# Patient Record
Sex: Male | Born: 1960 | Race: Black or African American | Hispanic: No | Marital: Married | Smoking: Current every day smoker
Health system: Southern US, Community
[De-identification: ages and names within clinical notes are randomized; demographics above are authoritative.]

## PROBLEM LIST (undated history)

## (undated) DIAGNOSIS — R55 Syncope and collapse: Secondary | ICD-10-CM

## (undated) DIAGNOSIS — R42 Dizziness and giddiness: Secondary | ICD-10-CM

## (undated) DIAGNOSIS — I639 Cerebral infarction, unspecified: Secondary | ICD-10-CM

## (undated) DIAGNOSIS — R0602 Shortness of breath: Secondary | ICD-10-CM

## (undated) DIAGNOSIS — I10 Essential (primary) hypertension: Secondary | ICD-10-CM

## (undated) HISTORY — DX: Shortness of breath: R06.02

## (undated) HISTORY — DX: Dizziness and giddiness: R42

## (undated) HISTORY — DX: Syncope and collapse: R55

## (undated) HISTORY — DX: Essential (primary) hypertension: I10

---

## 1998-03-15 ENCOUNTER — Emergency Department (HOSPITAL_COMMUNITY): Admission: EM | Admit: 1998-03-15 | Discharge: 1998-03-15 | Payer: Self-pay | Admitting: Emergency Medicine

## 1999-08-01 ENCOUNTER — Encounter: Payer: Self-pay | Admitting: Emergency Medicine

## 1999-08-08 ENCOUNTER — Encounter: Admission: RE | Admit: 1999-08-08 | Discharge: 1999-08-08 | Payer: Self-pay | Admitting: Internal Medicine

## 1999-12-14 ENCOUNTER — Emergency Department (HOSPITAL_COMMUNITY): Admission: EM | Admit: 1999-12-14 | Discharge: 1999-12-14 | Payer: Self-pay | Admitting: Emergency Medicine

## 1999-12-14 ENCOUNTER — Encounter: Payer: Self-pay | Admitting: Emergency Medicine

## 2000-09-18 ENCOUNTER — Encounter: Admission: RE | Admit: 2000-09-18 | Discharge: 2000-09-18 | Payer: Self-pay | Admitting: Internal Medicine

## 2000-10-06 ENCOUNTER — Emergency Department (HOSPITAL_COMMUNITY): Admission: EM | Admit: 2000-10-06 | Discharge: 2000-10-06 | Payer: Self-pay | Admitting: Emergency Medicine

## 2000-11-13 ENCOUNTER — Inpatient Hospital Stay (HOSPITAL_COMMUNITY): Admission: EM | Admit: 2000-11-13 | Discharge: 2000-11-21 | Payer: Self-pay | Admitting: *Deleted

## 2000-11-13 ENCOUNTER — Encounter: Payer: Self-pay | Admitting: Emergency Medicine

## 2000-11-14 ENCOUNTER — Encounter: Payer: Self-pay | Admitting: Nephrology

## 2000-11-15 ENCOUNTER — Encounter: Payer: Self-pay | Admitting: General Surgery

## 2000-11-18 ENCOUNTER — Encounter: Payer: Self-pay | Admitting: General Surgery

## 2000-11-19 ENCOUNTER — Encounter: Payer: Self-pay | Admitting: General Surgery

## 2000-11-30 ENCOUNTER — Inpatient Hospital Stay (HOSPITAL_COMMUNITY): Admission: EM | Admit: 2000-11-30 | Discharge: 2000-12-06 | Payer: Self-pay | Admitting: Emergency Medicine

## 2000-11-30 ENCOUNTER — Encounter: Payer: Self-pay | Admitting: Emergency Medicine

## 2000-12-01 ENCOUNTER — Encounter: Payer: Self-pay | Admitting: General Surgery

## 2000-12-02 ENCOUNTER — Encounter: Payer: Self-pay | Admitting: Surgery

## 2000-12-03 ENCOUNTER — Encounter: Payer: Self-pay | Admitting: General Surgery

## 2000-12-04 ENCOUNTER — Encounter: Payer: Self-pay | Admitting: General Surgery

## 2000-12-06 ENCOUNTER — Encounter: Payer: Self-pay | Admitting: General Surgery

## 2000-12-26 ENCOUNTER — Emergency Department (HOSPITAL_COMMUNITY): Admission: EM | Admit: 2000-12-26 | Discharge: 2000-12-26 | Payer: Self-pay

## 2002-04-26 ENCOUNTER — Emergency Department (HOSPITAL_COMMUNITY): Admission: EM | Admit: 2002-04-26 | Discharge: 2002-04-26 | Payer: Self-pay | Admitting: Emergency Medicine

## 2002-04-26 ENCOUNTER — Encounter: Admission: RE | Admit: 2002-04-26 | Discharge: 2002-04-26 | Payer: Self-pay | Admitting: Internal Medicine

## 2002-05-26 ENCOUNTER — Encounter: Admission: RE | Admit: 2002-05-26 | Discharge: 2002-05-26 | Payer: Self-pay | Admitting: Internal Medicine

## 2002-11-02 ENCOUNTER — Emergency Department (HOSPITAL_COMMUNITY): Admission: EM | Admit: 2002-11-02 | Discharge: 2002-11-02 | Payer: Self-pay | Admitting: Emergency Medicine

## 2003-02-10 ENCOUNTER — Emergency Department (HOSPITAL_COMMUNITY): Admission: AD | Admit: 2003-02-10 | Discharge: 2003-02-10 | Payer: Self-pay | Admitting: Family Medicine

## 2003-04-18 ENCOUNTER — Encounter: Admission: RE | Admit: 2003-04-18 | Discharge: 2003-04-18 | Payer: Self-pay | Admitting: Internal Medicine

## 2003-04-24 ENCOUNTER — Encounter: Admission: RE | Admit: 2003-04-24 | Discharge: 2003-04-24 | Payer: Self-pay | Admitting: Internal Medicine

## 2004-05-23 ENCOUNTER — Emergency Department (HOSPITAL_COMMUNITY): Admission: EM | Admit: 2004-05-23 | Discharge: 2004-05-23 | Payer: Self-pay | Admitting: Emergency Medicine

## 2004-10-31 ENCOUNTER — Emergency Department (HOSPITAL_COMMUNITY): Admission: EM | Admit: 2004-10-31 | Discharge: 2004-10-31 | Payer: Self-pay | Admitting: Emergency Medicine

## 2005-02-24 ENCOUNTER — Ambulatory Visit: Payer: Self-pay | Admitting: Internal Medicine

## 2005-03-06 ENCOUNTER — Ambulatory Visit: Payer: Self-pay | Admitting: Internal Medicine

## 2005-05-06 ENCOUNTER — Ambulatory Visit: Payer: Self-pay | Admitting: Internal Medicine

## 2005-05-12 ENCOUNTER — Emergency Department (HOSPITAL_COMMUNITY): Admission: EM | Admit: 2005-05-12 | Discharge: 2005-05-12 | Payer: Self-pay | Admitting: Family Medicine

## 2005-05-20 ENCOUNTER — Emergency Department (HOSPITAL_COMMUNITY): Admission: EM | Admit: 2005-05-20 | Discharge: 2005-05-20 | Payer: Self-pay | Admitting: Family Medicine

## 2005-05-30 ENCOUNTER — Ambulatory Visit: Payer: Self-pay | Admitting: Internal Medicine

## 2005-05-30 ENCOUNTER — Encounter (INDEPENDENT_AMBULATORY_CARE_PROVIDER_SITE_OTHER): Payer: Self-pay | Admitting: Internal Medicine

## 2005-05-30 LAB — CONVERTED CEMR LAB
Calcium: 10.1 mg/dL
Creatinine, Ser: 1.2 mg/dL
Glucose, Bld: 89 mg/dL

## 2006-03-01 ENCOUNTER — Emergency Department (HOSPITAL_COMMUNITY): Admission: EM | Admit: 2006-03-01 | Discharge: 2006-03-01 | Payer: Self-pay | Admitting: Emergency Medicine

## 2006-03-18 ENCOUNTER — Emergency Department (HOSPITAL_COMMUNITY): Admission: EM | Admit: 2006-03-18 | Discharge: 2006-03-18 | Payer: Self-pay | Admitting: Family Medicine

## 2006-04-01 ENCOUNTER — Emergency Department (HOSPITAL_COMMUNITY): Admission: EM | Admit: 2006-04-01 | Discharge: 2006-04-01 | Payer: Self-pay | Admitting: Emergency Medicine

## 2006-04-02 ENCOUNTER — Ambulatory Visit: Payer: Self-pay | Admitting: Internal Medicine

## 2006-04-02 ENCOUNTER — Ambulatory Visit: Payer: Self-pay | Admitting: *Deleted

## 2006-04-02 ENCOUNTER — Observation Stay (HOSPITAL_COMMUNITY): Admission: AD | Admit: 2006-04-02 | Discharge: 2006-04-03 | Payer: Self-pay | Admitting: Internal Medicine

## 2006-04-10 ENCOUNTER — Encounter (INDEPENDENT_AMBULATORY_CARE_PROVIDER_SITE_OTHER): Payer: Self-pay | Admitting: Internal Medicine

## 2006-04-15 ENCOUNTER — Telehealth (INDEPENDENT_AMBULATORY_CARE_PROVIDER_SITE_OTHER): Payer: Self-pay | Admitting: Internal Medicine

## 2006-04-15 ENCOUNTER — Inpatient Hospital Stay (HOSPITAL_COMMUNITY): Admission: EM | Admit: 2006-04-15 | Discharge: 2006-04-17 | Payer: Self-pay | Admitting: Nurse Practitioner

## 2006-04-17 ENCOUNTER — Encounter (INDEPENDENT_AMBULATORY_CARE_PROVIDER_SITE_OTHER): Payer: Self-pay | Admitting: Internal Medicine

## 2006-04-17 LAB — CONVERTED CEMR LAB
Anion Gap: 6
BUN: 9 mg/dL
CO2: 28 meq/L
Calcium: 9.5 mg/dL
Chloride: 103 meq/L
Creatinine, Ser: 1 mg/dL
Glucose, Bld: 80 mg/dL
Potassium: 3.6 meq/L
Sodium: 137 meq/L

## 2006-04-20 ENCOUNTER — Encounter (INDEPENDENT_AMBULATORY_CARE_PROVIDER_SITE_OTHER): Payer: Self-pay | Admitting: Internal Medicine

## 2006-04-20 DIAGNOSIS — I10 Essential (primary) hypertension: Secondary | ICD-10-CM | POA: Insufficient documentation

## 2006-04-20 DIAGNOSIS — F17200 Nicotine dependence, unspecified, uncomplicated: Secondary | ICD-10-CM | POA: Insufficient documentation

## 2006-04-20 DIAGNOSIS — F1010 Alcohol abuse, uncomplicated: Secondary | ICD-10-CM

## 2006-11-21 ENCOUNTER — Emergency Department (HOSPITAL_COMMUNITY): Admission: EM | Admit: 2006-11-21 | Discharge: 2006-11-21 | Payer: Self-pay | Admitting: Emergency Medicine

## 2007-03-08 ENCOUNTER — Telehealth (INDEPENDENT_AMBULATORY_CARE_PROVIDER_SITE_OTHER): Payer: Self-pay | Admitting: *Deleted

## 2007-10-12 ENCOUNTER — Emergency Department (HOSPITAL_COMMUNITY): Admission: EM | Admit: 2007-10-12 | Discharge: 2007-10-12 | Payer: Self-pay | Admitting: Emergency Medicine

## 2007-10-20 ENCOUNTER — Emergency Department (HOSPITAL_COMMUNITY): Admission: EM | Admit: 2007-10-20 | Discharge: 2007-10-20 | Payer: Self-pay | Admitting: Emergency Medicine

## 2007-11-25 ENCOUNTER — Emergency Department (HOSPITAL_COMMUNITY): Admission: EM | Admit: 2007-11-25 | Discharge: 2007-11-25 | Payer: Self-pay | Admitting: Emergency Medicine

## 2008-01-13 ENCOUNTER — Ambulatory Visit: Payer: Self-pay | Admitting: Family Medicine

## 2008-05-16 ENCOUNTER — Emergency Department (HOSPITAL_COMMUNITY): Admission: EM | Admit: 2008-05-16 | Discharge: 2008-05-17 | Payer: Self-pay | Admitting: Emergency Medicine

## 2008-06-23 ENCOUNTER — Emergency Department (HOSPITAL_COMMUNITY): Admission: EM | Admit: 2008-06-23 | Discharge: 2008-06-23 | Payer: Self-pay | Admitting: Emergency Medicine

## 2008-12-22 ENCOUNTER — Emergency Department (HOSPITAL_COMMUNITY): Admission: EM | Admit: 2008-12-22 | Discharge: 2008-12-22 | Payer: Self-pay | Admitting: Emergency Medicine

## 2009-02-19 ENCOUNTER — Emergency Department (HOSPITAL_COMMUNITY): Admission: EM | Admit: 2009-02-19 | Discharge: 2009-02-19 | Payer: Self-pay | Admitting: Emergency Medicine

## 2009-02-20 ENCOUNTER — Observation Stay (HOSPITAL_COMMUNITY): Admission: EM | Admit: 2009-02-20 | Discharge: 2009-02-22 | Payer: Self-pay | Admitting: Emergency Medicine

## 2009-02-20 ENCOUNTER — Ambulatory Visit: Payer: Self-pay | Admitting: Infectious Diseases

## 2009-02-20 ENCOUNTER — Ambulatory Visit: Payer: Self-pay | Admitting: Cardiology

## 2009-02-21 ENCOUNTER — Encounter: Payer: Self-pay | Admitting: Infectious Diseases

## 2009-02-22 ENCOUNTER — Encounter: Payer: Self-pay | Admitting: Internal Medicine

## 2009-03-27 ENCOUNTER — Ambulatory Visit: Payer: Self-pay | Admitting: Family Medicine

## 2009-03-27 LAB — CONVERTED CEMR LAB: Microalb, Ur: 6.22 mg/dL — ABNORMAL HIGH (ref 0.00–1.89)

## 2009-08-24 ENCOUNTER — Emergency Department (HOSPITAL_COMMUNITY): Admission: EM | Admit: 2009-08-24 | Discharge: 2009-08-24 | Payer: Self-pay | Admitting: Emergency Medicine

## 2009-09-05 ENCOUNTER — Inpatient Hospital Stay (HOSPITAL_COMMUNITY): Admission: EM | Admit: 2009-09-05 | Discharge: 2009-09-07 | Payer: Self-pay | Admitting: Emergency Medicine

## 2009-09-06 ENCOUNTER — Encounter (INDEPENDENT_AMBULATORY_CARE_PROVIDER_SITE_OTHER): Payer: Self-pay | Admitting: Internal Medicine

## 2009-10-17 ENCOUNTER — Inpatient Hospital Stay (HOSPITAL_COMMUNITY): Admission: EM | Admit: 2009-10-17 | Discharge: 2009-10-18 | Payer: Self-pay | Admitting: Emergency Medicine

## 2009-12-10 ENCOUNTER — Emergency Department (HOSPITAL_COMMUNITY): Admission: EM | Admit: 2009-12-10 | Discharge: 2009-12-11 | Payer: Self-pay | Admitting: Emergency Medicine

## 2009-12-19 ENCOUNTER — Emergency Department (HOSPITAL_COMMUNITY): Admission: EM | Admit: 2009-12-19 | Discharge: 2009-12-19 | Payer: Self-pay | Admitting: Emergency Medicine

## 2009-12-25 ENCOUNTER — Emergency Department (HOSPITAL_COMMUNITY): Admission: EM | Admit: 2009-12-25 | Discharge: 2009-12-26 | Payer: Self-pay | Admitting: Emergency Medicine

## 2009-12-26 ENCOUNTER — Emergency Department (HOSPITAL_COMMUNITY): Admission: EM | Admit: 2009-12-26 | Discharge: 2009-12-26 | Payer: Self-pay | Admitting: Emergency Medicine

## 2010-01-11 ENCOUNTER — Emergency Department (HOSPITAL_COMMUNITY): Admission: EM | Admit: 2010-01-11 | Discharge: 2010-01-11 | Payer: Self-pay | Admitting: Emergency Medicine

## 2010-06-05 LAB — COMPREHENSIVE METABOLIC PANEL
ALT: 77 U/L — ABNORMAL HIGH (ref 0–53)
AST: 81 U/L — ABNORMAL HIGH (ref 0–37)
Albumin: 3.5 g/dL (ref 3.5–5.2)
Alkaline Phosphatase: 78 U/L (ref 39–117)
BUN: 3 mg/dL — ABNORMAL LOW (ref 6–23)
BUN: 4 mg/dL — ABNORMAL LOW (ref 6–23)
CO2: 29 mEq/L (ref 19–32)
CO2: 29 mEq/L (ref 19–32)
Calcium: 8.9 mg/dL (ref 8.4–10.5)
Calcium: 9.5 mg/dL (ref 8.4–10.5)
Chloride: 99 mEq/L (ref 96–112)
Creatinine, Ser: 0.91 mg/dL (ref 0.4–1.5)
Creatinine, Ser: 0.94 mg/dL (ref 0.4–1.5)
GFR calc Af Amer: >60 mL/min (ref >60)
GFR calc non Af Amer: >60 mL/min (ref >60)
GFR calc non Af Amer: >60 mL/min (ref >60)
Glucose, Bld: 110 mg/dL — ABNORMAL HIGH (ref 70–99)
Glucose, Bld: 121 mg/dL — ABNORMAL HIGH (ref 70–99)
Glucose, Bld: 97 mg/dL (ref 70–99)
Potassium: 3.2 mEq/L — ABNORMAL LOW (ref 3.5–5.1)
Sodium: 135 mEq/L (ref 135–145)
Total Bilirubin: 0.8 mg/dL (ref 0.3–1.2)
Total Protein: 7.8 g/dL (ref 6.0–8.3)
Total Protein: 8.6 g/dL — ABNORMAL HIGH (ref 6.0–8.3)

## 2010-06-05 LAB — URINALYSIS, ROUTINE W REFLEX MICROSCOPIC
Glucose, UA: NEGATIVE mg/dL
Ketones, ur: NEGATIVE mg/dL
Leukocytes, UA: NEGATIVE
Nitrite: NEGATIVE
Protein, ur: 30 mg/dL — AB

## 2010-06-05 LAB — DIFFERENTIAL
Basophils Absolute: 0 10*3/uL (ref 0.0–0.1)
Basophils Relative: 0 % (ref 0–1)
Basophils Relative: 0 % (ref 0–1)
Basophils Relative: 1 % (ref 0–1)
Eosinophils Absolute: 0 10*3/uL (ref 0.0–0.7)
Eosinophils Absolute: 0 10*3/uL (ref 0.0–0.7)
Eosinophils Relative: 1 % (ref 0–5)
Eosinophils Relative: 1 % (ref 0–5)
Lymphocytes Relative: 32 % (ref 12–46)
Lymphocytes Relative: 33 % (ref 12–46)
Lymphs Abs: 1.1 10*3/uL (ref 0.7–4.0)
Lymphs Abs: 1.5 10*3/uL (ref 0.7–4.0)
Monocytes Absolute: 0.3 10*3/uL (ref 0.1–1.0)
Monocytes Absolute: 0.3 10*3/uL (ref 0.1–1.0)
Monocytes Relative: 11 % (ref 3–12)
Monocytes Relative: 10 % (ref 3–12)
Monocytes Relative: 9 % (ref 3–12)
Neutro Abs: 1.4 10*3/uL — ABNORMAL LOW (ref 1.7–7.7)
Neutro Abs: 1.9 10*3/uL (ref 1.7–7.7)
Neutrophils Relative %: 56 % (ref 43–77)
Neutrophils Relative %: 43 % (ref 43–77)
Neutrophils Relative %: 58 % (ref 43–77)
Neutrophils Relative %: 61 % (ref 43–77)

## 2010-06-05 LAB — CBC
HCT: 46.6 % (ref 39.0–52.0)
HCT: 43.4 % (ref 39.0–52.0)
HCT: 45.7 % (ref 39.0–52.0)
HCT: 46 % (ref 39.0–52.0)
Hemoglobin: 16.3 g/dL (ref 13.0–17.0)
MCH: 32.2 pg (ref 26.0–34.0)
MCH: 32.5 pg (ref 26.0–34.0)
MCH: 32.6 pg (ref 26.0–34.0)
MCH: 32.6 pg (ref 26.0–34.0)
MCHC: 35.9 g/dL (ref 30.0–36.0)
MCHC: 35.9 g/dL (ref 30.0–36.0)
MCV: 89.4 fL (ref 78.0–100.0)
MCV: 90.5 fL (ref 78.0–100.0)
MCV: 90.8 fL (ref 78.0–100.0)
Platelets: 212 10*3/uL (ref 150–400)
Platelets: 195 10*3/uL (ref 150–400)
RBC: 5.21 MIL/uL (ref 4.22–5.81)
RBC: 5 MIL/uL (ref 4.22–5.81)
RDW: 14.5 % (ref 11.5–15.5)
RDW: 14.6 % (ref 11.5–15.5)
WBC: 3 10*3/uL — ABNORMAL LOW (ref 4.0–10.5)
WBC: 3.3 10*3/uL — ABNORMAL LOW (ref 4.0–10.5)

## 2010-06-05 LAB — POCT I-STAT, CHEM 8
BUN: 5 mg/dL — ABNORMAL LOW (ref 6–23)
Calcium, Ion: 1.13 mmol/L (ref 1.12–1.32)
Chloride: 99 mEq/L (ref 96–112)
Chloride: 101 mEq/L (ref 96–112)
Creatinine, Ser: 1 mg/dL (ref 0.4–1.5)
Creatinine, Ser: 0.8 mg/dL (ref 0.4–1.5)
Glucose, Bld: 93 mg/dL (ref 70–99)
Glucose, Bld: 132 mg/dL — ABNORMAL HIGH (ref 70–99)
Hemoglobin: 15.6 g/dL (ref 13.0–17.0)
Potassium: 3.9 mEq/L (ref 3.5–5.1)
Sodium: 140 mEq/L (ref 135–145)

## 2010-06-05 LAB — LIPASE, BLOOD
Lipase: 21 U/L (ref 11–59)
Lipase: 24 U/L (ref 11–59)

## 2010-06-05 LAB — URINE MICROSCOPIC-ADD ON

## 2010-06-06 LAB — POCT I-STAT, CHEM 8
BUN: 6 mg/dL (ref 6–23)
Calcium, Ion: 1.12 mmol/L (ref 1.12–1.32)
HCT: 50 % (ref 39.0–52.0)
Hemoglobin: 17 g/dL (ref 13.0–17.0)
Sodium: 139 mEq/L (ref 135–145)
TCO2: 30 mmol/L (ref 0–100)
TCO2: 32 mmol/L (ref 0–100)

## 2010-06-06 LAB — DIFFERENTIAL
Basophils Absolute: 0 10*3/uL (ref 0.0–0.1)
Eosinophils Relative: 1 % (ref 0–5)
Lymphocytes Relative: 35 % (ref 12–46)
Neutro Abs: 1.2 10*3/uL — ABNORMAL LOW (ref 1.7–7.7)
Neutrophils Relative %: 49 % (ref 43–77)

## 2010-06-06 LAB — CBC
MCV: 94.6 fL (ref 78.0–100.0)
Platelets: 228 10*3/uL (ref 150–400)
RDW: 14.6 % (ref 11.5–15.5)
WBC: 2.4 10*3/uL — ABNORMAL LOW (ref 4.0–10.5)

## 2010-06-08 LAB — PHOSPHORUS: Phosphorus: 4.3 mg/dL (ref 2.3–4.6)

## 2010-06-08 LAB — POCT I-STAT, CHEM 8
Chloride: 104 mEq/L (ref 96–112)
Glucose, Bld: 126 mg/dL — ABNORMAL HIGH (ref 70–99)
HCT: 48 % (ref 39.0–52.0)
Hemoglobin: 16.3 g/dL (ref 13.0–17.0)
Potassium: 2.9 mEq/L — ABNORMAL LOW (ref 3.5–5.1)
Sodium: 140 mEq/L (ref 135–145)

## 2010-06-08 LAB — URINALYSIS, MICROSCOPIC ONLY
Glucose, UA: NEGATIVE mg/dL
Hgb urine dipstick: NEGATIVE
Ketones, ur: NEGATIVE mg/dL
Protein, ur: NEGATIVE mg/dL
Urobilinogen, UA: 1 mg/dL (ref 0.0–1.0)

## 2010-06-08 LAB — DIFFERENTIAL
Basophils Relative: 1 % (ref 0–1)
Eosinophils Absolute: 0.1 10*3/uL (ref 0.0–0.7)
Eosinophils Relative: 1 % (ref 0–5)
Lymphocytes Relative: 33 % (ref 12–46)
Lymphocytes Relative: 47 % — ABNORMAL HIGH (ref 12–46)
Lymphs Abs: 0.7 10*3/uL (ref 0.7–4.0)
Lymphs Abs: 1.1 10*3/uL (ref 0.7–4.0)
Lymphs Abs: 1.6 10*3/uL (ref 0.7–4.0)
Monocytes Relative: 6 % (ref 3–12)
Neutro Abs: 1.3 10*3/uL — ABNORMAL LOW (ref 1.7–7.7)
Neutrophils Relative %: 42 % — ABNORMAL LOW (ref 43–77)
Neutrophils Relative %: 53 % (ref 43–77)
Neutrophils Relative %: 61 % (ref 43–77)

## 2010-06-08 LAB — COMPREHENSIVE METABOLIC PANEL
ALT: 85 U/L — ABNORMAL HIGH (ref 0–53)
Alkaline Phosphatase: 55 U/L (ref 39–117)
CO2: 25 mEq/L (ref 19–32)
Calcium: 8.7 mg/dL (ref 8.4–10.5)
GFR calc non Af Amer: >60 mL/min (ref >60)
Glucose, Bld: 91 mg/dL (ref 70–99)
Sodium: 135 mEq/L (ref 135–145)

## 2010-06-08 LAB — CBC
HCT: 37.1 % — ABNORMAL LOW (ref 39.0–52.0)
Hemoglobin: 13 g/dL (ref 13.0–17.0)
Hemoglobin: 15.1 g/dL (ref 13.0–17.0)
MCHC: 35 g/dL (ref 30.0–36.0)
MCV: 95.8 fL (ref 78.0–100.0)
MCV: 96.1 fL (ref 78.0–100.0)
Platelets: 147 10*3/uL — ABNORMAL LOW (ref 150–400)
Platelets: 151 10*3/uL (ref 150–400)
RBC: 4.51 MIL/uL (ref 4.22–5.81)
RBC: 4.52 MIL/uL (ref 4.22–5.81)
WBC: 2.2 10*3/uL — ABNORMAL LOW (ref 4.0–10.5)
WBC: 3.5 10*3/uL — ABNORMAL LOW (ref 4.0–10.5)

## 2010-06-08 LAB — URINALYSIS, ROUTINE W REFLEX MICROSCOPIC
Hgb urine dipstick: NEGATIVE
Nitrite: NEGATIVE
Protein, ur: NEGATIVE mg/dL
Specific Gravity, Urine: 1.008 (ref 1.005–1.030)
Urobilinogen, UA: 0.2 mg/dL (ref 0.0–1.0)

## 2010-06-08 LAB — CARDIAC PANEL(CRET KIN+CKTOT+MB+TROPI)
CK, MB: 1.2 ng/mL (ref 0.3–4.0)
Relative Index: 1.3 (ref 0.0–2.5)
Relative Index: INVALID (ref 0.0–2.5)
Total CK: 72 U/L (ref 7–232)
Total CK: 87 U/L (ref 7–232)

## 2010-06-08 LAB — LIPID PANEL
LDL Cholesterol: 63 mg/dL (ref 0–99)
Triglycerides: 126 mg/dL (ref <150)

## 2010-06-08 LAB — RAPID URINE DRUG SCREEN, HOSP PERFORMED
Amphetamines: NOT DETECTED
Amphetamines: NOT DETECTED
Barbiturates: NOT DETECTED
Benzodiazepines: NOT DETECTED
Benzodiazepines: NOT DETECTED
Cocaine: NOT DETECTED
Opiates: POSITIVE — AB
Tetrahydrocannabinol: NOT DETECTED
Tetrahydrocannabinol: NOT DETECTED

## 2010-06-08 LAB — CK TOTAL AND CKMB (NOT AT ARMC)
Relative Index: INVALID (ref 0.0–2.5)
Total CK: 90 U/L (ref 7–232)

## 2010-06-08 LAB — HEPATITIS B SURFACE ANTIBODY,QUALITATIVE: Hep B S Ab: POSITIVE — AB

## 2010-06-08 LAB — HEPATITIS C ANTIBODY: HCV Ab: REACTIVE — AB

## 2010-06-08 LAB — LIPASE, BLOOD: Lipase: 105 U/L — ABNORMAL HIGH (ref 11–59)

## 2010-06-08 LAB — PROTIME-INR
INR: 0.98 (ref 0.00–1.49)
Prothrombin Time: 12.9 seconds (ref 11.6–15.2)

## 2010-06-08 LAB — HEMOGLOBIN A1C: Mean Plasma Glucose: 97 mg/dL (ref <117)

## 2010-06-08 LAB — GLUCOSE, CAPILLARY: Glucose-Capillary: 111 mg/dL — ABNORMAL HIGH (ref 70–99)

## 2010-06-08 LAB — MAGNESIUM
Magnesium: 1.7 mg/dL (ref 1.5–2.5)
Magnesium: 1.8 mg/dL (ref 1.5–2.5)
Magnesium: 1.9 mg/dL (ref 1.5–2.5)

## 2010-06-08 LAB — URINE CULTURE: Culture: NO GROWTH

## 2010-06-08 LAB — HEPATITIS B CORE ANTIBODY, TOTAL: Hep B Core Total Ab: NEGATIVE

## 2010-06-08 LAB — HEPATIC FUNCTION PANEL
ALT: 118 U/L — ABNORMAL HIGH (ref 0–53)
Albumin: 3.8 g/dL (ref 3.5–5.2)
Alkaline Phosphatase: 68 U/L (ref 39–117)
Total Protein: 8.2 g/dL (ref 6.0–8.3)

## 2010-06-08 LAB — POCT CARDIAC MARKERS: Myoglobin, poc: 38.9 ng/mL (ref 12–200)

## 2010-06-08 LAB — APTT: aPTT: 29 seconds (ref 24–37)

## 2010-06-09 LAB — DIFFERENTIAL
Eosinophils Absolute: 0.1 10*3/uL (ref 0.0–0.7)
Lymphocytes Relative: 47 % — ABNORMAL HIGH (ref 12–46)
Lymphs Abs: 1.9 10*3/uL (ref 0.7–4.0)
Monocytes Relative: 8 % (ref 3–12)
Neutrophils Relative %: 43 % (ref 43–77)

## 2010-06-09 LAB — COMPREHENSIVE METABOLIC PANEL
ALT: 101 U/L — ABNORMAL HIGH (ref 0–53)
Calcium: 8.5 mg/dL (ref 8.4–10.5)
Creatinine, Ser: 0.91 mg/dL (ref 0.4–1.5)
GFR calc Af Amer: >60 mL/min (ref >60)
Glucose, Bld: 87 mg/dL (ref 70–99)
Sodium: 136 mEq/L (ref 135–145)
Total Protein: 6.2 g/dL (ref 6.0–8.3)

## 2010-06-09 LAB — CBC
Hemoglobin: 13.2 g/dL (ref 13.0–17.0)
MCHC: 34.8 g/dL (ref 30.0–36.0)
RDW: 14.9 % (ref 11.5–15.5)

## 2010-06-09 LAB — TSH: TSH: 1.267 u[IU]/mL (ref 0.350–4.500)

## 2010-06-09 LAB — CARDIAC PANEL(CRET KIN+CKTOT+MB+TROPI)
Relative Index: 1.9 (ref 0.0–2.5)
Relative Index: INVALID (ref 0.0–2.5)
Total CK: 97 U/L (ref 7–232)

## 2010-06-10 LAB — POTASSIUM: Potassium: 3.3 mEq/L — ABNORMAL LOW (ref 3.5–5.1)

## 2010-06-10 LAB — URINALYSIS, ROUTINE W REFLEX MICROSCOPIC
Glucose, UA: NEGATIVE mg/dL
Ketones, ur: NEGATIVE mg/dL
Leukocytes, UA: NEGATIVE
Nitrite: NEGATIVE
Protein, ur: 100 mg/dL — AB
pH: 6.5 (ref 5.0–8.0)

## 2010-06-10 LAB — POCT I-STAT, CHEM 8
BUN: 8 mg/dL (ref 6–23)
Calcium, Ion: 1.09 mmol/L — ABNORMAL LOW (ref 1.12–1.32)
Chloride: 91 mEq/L — ABNORMAL LOW (ref 96–112)
Creatinine, Ser: 0.9 mg/dL (ref 0.4–1.5)
Creatinine, Ser: 0.9 mg/dL (ref 0.4–1.5)
Glucose, Bld: 102 mg/dL — ABNORMAL HIGH (ref 70–99)
Glucose, Bld: 146 mg/dL — ABNORMAL HIGH (ref 70–99)
HCT: 54 % — ABNORMAL HIGH (ref 39.0–52.0)
Hemoglobin: 16.3 g/dL (ref 13.0–17.0)
Hemoglobin: 18.4 g/dL — ABNORMAL HIGH (ref 13.0–17.0)
Potassium: 2.6 mEq/L — CL (ref 3.5–5.1)

## 2010-06-10 LAB — POCT CARDIAC MARKERS: Troponin i, poc: <0.05 ng/mL (ref 0.00–0.09)

## 2010-06-10 LAB — CBC
HCT: 49.1 % (ref 39.0–52.0)
Hemoglobin: 17.1 g/dL — ABNORMAL HIGH (ref 13.0–17.0)
MCV: 95.9 fL (ref 78.0–100.0)
RBC: 5.12 MIL/uL (ref 4.22–5.81)
WBC: 2.6 10*3/uL — ABNORMAL LOW (ref 4.0–10.5)

## 2010-06-10 LAB — HEPATIC FUNCTION PANEL
ALT: 167 U/L — ABNORMAL HIGH (ref 0–53)
AST: 204 U/L — ABNORMAL HIGH (ref 0–37)
Alkaline Phosphatase: 90 U/L (ref 39–117)
Bilirubin, Direct: 0.5 mg/dL — ABNORMAL HIGH (ref 0.0–0.3)
Indirect Bilirubin: 0.9 mg/dL (ref 0.3–0.9)

## 2010-06-10 LAB — ETHANOL: Alcohol, Ethyl (B): <5 mg/dL (ref 0–10)

## 2010-06-25 LAB — BASIC METABOLIC PANEL
BUN: 4 mg/dL — ABNORMAL LOW (ref 6–23)
BUN: 7 mg/dL (ref 6–23)
Calcium: 8.6 mg/dL (ref 8.4–10.5)
Creatinine, Ser: 0.87 mg/dL (ref 0.4–1.5)
GFR calc non Af Amer: >60 mL/min (ref >60)
GFR calc non Af Amer: >60 mL/min (ref >60)
Glucose, Bld: 85 mg/dL (ref 70–99)
Potassium: 3.1 mEq/L — ABNORMAL LOW (ref 3.5–5.1)

## 2010-06-25 LAB — LIPID PANEL
LDL Cholesterol: 85 mg/dL (ref 0–99)
Total CHOL/HDL Ratio: 3.7 RATIO
VLDL: 17 mg/dL (ref 0–40)

## 2010-06-25 LAB — CBC
HCT: 40.9 % (ref 39.0–52.0)
MCV: 95.8 fL (ref 78.0–100.0)
Platelets: 171 10*3/uL (ref 150–400)
RDW: 15.3 % (ref 11.5–15.5)

## 2010-06-25 LAB — CARDIAC PANEL(CRET KIN+CKTOT+MB+TROPI)
Relative Index: INVALID (ref 0.0–2.5)
Total CK: 86 U/L (ref 7–232)
Troponin I: 0.01 ng/mL (ref 0.00–0.06)

## 2010-06-26 LAB — CBC
HCT: 42.7 % (ref 39.0–52.0)
Hemoglobin: 15 g/dL (ref 13.0–17.0)
MCHC: 34.5 g/dL (ref 30.0–36.0)
MCV: 94.5 fL (ref 78.0–100.0)
Platelets: 180 10*3/uL (ref 150–400)
RBC: 4.51 MIL/uL (ref 4.22–5.81)
RBC: 4.88 MIL/uL (ref 4.22–5.81)
WBC: 3.5 10*3/uL — ABNORMAL LOW (ref 4.0–10.5)
WBC: 4.3 10*3/uL (ref 4.0–10.5)

## 2010-06-26 LAB — POCT I-STAT, CHEM 8
Chloride: 100 mEq/L (ref 96–112)
Creatinine, Ser: 1 mg/dL (ref 0.4–1.5)
Creatinine, Ser: 1 mg/dL (ref 0.4–1.5)
Glucose, Bld: 80 mg/dL (ref 70–99)
Glucose, Bld: 98 mg/dL (ref 70–99)
HCT: 49 % (ref 39.0–52.0)
Hemoglobin: 15.6 g/dL (ref 13.0–17.0)
Potassium: 3.1 mEq/L — ABNORMAL LOW (ref 3.5–5.1)
Sodium: 141 mEq/L (ref 135–145)
TCO2: 28 mmol/L (ref 0–100)

## 2010-06-26 LAB — COMPREHENSIVE METABOLIC PANEL
ALT: 47 U/L (ref 0–53)
AST: 31 U/L (ref 0–37)
Albumin: 4 g/dL (ref 3.5–5.2)
CO2: 26 mEq/L (ref 19–32)
Calcium: 9.4 mg/dL (ref 8.4–10.5)
Creatinine, Ser: 0.86 mg/dL (ref 0.4–1.5)
GFR calc Af Amer: >60 mL/min (ref >60)
GFR calc non Af Amer: >60 mL/min (ref >60)
Sodium: 136 mEq/L (ref 135–145)

## 2010-06-26 LAB — URINALYSIS, ROUTINE W REFLEX MICROSCOPIC
Glucose, UA: NEGATIVE mg/dL
Glucose, UA: NEGATIVE mg/dL
Hgb urine dipstick: NEGATIVE
Ketones, ur: 15 mg/dL — AB
Ketones, ur: NEGATIVE mg/dL
Nitrite: NEGATIVE
Protein, ur: NEGATIVE mg/dL
Urobilinogen, UA: 1 mg/dL (ref 0.0–1.0)
pH: 6.5 (ref 5.0–8.0)

## 2010-06-26 LAB — DIFFERENTIAL
Eosinophils Absolute: 0 10*3/uL (ref 0.0–0.7)
Eosinophils Absolute: 0.1 10*3/uL (ref 0.0–0.7)
Eosinophils Relative: 1 % (ref 0–5)
Eosinophils Relative: 2 % (ref 0–5)
Lymphocytes Relative: 43 % (ref 12–46)
Lymphs Abs: 1.2 10*3/uL (ref 0.7–4.0)
Lymphs Abs: 1.9 10*3/uL (ref 0.7–4.0)
Monocytes Absolute: 0.3 10*3/uL (ref 0.1–1.0)
Monocytes Absolute: 0.5 10*3/uL (ref 0.1–1.0)
Monocytes Relative: 10 % (ref 3–12)
Monocytes Relative: 11 % (ref 3–12)

## 2010-06-26 LAB — POCT CARDIAC MARKERS
CKMB, poc: 1.2 ng/mL (ref 1.0–8.0)
Myoglobin, poc: 53.8 ng/mL (ref 12–200)
Troponin i, poc: <0.05 ng/mL (ref 0.00–0.09)

## 2010-06-26 LAB — MICROALBUMIN / CREATININE URINE RATIO: Microalb, Ur: 8.42 mg/dL — ABNORMAL HIGH (ref 0.00–1.89)

## 2010-06-26 LAB — CARDIAC PANEL(CRET KIN+CKTOT+MB+TROPI)
Relative Index: 1.7 (ref 0.0–2.5)
Troponin I: 0.01 ng/mL (ref 0.00–0.06)

## 2010-06-26 LAB — RAPID URINE DRUG SCREEN, HOSP PERFORMED
Amphetamines: NOT DETECTED
Benzodiazepines: NOT DETECTED
Cocaine: NOT DETECTED
Tetrahydrocannabinol: NOT DETECTED

## 2010-06-26 LAB — GLUCOSE, CAPILLARY: Glucose-Capillary: 132 mg/dL — ABNORMAL HIGH (ref 70–99)

## 2010-06-27 LAB — COMPREHENSIVE METABOLIC PANEL
ALT: 199 U/L — ABNORMAL HIGH (ref 0–53)
AST: 132 U/L — ABNORMAL HIGH (ref 0–37)
Albumin: 4.4 g/dL (ref 3.5–5.2)
Alkaline Phosphatase: 69 U/L (ref 39–117)
GFR calc Af Amer: >60 mL/min (ref >60)
Potassium: 3.9 mEq/L (ref 3.5–5.1)
Sodium: 136 mEq/L (ref 135–145)
Total Protein: 8.7 g/dL — ABNORMAL HIGH (ref 6.0–8.3)

## 2010-06-27 LAB — POCT I-STAT, CHEM 8
Glucose, Bld: 95 mg/dL (ref 70–99)
HCT: 45 % (ref 39.0–52.0)
Hemoglobin: 15.3 g/dL (ref 13.0–17.0)
Potassium: 4 mEq/L (ref 3.5–5.1)
Sodium: 137 mEq/L (ref 135–145)
TCO2: 27 mmol/L (ref 0–100)

## 2010-06-27 LAB — URINE MICROSCOPIC-ADD ON

## 2010-06-27 LAB — DIFFERENTIAL
Basophils Relative: 0 % (ref 0–1)
Eosinophils Absolute: 0 10*3/uL (ref 0.0–0.7)
Eosinophils Relative: 0 % (ref 0–5)
Neutrophils Relative %: 79 % — ABNORMAL HIGH (ref 43–77)

## 2010-06-27 LAB — POCT CARDIAC MARKERS
CKMB, poc: 1.2 ng/mL (ref 1.0–8.0)
Myoglobin, poc: 79 ng/mL (ref 12–200)

## 2010-06-27 LAB — CBC
HCT: 42.7 % (ref 39.0–52.0)
MCHC: 34.7 g/dL (ref 30.0–36.0)
MCV: 91.4 fL (ref 78.0–100.0)
Platelets: 226 10*3/uL (ref 150–400)

## 2010-06-27 LAB — URINALYSIS, ROUTINE W REFLEX MICROSCOPIC
Glucose, UA: NEGATIVE mg/dL
Hgb urine dipstick: NEGATIVE
Ketones, ur: 40 mg/dL — AB
Leukocytes, UA: NEGATIVE
Protein, ur: 100 mg/dL — AB
Urobilinogen, UA: 1 mg/dL (ref 0.0–1.0)

## 2010-06-27 LAB — RAPID URINE DRUG SCREEN, HOSP PERFORMED
Amphetamines: NOT DETECTED
Barbiturates: NOT DETECTED
Tetrahydrocannabinol: NOT DETECTED

## 2010-07-03 LAB — COMPREHENSIVE METABOLIC PANEL
Albumin: 3.9 g/dL (ref 3.5–5.2)
Alkaline Phosphatase: 75 U/L (ref 39–117)
BUN: 6 mg/dL (ref 6–23)
Calcium: 9.7 mg/dL (ref 8.4–10.5)
Creatinine, Ser: 0.91 mg/dL (ref 0.4–1.5)
Glucose, Bld: 115 mg/dL — ABNORMAL HIGH (ref 70–99)
Potassium: 3.2 mEq/L — ABNORMAL LOW (ref 3.5–5.1)
Total Protein: 7.9 g/dL (ref 6.0–8.3)

## 2010-07-03 LAB — DIFFERENTIAL
Basophils Relative: 1 % (ref 0–1)
Lymphocytes Relative: 40 % (ref 12–46)
Lymphs Abs: 2.3 10*3/uL (ref 0.7–4.0)
Monocytes Absolute: 0.7 10*3/uL (ref 0.1–1.0)
Monocytes Relative: 12 % (ref 3–12)
Neutro Abs: 2.6 10*3/uL (ref 1.7–7.7)
Neutrophils Relative %: 45 % (ref 43–77)

## 2010-07-03 LAB — POCT CARDIAC MARKERS
CKMB, poc: 1.3 ng/mL (ref 1.0–8.0)
Myoglobin, poc: 41.6 ng/mL (ref 12–200)

## 2010-07-03 LAB — CBC
HCT: 46.9 % (ref 39.0–52.0)
Hemoglobin: 16.5 g/dL (ref 13.0–17.0)
MCHC: 35.1 g/dL (ref 30.0–36.0)
Platelets: 215 10*3/uL (ref 150–400)
RDW: 14.9 % (ref 11.5–15.5)

## 2010-07-03 LAB — GLUCOSE, CAPILLARY

## 2010-07-03 LAB — ETHANOL: Alcohol, Ethyl (B): <5 mg/dL (ref 0–10)

## 2010-07-03 LAB — RAPID URINE DRUG SCREEN, HOSP PERFORMED
Benzodiazepines: NOT DETECTED
Cocaine: NOT DETECTED
Opiates: NOT DETECTED

## 2010-07-06 ENCOUNTER — Emergency Department (HOSPITAL_COMMUNITY): Payer: Self-pay

## 2010-07-06 ENCOUNTER — Emergency Department (HOSPITAL_COMMUNITY)
Admission: EM | Admit: 2010-07-06 | Discharge: 2010-07-07 | Disposition: A | Discharge status: Home / Self Care | Payer: Self-pay | Attending: Emergency Medicine | Admitting: Emergency Medicine

## 2010-07-06 DIAGNOSIS — I10 Essential (primary) hypertension: Secondary | ICD-10-CM | POA: Insufficient documentation

## 2010-07-06 DIAGNOSIS — Z9119 Patient's noncompliance with other medical treatment and regimen: Secondary | ICD-10-CM | POA: Insufficient documentation

## 2010-07-06 DIAGNOSIS — F1010 Alcohol abuse, uncomplicated: Secondary | ICD-10-CM | POA: Insufficient documentation

## 2010-07-06 DIAGNOSIS — E876 Hypokalemia: Secondary | ICD-10-CM | POA: Insufficient documentation

## 2010-07-06 DIAGNOSIS — Z91199 Patient's noncompliance with other medical treatment and regimen due to unspecified reason: Secondary | ICD-10-CM | POA: Insufficient documentation

## 2010-07-06 DIAGNOSIS — R51 Headache: Secondary | ICD-10-CM | POA: Insufficient documentation

## 2010-07-06 LAB — CBC
MCH: 32.7 pg (ref 26.0–34.0)
MCV: 92 fL (ref 78.0–100.0)
Platelets: 170 10*3/uL (ref 150–400)
RDW: 15 % (ref 11.5–15.5)
WBC: 3 10*3/uL — ABNORMAL LOW (ref 4.0–10.5)

## 2010-07-06 LAB — COMPREHENSIVE METABOLIC PANEL
Albumin: 3.4 g/dL — ABNORMAL LOW (ref 3.5–5.2)
BUN: 5 mg/dL — ABNORMAL LOW (ref 6–23)
Calcium: 8.8 mg/dL (ref 8.4–10.5)
Glucose, Bld: 112 mg/dL — ABNORMAL HIGH (ref 70–99)
Sodium: 140 mEq/L (ref 135–145)
Total Protein: 7.7 g/dL (ref 6.0–8.3)

## 2010-07-06 LAB — RAPID URINE DRUG SCREEN, HOSP PERFORMED
Amphetamines: NOT DETECTED
Barbiturates: NOT DETECTED
Benzodiazepines: NOT DETECTED
Opiates: NOT DETECTED
Tetrahydrocannabinol: NOT DETECTED

## 2010-07-06 LAB — DIFFERENTIAL
Basophils Relative: 1 % (ref 0–1)
Eosinophils Absolute: 0 10*3/uL (ref 0.0–0.7)
Eosinophils Relative: 1 % (ref 0–5)
Lymphs Abs: 1.4 10*3/uL (ref 0.7–4.0)
Neutrophils Relative %: 38 % — ABNORMAL LOW (ref 43–77)

## 2010-07-06 LAB — ETHANOL: Alcohol, Ethyl (B): 373 mg/dL — ABNORMAL HIGH (ref 0–10)

## 2010-07-06 LAB — GLUCOSE, CAPILLARY: Glucose-Capillary: 102 mg/dL — ABNORMAL HIGH (ref 70–99)

## 2010-07-07 DIAGNOSIS — F1010 Alcohol abuse, uncomplicated: Secondary | ICD-10-CM

## 2010-08-09 NOTE — Op Note (Signed)
[place]. [facility]  Patient:    Austin Mendez, Austin Mendez Visit Number: [id number] MRN: [id number]          Service Type: MED Location: [phone] Attending Physician:  Trauma, Md Dictated by:   Adolph Pollack, M.D. Proc. Date: 11/30/00 Admit Date:  11/30/2000                             Operative Report  PREOPERATIVE DIAGNOSIS:  Left hydropneumothorax.  POSTOPERATIVE DIAGNOSIS:  Left hydropneumothorax.  OPERATION PERFORMED:  Left tube thoracostomy (32 [place]).  SURGEON:  Adolph Pollack, M.D.  ANESTHESIA:  INDICATIONS FOR PROCEDURE:  The patient is a 50 year old male admitted November 13, 2000 after a fall suffering rib fractures and a small pneumothorax that was treated expectantly.  He was discharged on August 31.  He has a three-day history of progressively increasing left chest pain and on chest x-ray has a left hydropneumothorax.  DESCRIPTION OF PROCEDURE:  He was given 2 mg of Versed for sedation and 4 mg IV morphine.  He was placed on a blood pressure monitor with continuous pulse oximetry.  He was then placed in a supine position and the left chest wall was sterilely prepped and draped.  A local anesthetic consisted of plain lidocaine was infiltrated in the superficial and subcutaneous tissues.  Then the left lateral chest wall at the anterior axillary line, an incision was made.  The eighth rib was identified and a tunnel created over to the seventh intercostal space.  The pleural cavity was entered with a rush of air and serosanguineous nonpurulent-appearing fluid evacuated.  A size 32 chest tube was then placed and anchored to the skin with silk suture.  It was hooked up to suction. Sterile dressing was applied.  The patient tolerated the procedure well without any apparent complications. A portable chest x-ray was pending. Dictated by:   Adolph Pollack, M.D. Attending Physician:  Trauma, Md DD:  11/30/00 TD:  11/30/00 Job:  [id number]

## 2010-08-09 NOTE — Discharge Summary (Signed)
[place]. [facility]  Patient:    Austin Mendez, Austin Mendez Visit Number: [id number] MRN: [id number]          Service Type: MED Location: [phone] Attending Physician:  Garnetta Buddy Dictated by:   Eugenia Pancoast, P.A. Admit Date:  11/13/2000 Disc. Date: 11/21/00                             Discharge Summary  DISCHARGE DIAGNOSES: 1. Fall. 2. Multiple left rib fractures. 3. Mild right knee injury. 4. Alcohol abuse. 5. Pneumonia. 6. Subcutaneous emphysema.  HISTORY OF PRESENT ILLNESS:  This is a 50 year old gentleman who states he fell through a weakened floor to the floor below which is approximately 10 feet.  At the time of admission, he complains of pain in back and in lower portion of left rib area.  The patient has a history of ETOH abuse.  He also has a history of hypertension.  He was subsequently worked up and noted to have multiple left-sided rib fractures, some of which were segmental.  He had cervical spondylosis without acute abnormality.  He had some mild subcutaneous emphysema.  He also has an infiltrate noted.  HOSPITAL COURSE:  He was subsequently admitted and started on antibiotics.  He was put on thiamine and multivitamins for the ETOH history.  He was given albuterol inhalation treatments.  He was started on Tequin.  He was also on Librium p.r.n. and oxycodone.  He showed improvement over the ensuing days. No untoward events occurred during his stay.  He was subsequently prepared for discharge on November 21, 2000.  At that time, he was doing satisfactorily.  He was up and ambulating without difficulty.  Pain was well-controlled.  DISCHARGE MEDICATIONS: 1. Vicodin one to two p.o. q.4-6h. p.r.n. pain. 2. Z-pack to continue as outpatient.  FOLLOWUP:  Follow up with the trauma clinic on Tuesday, September 10, at 9 a.m.  CONDITION ON DISCHARGE:  Discharged in satisfactory and stable condition on November 21, 2000. Dictated by:    Eugenia Pancoast, P.A. Attending Physician:  Garnetta Buddy DD:  11/21/00 TD:  11/21/00 Job: [id number]

## 2010-08-09 NOTE — Discharge Summary (Signed)
NAME:  Austin Mendez, Austin Mendez NO.:  [id number]   MEDICAL RECORD NO.:  [id number]          PATIENT TYPE:  OBV   LOCATION:  3737                         FACILITY:  [facility]   PHYSICIAN:  Thereasa Solo, M.D.  DATE OF BIRTH:  01/20/1961   DATE OF ADMISSION:  04/02/2006  DATE OF DISCHARGE:  04/03/2006                               DISCHARGE SUMMARY   PRIMARY CARE PHYSICIAN:  Dr. Okey Dupre.   DISCHARGE DIAGNOSIS:  Hypertensive urgency with initial blood pressure  on admission of 195/120.   DISCHARGE MEDICATIONS:  1. Lisinopril 40 mg daily.  2. Hydrochlorothiazide 25 mg daily.   DISPOSITION AND FOLLOWUP:  Patient is to follow up with Dr. Okey Dupre in  approximately 2 weeks time to verify that this addition of lisinopril  and hydrochlorothiazide has helped control the patient's blood pressure.  Also, please follow up on a BMET at that time to determine if the  creatinine has had any significant changes and if the patient can  continue on this high dose of lisinopril 40.  Also, please check if the  patient has any headaches or blurry vision or other symptoms of his  elevated blood pressure.  If his blood pressure is still remaining  elevated, he may wish to add in another medication.  Please be aware  that cost is a major issue for this patient and please attempt to choose  one off the 4 dollar list at [facility].  The patient may receive Medicaid  in the near future and you may ask him if he is scheduled to receive  this and then adjust your medication accordingly.   PROCEDURE PERFORMED:  CT of the head April 01, 2006, was negative  noncontrast head CT.   BRIEF HISTORY AND PHYSICAL:  This is a 51 year old African-American man  who presents to the outpatient clinic complaining of a headache with  dizziness and blurry vision that is located over the left temporal area  and behind the bilateral eyes, that has been present for approximately 2  to 3 weeks, and comes on intermittently  without any precipitating  causes.  There is no loss of vision, no jaw claudication.   Patient believes that his symptoms are due to hypertension and he says  he feels this way when his blood pressure is elevated and this has been  a chronic condition.  Patient is unemployed since September 2007 and,  therefore, he does not have money to pay for his medications.  He was  previously on Norvasc 10, lisinopril 40, and clonidine 0.1 b.i.d. as far  as I can tell from previous clinic visits.   There is no fever or chills, no chest pain or shortness of breath, no  diarrhea or constipation.   Patient was seen in the emergency department one day prior to admission  on April 01, 2006 for similar symptoms with blood pressure initially at  200/125 then going to 166/118 > 206/133 > 177/104 > 158/94 > 169/102.   ALLERGIES:  NO KNOWN DRUG ALLERGIES.   PAST MEDICAL HISTORY:  Significant for:  1. Hypertension, last clinic visit May 30, 2005.  Patient's  blood      pressure was 189/105.  On clinic visit May 06, 2005 blood      pressure was 177/113.   HOME MEDICATION:  1. Clonidine 0.1 mg b.i.d. (which the patient was just given in the      emergency department one day prior to admission).   SOCIAL HISTORY:  Patient claims he drinks approximately a 6 pack of beer  every day.   FAMILY HISTORY:  Mother with CVA and MI in her 31s as well as diabetes.  Father with hypertension and cancer of the throat.  Siblings:  Patient  has a sister with hypertension.   PHYSICAL EXAMINATION:  VITAL SIGNS:  Temperature 98.0, blood pressure  195/120, pulse 64, respiratory rate 15.  GENERAL:  No acute distress.  EYES:  Question of mild papilledema on left funduscopic exam.  Right is  within normal limits.  ENT:  Oropharynx is clear.  Moist mucous membranes.  No pain to temporal  artery palpation.  NECK:  Supple with full range of motion.  No thyromegaly.  RESPIRATORY:  Mild wheezing in bilateral lower  quadrants, otherwise  clear to auscultation bilaterally with good air movement.  CARDIOVASCULAR:  Regular rate and rhythm without murmur, no JVD, no  carotid bruits.  Question of S4.  GI:  Soft, nontender, nondistended with positive bowel sounds.  EXTREMITIES:  No edema, 2+ pulses.  GU:  No CVA tenderness to palpation.  SKIN:  Warm and dry without rash.  No lymphadenopathy.  MUSCULOSKELETAL:  Full range of motion.  NEURO:  Cranial nerves II-XII are intact, 5/5 strength in all 4  extremities, sensation is intact to light touch.  Patient is ambulating  appropriately.  PSYCH:  Patient was cage positive for 3/4 questions.  He does not feel  guilty.   LABORATORY DATA:  Pending.   HOSPITAL COURSE:  1. Hypertension.  Initial blood pressure was 195/120 on admission.      The patient's systolic blood pressure has been greater than 175      throughout his clinic visit since 2007 and therefore does not      elevated above his baseline; however, he may be symptomatic.  We started the patient on labetalol 20 mg IV q.1 hours p.r.n. systolic  blood pressure greater than 180.  We also started the patient on  clonidine 0.2 mg p.o. b.i.d. We checked blood pressures q.4 hours,  checked a UDS, a UA; both of which were negative.  Checked a BMET and  CBC, all of which were mostly normal.  It is likely that the patient's  headache, nausea, vomiting, eye blurriness are all due to the patient's  elevated blood pressure.  A main goal of this visit would be to provide  the patient with appropriate outpatient p.o. therapy.  This was best  accomplished, we felt, by choosing medications off the 4 dollar list,  therefore, the patient could afford them.  We decided to go with  lisinopril 40 mg daily, and hydrochlorothiazide 25 mg daily.  I gave the  patient 1 month worth of samples of these medications and also wrote him  for a prescription where he can take the combination medication  of lisinopril/hydrochlorothiazide 20/12.5 and take 2 of these everyday.  This would be 8 dollars on the [facility] list.  Lisinopril 40 mg is not  listed on [facility] 4 dollar list.   1. Headache.  There is a long differential diagnosis for the      patient's' headache including  primary headaches of cluster,      migraine, and tension headaches, as well as secondary headaches      including vascular with hypertension, subarachnoid hemorrhage,      infection, brain tumor, hydrocephalus, trigeminal neuralgia.  Most      likely there is overwhelming evidence that the patient's headache      is secondary to his hypertensive urgency and the patient has had      these symptoms on and off for approximately 1 year or more and he      only gets these symptoms when he claims his blood pressure is      elevated.  We did check ESR on this patient, which was normal and      we considered another CT of his head if his conditioned worsened,      although he improved substantially with medical treatment.  Also,      previous CT on January 9 in the emergency department was negative.   1. History of alcohol abuse.  Patient was cage positive to 3/4      questions.  He claims to drink approximately a 6 pack of beer      everyday.  We did place him on the CIWA protocol at this time.      Also gave him thiamin and folate.  We do not need to do any      benzodiazepine during this visit.  The patient remained completely      stable.  I did consider advising rehab in this patient, although I      did not feel he would be receptive to this request on such short      stay in the hospital and I think the primary goal of my visit was      to control his blood pressure.  On follow up with primary care      physician, if you could assess his substance abuse over time, it      may be more beneficial.   DISCHARGE VITALS:  Temperature 98.2, blood pressure 139/80, pulse 65, O2  sats 98% on room air.   White count 4.4,  hemoglobin 14.6, platelets 214, sodium 138, potassium  3.7, chloride 102, bicarb 29, BUN 7, creatinine 1.0, glucose 90.      Thereasa Solo, M.D.  Electronically Signed     AS/MEDQ  D:  04/03/2006  T:  04/04/2006  Job:  [id number]   cc:   Yvonne Kendall, M.D.

## 2010-08-09 NOTE — Discharge Summary (Signed)
[place]. [facility]  Patient:    GRAYER, SPROLES Visit Number: [id number] MRN: [id number]          Service Type: EMS Location: [place] Attending Physician:  Pearletha Alfred Dictated by:   Shawn Rayburn, P.A. Admit Date:  12/26/2000 Discharge Date: 12/26/2000                             Discharge Summary  ADMITTING TRAUMA SURGEON:  Adolph Pollack, M.D.  DISCHARGE DIAGNOSES: 1. Status post fall. 2. Left hydropneumothorax. 3. Multiple left rib fractures. 4. Hypertension. 5. ETOH abuse.  PROCEDURES:  Chest tube insertion and subsequent removal.  BRIEF HISTORY:  This is a 50 year old black male who had previously been admitted on November 13, 2000, following a fall with multiple left-sided rib fractures.  He did have a small left pneumothorax noted at this time and this was treated conservatively with serial chest x-ray and monitoring of the patients status.  The patient did have pneumonia while in the hospital.  He subsequently was able to be discharged on November 21, 2000, with improved status.   He reported to the emergency room on November 30, 2000, with a 3-day history of increased left-sided chest pain and dyspnea.  Chest x-ray at this time showed left-sided hydropneumothorax.  HOSPITAL COURSE:  The patient underwent chest tube insertion per Dr. Abbey Chatters without difficulty.  Serial chest x-rays were followed and showed a decreased stable left-sided effusion.  He had a less than 5% pneumothorax by the time of discharge.  He had his chest tube removed on December 04, 2000. He was discharged home in stable and improved condition on December 06, 2000.  DISCHARGE MEDICATIONS:  Tylox 1 to 2 p.o. q.4-6h. p.r.n. pain.  ACTIVITIES:  As tolerated.  FOLLOWUP:  He is to follow up with trauma service on December 08, 2000. Dictated by:   Shawn Rayburn, P.A. Attending Physician:  Pearletha Alfred DD:  02/03/01 TD:  02/03/01 Job: [id number]

## 2010-08-09 NOTE — Discharge Summary (Signed)
NAME:  Austin Mendez, Austin Mendez NO.:  [id number]   MEDICAL RECORD NO.:  [id number]          PATIENT TYPE:  INP   LOCATION:  2006                         FACILITY:  [facility]   PHYSICIAN:  Lacretia Leigh. Hatcher, M.D.DATE OF BIRTH:  [birth year]   DATE OF ADMISSION:  04/15/2006  DATE OF DISCHARGE:  04/17/2006                               DISCHARGE SUMMARY   DISCHARGE DIAGNOSIS:  1. Hypertensive urgency.  2. Hypokalemia likely secondary to hydrochlorothiazide.  3. Chronic ETOH abuse.  4. Tobacco abuse.   DISCHARGE MEDICATIONS:  Triamterine/hydrochlorothiazide 37.2/25 mg one  pill once a day.  He was given a prescription on the 25th.  He is also  to take lisinopril 40 mg; it should be 2 pills because he was given 20  mg pills, by mouth once a day.   DISPOSITION AND FOLLOWUP UPON DISCHARGE:  Austin Mendez's blood pressure  had decreased from that of admission, though it was still elevated with  readings as high as 158/108 and this was before taking his medications  and he was also not switched on his medications.  He also was still  having hypokalemia prompting the change in his medications from a  hydrochlorothiazide and lisinopril to the addition to triamterine, too  with hydrochlorothiazide.  He has an appointment with Dr. Okey Dupre in the  outpatient clinic on April 20, 2006 at 9 a.m.  This was a prescheduled  appointment based on a prior admission for similar symptoms.  He had a  STAT BMP that he is going to have done an hour before his appointment so  hopefully he will have his potassium back by the time he is seen so it  can be determined whether he needs to stay on the  triamterine/hydrochlorothiazide with lisinopril or if he can have a  different medication combo.  His blood pressure should also be  addressed.  He has a long history of hypertension that has been  difficult to control; however, he has never been on a consistent  medication regime as he usually goes to urgent  care centers where he is  given multiple different medications and he never has an equivalent  course of any of them.  It should also be considered that his elevated  blood pressure is secondary to his continued ETOH abuse and tobacco  abuse and his need to cut back on alcohol should be addressed during  this clinic appointment and while it does not need to be done during  this appointment, if his blood pressure remains elevated while being on  a stable course of antihypertensives, with him being able to cut back on  his alcohol use and smoking abuse, then a further workup of his  hypertension can be performed.   No procedures or consultations were obtained during this  hospitalization.   BRIEF ADMITTING HISTORY AND PHYSICAL:  Austin Mendez is a 50 year old  male with a recent admission for hypertensive urgency from January 10-  11, 2008 with initial blood pressure of 195/120 along with a history of  alcohol abuse and a distant history of traumatic fall through a weakened  floor resulting  in multiple rib fractures and a left hydropneumothorax.  After his discharge, he continued to have episodes of lightheadedness  and what he called swimmyheadedeness for 2-[redacted] weeks along with nausea and  vomiting the day of admission.  He says sometimes he can feel that his  blood pressure is elevated and has identified these symptoms in the past  and has prompted visits to the urgent care centers.  He says that he  thinks he has been taking his antihypertensive medications which include  40 lisinopril and 25 mg of hydrochlorothiazide every day, however, he  cannot remember which ones are which specifically.  He said that he took  his 3 pills the morning of admission and when his blood pressure still  got elevated with his symptoms, he took 3 additional pills of his blood  pressure medications, though he does not remember which one was which.  He denies chest pain, shortness of breath, palpitations or  headache,  though he did have 3-5 episodes of vomiting food and liquid content that  he had recently ingested the day of admission.   PHYSICAL EXAMINATION:  VITAL SIGNS:  Temperature 97.3, blood pressure  was 182/123, pulse 87, respirations 22, O2 sat 100% on room air.  GENERAL:  He is a middle-aged man in no distress with no JVD.  No  carotid bruits.  Good air movement.  LUNGS:  Clear to auscultation bilaterally.  CARDIOVASCULAR:  Regular rate and rhythm.  S1, S2 no murmurs.  ABDOMEN:  Bowel sounds positive.  No tenderness, rebound or guarding.  EXTREMITIES:  Within normal limits.  SKIN:  Warm and dry and unremarkable.  EXTREMITIES:  He is moving all 4 extremities equally.  NEURO:  Nonfocal.   LABORATORY DATA:  Sodium 137, potassium 3.5, chloride 104, BUN 7,  creatinine 1.1, glucose 102, hemoglobin 17.3, white blood cell count  5.4, platelets 183.  First set of cardiac markers had a troponin of less  than 0.05.  UA was negative and UDS was negative.   For a more detailed history and physical, please refer to the chart.   HOSPITAL COURSE PROBLEM BY PROBLEM:  1. Hypertensive urgency:  He was given labetalol 20 IV once in the ED      to which his blood pressure responded almost instantaneously.  He      was then put back on his home doses of medications of      hydrochlorothiazide 25 and lisinopril 40 once a day to which his      blood pressure instantly responded subsequently to 157/92 and then      141/91 with readings between 141 and 158/86 to 108.  While he was      still hypertensive, he was not nearly as much so as he was when he      presented into the emergency department making it seem more likely      that this was secondary to medication noncompliance as opposed to      his hypertension being uncontrolled.  However, he had not been on      his hydrochlorothiazide for more than 1 month so it is possible      that we have not seen the full effect of his  hydrochlorothiazide.     With his blood pressure returning back to his baseline and his      being asymptomatic, it was deemed that he could be discharged with      his followup appointment which was to be  within the next 3 days      where he could have a repeat BMP performed and have his blood      pressure reassessed and return if he needs any changes in his      medications.  Triamterine was added to his hydrochlorothiazide      because of his hypokalemia and if his blood pressure does not      remain under control for 1 month, will consider adding a third      medication or increasing one of his current medications, bearing in      mind that medication compliance and cost issues are a major factor      in which medication should be issued.  2. Hypokalemia:  Potassium going down as far as 3.1 and while it could      be secondary to his hydrochlorothiazide use, it is also possible      that it was secondary to his emesis of which he had 5 episodes      prior to admission, thus no changes in medications were made on the      first day of hospitalization, but when his potassium remained low      at 3.3 one day after having his hematic episodes and his being able      to tolerate p.o. intake, it was decided that it would be safer to      start him on triamterine than hydrochlorothiazide because of the      insubstantial increase in cost.  3. Continued alcohol abuse.  He says he drinks at least 48 ounces of      beer a day, 7 days a week.  He was advised that this is a very      possible contributor to his hypertension and that if he wishes to      not have continued symptoms, he should at least cut back, if not      cease completely his alcohol use.  He said while he did not need      any specific counseling for this, he was agreeable that he did need      to cut back on his alcohol and agreed that he would make a good      attempt to.  4. Tobacco abuse.  He was given a patch while he was  in the hospital      and today he thinks he would be able to quit smoking on his own and      it is something that he would be interested in trying and if he is      unable to, he will consider trying medications to assist.  That can      be determined as an outpatient.   LABS ON DISCHARGE:  He had a sodium of 137, potassium 3.6, chloride 103,  bicarb 28, glucose 80, BUN 9, creatinine 1, calcium 9.5.  Magnesium was  1.8, his troponin was negative x3 over 24 hours.  His blood pressure was  158/108.  Additional readings showing 142/86, temperature 97.3, pulse  68, respirations 20, O2 98% on room air.  His weight was 69.7 kg.      Valetta Close, M.D.       Lacretia Leigh. Ninetta Lights, M.D.     JC/MEDQ  D:  04/19/2006  T:  04/19/2006  Job:  [id number]   cc:   Outpatient Clinic  Yvonne Kendall, M.D.

## 2010-10-24 ENCOUNTER — Emergency Department (HOSPITAL_COMMUNITY): Payer: Self-pay

## 2010-10-24 ENCOUNTER — Emergency Department (HOSPITAL_COMMUNITY)
Admission: EM | Admit: 2010-10-24 | Discharge: 2010-10-24 | Discharge status: Against Medical Advice | Payer: Self-pay | Attending: Emergency Medicine | Admitting: Emergency Medicine

## 2010-10-24 DIAGNOSIS — I10 Essential (primary) hypertension: Secondary | ICD-10-CM | POA: Insufficient documentation

## 2010-10-24 DIAGNOSIS — R51 Headache: Secondary | ICD-10-CM | POA: Insufficient documentation

## 2010-10-24 DIAGNOSIS — S0100XA Unspecified open wound of scalp, initial encounter: Secondary | ICD-10-CM | POA: Insufficient documentation

## 2010-11-04 ENCOUNTER — Observation Stay (HOSPITAL_COMMUNITY)
Admission: EM | Admit: 2010-11-04 | Discharge: 2010-11-04 | Disposition: A | Discharge status: Home / Self Care | Payer: Self-pay | Attending: Emergency Medicine | Admitting: Emergency Medicine

## 2010-11-04 DIAGNOSIS — F17200 Nicotine dependence, unspecified, uncomplicated: Secondary | ICD-10-CM | POA: Insufficient documentation

## 2010-11-04 DIAGNOSIS — F1020 Alcohol dependence, uncomplicated: Secondary | ICD-10-CM | POA: Insufficient documentation

## 2010-11-04 DIAGNOSIS — R112 Nausea with vomiting, unspecified: Secondary | ICD-10-CM | POA: Insufficient documentation

## 2010-11-04 DIAGNOSIS — I10 Essential (primary) hypertension: Secondary | ICD-10-CM | POA: Insufficient documentation

## 2010-11-04 DIAGNOSIS — E860 Dehydration: Principal | ICD-10-CM | POA: Insufficient documentation

## 2010-11-04 LAB — GLUCOSE, CAPILLARY
Glucose-Capillary: 69 mg/dL — ABNORMAL LOW (ref 70–99)
Glucose-Capillary: 62 mg/dL — ABNORMAL LOW (ref 70–99)

## 2010-11-04 LAB — COMPREHENSIVE METABOLIC PANEL
BUN: 12 mg/dL (ref 6–23)
Calcium: 10.4 mg/dL (ref 8.4–10.5)
GFR calc Af Amer: >60 mL/min (ref >60)
Glucose, Bld: 105 mg/dL — ABNORMAL HIGH (ref 70–99)
Total Protein: 8.8 g/dL — ABNORMAL HIGH (ref 6.0–8.3)

## 2010-11-04 LAB — URINALYSIS, ROUTINE W REFLEX MICROSCOPIC
Nitrite: POSITIVE — AB
Protein, ur: 30 mg/dL — AB
Urobilinogen, UA: 1 mg/dL (ref 0.0–1.0)

## 2010-11-04 LAB — DIFFERENTIAL
Basophils Absolute: 0 10*3/uL (ref 0.0–0.1)
Basophils Relative: 0 % (ref 0–1)
Eosinophils Absolute: 0 10*3/uL (ref 0.0–0.7)
Monocytes Absolute: 0.3 10*3/uL (ref 0.1–1.0)
Monocytes Relative: 8 % (ref 3–12)
Neutrophils Relative %: 65 % (ref 43–77)

## 2010-11-04 LAB — CBC
MCH: 32.8 pg (ref 26.0–34.0)
MCHC: 36.2 g/dL — ABNORMAL HIGH (ref 30.0–36.0)
Platelets: 130 10*3/uL — ABNORMAL LOW (ref 150–400)
RBC: 4.79 MIL/uL (ref 4.22–5.81)

## 2010-11-04 LAB — LIPASE, BLOOD: Lipase: 21 U/L (ref 11–59)

## 2010-11-04 LAB — ETHANOL: Alcohol, Ethyl (B): <11 mg/dL (ref 0–11)

## 2010-11-04 LAB — URINE MICROSCOPIC-ADD ON

## 2010-12-20 LAB — CBC
HCT: 48.1
Hemoglobin: 16.6
MCHC: 34.6
MCV: 94.1
RBC: 5.11

## 2010-12-20 LAB — POCT I-STAT, CHEM 8
Calcium, Ion: 1.1 — ABNORMAL LOW
Chloride: 102
Creatinine, Ser: 1.1
Creatinine, Ser: 1.2
Glucose, Bld: 160 — ABNORMAL HIGH
Glucose, Bld: 82
HCT: 51
HCT: 53 — ABNORMAL HIGH
Hemoglobin: 17.3 — ABNORMAL HIGH
Potassium: 4.4

## 2010-12-20 LAB — DIFFERENTIAL
Basophils Relative: 0
Eosinophils Absolute: 0
Monocytes Absolute: 0.2
Monocytes Relative: 5

## 2010-12-20 LAB — RAPID URINE DRUG SCREEN, HOSP PERFORMED: Barbiturates: NOT DETECTED

## 2010-12-20 LAB — URINE CULTURE
Colony Count: NO GROWTH
Culture: NO GROWTH

## 2010-12-20 LAB — URINALYSIS, ROUTINE W REFLEX MICROSCOPIC
Nitrite: POSITIVE — AB
Specific Gravity, Urine: 1.034 — ABNORMAL HIGH
pH: 5

## 2010-12-20 LAB — URINE MICROSCOPIC-ADD ON

## 2011-01-03 LAB — CBC
Hemoglobin: 12.3 — ABNORMAL LOW
MCHC: 35
MCV: 91.8
RDW: 16.7 — ABNORMAL HIGH

## 2011-01-03 LAB — COMPREHENSIVE METABOLIC PANEL
ALT: 23
Calcium: 9.3
Creatinine, Ser: 1.31
GFR calc Af Amer: >60
GFR calc non Af Amer: 59 — ABNORMAL LOW
Glucose, Bld: 81
Sodium: 136
Total Protein: 7.8

## 2011-01-03 LAB — DIFFERENTIAL
Eosinophils Absolute: 0
Lymphocytes Relative: 26
Lymphs Abs: 2.5
Monocytes Relative: 6
Neutrophils Relative %: 67

## 2011-01-18 ENCOUNTER — Emergency Department (HOSPITAL_COMMUNITY): Payer: Self-pay

## 2011-01-18 ENCOUNTER — Emergency Department (HOSPITAL_COMMUNITY)
Admission: EM | Admit: 2011-01-18 | Discharge: 2011-01-18 | Disposition: A | Discharge status: Home / Self Care | Payer: Self-pay | Attending: Emergency Medicine | Admitting: Emergency Medicine

## 2011-01-18 DIAGNOSIS — R1013 Epigastric pain: Secondary | ICD-10-CM | POA: Insufficient documentation

## 2011-01-18 DIAGNOSIS — I10 Essential (primary) hypertension: Secondary | ICD-10-CM | POA: Insufficient documentation

## 2011-01-18 DIAGNOSIS — G319 Degenerative disease of nervous system, unspecified: Secondary | ICD-10-CM | POA: Insufficient documentation

## 2011-01-18 DIAGNOSIS — R112 Nausea with vomiting, unspecified: Secondary | ICD-10-CM | POA: Insufficient documentation

## 2011-01-18 DIAGNOSIS — R42 Dizziness and giddiness: Secondary | ICD-10-CM | POA: Insufficient documentation

## 2011-01-18 DIAGNOSIS — R748 Abnormal levels of other serum enzymes: Secondary | ICD-10-CM | POA: Insufficient documentation

## 2011-01-18 LAB — POCT I-STAT TROPONIN I: Troponin i, poc: 0 ng/mL (ref 0.00–0.08)

## 2011-01-18 LAB — COMPREHENSIVE METABOLIC PANEL
ALT: 124 U/L — ABNORMAL HIGH (ref 0–53)
AST: 128 U/L — ABNORMAL HIGH (ref 0–37)
Calcium: 9.6 mg/dL (ref 8.4–10.5)
Potassium: 3.1 mEq/L — ABNORMAL LOW (ref 3.5–5.1)
Sodium: 136 mEq/L (ref 135–145)
Total Protein: 8 g/dL (ref 6.0–8.3)

## 2011-01-18 LAB — DIFFERENTIAL
Basophils Absolute: 0 10*3/uL (ref 0.0–0.1)
Basophils Relative: 1 % (ref 0–1)
Eosinophils Absolute: 0 10*3/uL (ref 0.0–0.7)
Monocytes Absolute: 0.3 10*3/uL (ref 0.1–1.0)
Monocytes Relative: 10 % (ref 3–12)
Neutrophils Relative %: 53 % (ref 43–77)

## 2011-01-18 LAB — URINALYSIS, ROUTINE W REFLEX MICROSCOPIC
Glucose, UA: NEGATIVE mg/dL
Hgb urine dipstick: NEGATIVE
Specific Gravity, Urine: 1.014 (ref 1.005–1.030)
pH: 6 (ref 5.0–8.0)

## 2011-01-18 LAB — CBC
MCH: 33.1 pg (ref 26.0–34.0)
MCHC: 36.2 g/dL — ABNORMAL HIGH (ref 30.0–36.0)
Platelets: 159 10*3/uL (ref 150–400)
RBC: 4.5 MIL/uL (ref 4.22–5.81)

## 2011-01-18 LAB — URINE MICROSCOPIC-ADD ON

## 2011-02-03 ENCOUNTER — Emergency Department (HOSPITAL_COMMUNITY)
Admission: EM | Admit: 2011-02-03 | Discharge: 2011-02-03 | Disposition: A | Discharge status: Home / Self Care | Payer: Self-pay | Attending: Emergency Medicine | Admitting: Emergency Medicine

## 2011-02-03 ENCOUNTER — Other Ambulatory Visit: Payer: Self-pay

## 2011-02-03 DIAGNOSIS — I10 Essential (primary) hypertension: Secondary | ICD-10-CM

## 2011-02-03 DIAGNOSIS — F1010 Alcohol abuse, uncomplicated: Secondary | ICD-10-CM

## 2011-02-03 DIAGNOSIS — R42 Dizziness and giddiness: Secondary | ICD-10-CM | POA: Insufficient documentation

## 2011-02-03 DIAGNOSIS — Z79899 Other long term (current) drug therapy: Secondary | ICD-10-CM | POA: Insufficient documentation

## 2011-02-03 LAB — URINALYSIS, ROUTINE W REFLEX MICROSCOPIC
Ketones, ur: NEGATIVE mg/dL
Leukocytes, UA: NEGATIVE
Nitrite: NEGATIVE
Specific Gravity, Urine: 1.006 (ref 1.005–1.030)
pH: 5 (ref 5.0–8.0)

## 2011-02-03 LAB — RAPID URINE DRUG SCREEN, HOSP PERFORMED
Benzodiazepines: NOT DETECTED
Cocaine: NOT DETECTED

## 2011-02-03 LAB — POCT I-STAT, CHEM 8
BUN: 4 mg/dL — ABNORMAL LOW (ref 6–23)
Calcium, Ion: 1.11 mmol/L — ABNORMAL LOW (ref 1.12–1.32)
Chloride: 105 mEq/L (ref 96–112)

## 2011-02-03 LAB — ETHANOL: Alcohol, Ethyl (B): 198 mg/dL — ABNORMAL HIGH (ref 0–11)

## 2011-02-03 MED ORDER — LISINOPRIL 40 MG PO TABS: 40.0000 mg | ORAL_TABLET | Freq: Once | ORAL | Status: DC

## 2011-02-03 MED ORDER — LISINOPRIL 40 MG PO TABS
40.0000 mg | ORAL_TABLET | ORAL | Status: DC
  Filled 2011-02-03: qty 1

## 2011-02-03 NOTE — ED Notes (Signed)
Patient presents with high blood pressure and states "i feel funny" ETOH on board, patient states he's had only one beer but appears intoxicated. Poor historian. Patient also reporting chest pain.

## 2011-02-03 NOTE — ED Provider Notes (Signed)
History     CSN: [id number] Arrival date & time: 02/03/2011  3:29 PM   None     Chief Complaint  Patient presents with  . Hypertension  . Alcohol Intoxication    (Consider location/radiation/quality/duration/timing/severity/associated sxs/prior treatment) Patient is a 50 y.o. male presenting with neurologic complaint. The history is provided by the patient.  Neurologic Problem The primary symptoms include dizziness. Primary symptoms do not include headaches, fever, nausea or vomiting. The symptoms began 2 to 6 hours ago. The symptoms are resolved. Context: pt states he has been unable to take his BP med for the last three days.  He describes the dizziness as lightheadedness. The dizziness began today. The dizziness has been resolved since its onset. The dizziness is associated with alcohol use. Dizziness does not occur with blurred vision, nausea, vomiting, weakness or diaphoresis.  Additional symptoms do not include weakness.    History reviewed. No pertinent past medical history.  History reviewed. No pertinent past surgical history.  History reviewed. No pertinent family history.  History  Substance Use Topics  . Smoking status: Current Everyday Smoker  . Smokeless tobacco: Not on file  . Alcohol Use: Yes     Smells of ETOH      Review of Systems  Constitutional: Negative for fever and diaphoresis.  Eyes: Negative for blurred vision.  Respiratory: Negative for cough and shortness of breath.   Cardiovascular: Negative for chest pain.  Gastrointestinal: Negative for nausea, vomiting, abdominal pain and diarrhea.  Neurological: Positive for dizziness. Negative for weakness and headaches.  All other systems reviewed and are negative.    Allergies  Review of patient's allergies indicates no known allergies.  Home Medications   Current Outpatient Rx  Name Route Sig Dispense Refill  . LISINOPRIL 40 MG PO TABS Oral Take 40 mg by mouth daily.        BP 196/118   Pulse 88  Temp(Src) 97.4 F (36.3 C) (Oral)  Resp 16  SpO2 98%  Physical Exam  Nursing note and vitals reviewed. Constitutional: He is oriented to person, place, and time. He appears well-developed and well-nourished. No distress.  HENT:  Head: Normocephalic and atraumatic.  Eyes: EOM are normal. Pupils are equal, round, and reactive to light.  Cardiovascular: Normal rate, regular rhythm and normal heart sounds.   Pulmonary/Chest: Effort normal and breath sounds normal. No respiratory distress.  Abdominal: Soft. He exhibits no distension. There is no tenderness.  Musculoskeletal: Normal range of motion.  Neurological: He is alert and oriented to person, place, and time. No cranial nerve deficit. He exhibits normal muscle tone. Coordination normal.  Skin: Skin is warm and dry.  Psychiatric: He has a normal mood and affect.    ED Course  Procedures (including critical care time)  Labs Reviewed  ETHANOL - Abnormal; Notable for the following:    Alcohol, Ethyl (B) 198 (*)    All other components within normal limits  POCT I-STAT, CHEM 8 - Abnormal; Notable for the following:    BUN 4 (*)    Calcium, Ion 1.11 (*)    All other components within normal limits  URINALYSIS, ROUTINE W REFLEX MICROSCOPIC  URINE RAPID DRUG SCREEN (HOSP PERFORMED)    Date: 02/03/2011  Rate: 85  Rhythm: normal sinus rhythm  QRS Axis: normal  Intervals: normal  ST/T Wave abnormalities: early repolarization  Conduction Disutrbances:none  Narrative Interpretation:   Old EKG Reviewed: unchanged    1. ALCOHOL ABUSE   2. Hypertension  MDM  7:36 PM Pt asx at this time, but with elevated BP. Pt shows no signs of end-organ damage. Will get EKG to ensure no changes and will check Cr to ensure no acute renal damage from HTN. If all normal, will give RX and dose of home BP med (lisinopril).   Pt noted by staff to appear intoxicated. Will check EtOH level as this could explain the patient's earlier  symptoms and why they have resolved.   EtOH elevated despite not being drawn until more than 5 hours into patient being in ED. Rest of labs and EKG unremarkable. Will give lisinopril.        Daleen Bo 02/04/11 [id number]

## 2011-02-03 NOTE — ED Notes (Signed)
Pt given discharge instructions.  Verbalized understanding.  NAD noted.  Pt ambulatory to lobby.  VSS.

## 2011-02-04 NOTE — ED Provider Notes (Signed)
I saw and evaluated the patient, reviewed the resident's note and I agree with the findings and plan. I saw and evaluated the patient, reviewed the resident's note and I agree with the findings and plan.  The patient is a 50 year old alcoholic with a history of hypertension, who presents to the emergency department complaining of hypertension.  He states that he has not had his lisinopril and a few days because she did not by his medicines.  She is his wife.  He denies nausea, vomiting vision changes.  He had had the headache, but that is resolved now.  He is asymptomatic at this time.  His blood pressure is decreased.  He had a normal neurological examination.  There is no evidence of endorgan damage.  He has been advised that he should quit drinking excessive amounts of alcohol and smoking.  Cigarettes.  We'll give him a prescription for lisinopril, and he can follow up with his Dr. for reevaluation.   Nicholes Stairs, MD 02/04/11 [phone]

## 2011-03-26 ENCOUNTER — Encounter (HOSPITAL_COMMUNITY): Payer: Self-pay | Admitting: *Deleted

## 2011-03-26 ENCOUNTER — Emergency Department (HOSPITAL_COMMUNITY)
Admission: EM | Admit: 2011-03-26 | Discharge: 2011-03-26 | Disposition: A | Discharge status: Home / Self Care | Payer: Self-pay | Attending: Emergency Medicine | Admitting: Emergency Medicine

## 2011-03-26 DIAGNOSIS — R509 Fever, unspecified: Secondary | ICD-10-CM | POA: Insufficient documentation

## 2011-03-26 DIAGNOSIS — R110 Nausea: Secondary | ICD-10-CM | POA: Insufficient documentation

## 2011-03-26 DIAGNOSIS — Z79899 Other long term (current) drug therapy: Secondary | ICD-10-CM | POA: Insufficient documentation

## 2011-03-26 DIAGNOSIS — R599 Enlarged lymph nodes, unspecified: Secondary | ICD-10-CM | POA: Insufficient documentation

## 2011-03-26 DIAGNOSIS — R42 Dizziness and giddiness: Secondary | ICD-10-CM | POA: Insufficient documentation

## 2011-03-26 DIAGNOSIS — F17200 Nicotine dependence, unspecified, uncomplicated: Secondary | ICD-10-CM | POA: Insufficient documentation

## 2011-03-26 DIAGNOSIS — I10 Essential (primary) hypertension: Secondary | ICD-10-CM | POA: Insufficient documentation

## 2011-03-26 HISTORY — DX: Essential (primary) hypertension: I10

## 2011-03-26 MED ORDER — ONDANSETRON 4 MG PO TBDP
8.0000 mg | ORAL_TABLET | Freq: Once | ORAL | Status: AC
  Administered 2011-03-26: 8 mg via ORAL
  Filled 2011-03-26: qty 2

## 2011-03-26 MED ORDER — LISINOPRIL 40 MG PO TABS
40.0000 mg | ORAL_TABLET | Freq: Once | ORAL | Status: AC
  Administered 2011-03-26: 40 mg via ORAL
  Filled 2011-03-26: qty 1

## 2011-03-26 NOTE — ED Notes (Signed)
To ed for eval of HTN. States he took his last b/p pill this am. Pt states he had one episode of vomiting this am, but none since. Pt appears in nad. States he is without funds to see his pmd.

## 2011-03-26 NOTE — ED Provider Notes (Signed)
History     CSN: [id number]  Arrival date & time 03/26/11  1133   First MD Initiated Contact with Patient 03/26/11 1300      Chief Complaint  Patient presents with  . Hypertension    (Consider location/radiation/quality/duration/timing/severity/associated sxs/prior treatment) Patient is a 51 y.o. male presenting with hypertension. The history is provided by the patient. No language interpreter was used.  Hypertension This is a new problem. The current episode started in the past 7 days. The problem occurs intermittently. The problem has been unchanged. Associated symptoms include a fever, nausea and swollen glands. Pertinent negatives include no abdominal pain, chest pain, coughing, headaches, neck pain, numbness, vertigo, visual change, vomiting or weakness. The symptoms are aggravated by nothing. He has tried nothing for the symptoms.    Past Medical History  Diagnosis Date  . Hypertension     History reviewed. No pertinent past surgical history.  History reviewed. No pertinent family history.  History  Substance Use Topics  . Smoking status: Current Everyday Smoker  . Smokeless tobacco: Not on file  . Alcohol Use: Yes     Smells of ETOH      Review of Systems  Constitutional: Positive for fever.  HENT: Negative for neck pain.   Eyes: Negative for photophobia, pain and visual disturbance.  Respiratory: Negative for cough, choking, chest tightness, shortness of breath and wheezing.   Cardiovascular: Negative for chest pain, palpitations and leg swelling.  Gastrointestinal: Positive for nausea. Negative for vomiting and abdominal pain.  Neurological: Positive for light-headedness. Negative for dizziness, vertigo, facial asymmetry, weakness, numbness and headaches.  All other systems reviewed and are negative.    Allergies  Review of patient's allergies indicates no known allergies.  Home Medications   Current Outpatient Rx  Name Route Sig Dispense Refill  .  LISINOPRIL 40 MG PO TABS Oral Take 40 mg by mouth daily.       BP 187/109  Pulse 64  Temp(Src) 97.4 F (36.3 C) (Oral)  SpO2 95%  Physical Exam  Nursing note and vitals reviewed. Constitutional: He appears well-developed and well-nourished. No distress.  HENT:  Head: Normocephalic.  Eyes: Pupils are equal, round, and reactive to light.  Neck: Normal range of motion.  Cardiovascular: Normal rate.   Pulmonary/Chest: Effort normal and breath sounds normal. No respiratory distress. He has no wheezes. He has no rales.  Abdominal: Soft.  Musculoskeletal: Normal range of motion.  Skin: He is not diaphoretic.    ED Course  Procedures (including critical care time)  Labs Reviewed - No data to display No results found.   No diagnosis found.    MDM  Out of lisinipril x ? Doses.  States that he does not remember how many doses he missed in the past week.  Has a rx for refill but no money.  Case management notified and he has used all of his funds.  Light headed at times but not today.  A little nauseated despite asking for food to eat. Will give 1 dose of his lisinopril 40mg  per patient and he will get money from boss today hopefully and get his rx refilled so he can take it daily.  Multiple ER visits for the same .  No h/a weakness chest pain nausea or dizziness.  Will follow up with pcp or return if worse.      Jethro Bastos, NP 03/27/11 1429

## 2011-03-27 ENCOUNTER — Other Ambulatory Visit: Payer: Self-pay

## 2011-03-27 ENCOUNTER — Emergency Department (HOSPITAL_COMMUNITY): Payer: Self-pay

## 2011-03-27 ENCOUNTER — Emergency Department (HOSPITAL_COMMUNITY)
Admission: EM | Admit: 2011-03-27 | Discharge: 2011-03-27 | Disposition: A | Discharge status: Home / Self Care | Payer: Self-pay | Attending: Emergency Medicine | Admitting: Emergency Medicine

## 2011-03-27 ENCOUNTER — Encounter (HOSPITAL_COMMUNITY): Payer: Self-pay | Admitting: Emergency Medicine

## 2011-03-27 DIAGNOSIS — F1010 Alcohol abuse, uncomplicated: Secondary | ICD-10-CM | POA: Insufficient documentation

## 2011-03-27 DIAGNOSIS — I10 Essential (primary) hypertension: Secondary | ICD-10-CM | POA: Insufficient documentation

## 2011-03-27 DIAGNOSIS — R110 Nausea: Secondary | ICD-10-CM | POA: Insufficient documentation

## 2011-03-27 DIAGNOSIS — J329 Chronic sinusitis, unspecified: Secondary | ICD-10-CM | POA: Insufficient documentation

## 2011-03-27 DIAGNOSIS — R51 Headache: Secondary | ICD-10-CM | POA: Insufficient documentation

## 2011-03-27 DIAGNOSIS — F17200 Nicotine dependence, unspecified, uncomplicated: Secondary | ICD-10-CM | POA: Insufficient documentation

## 2011-03-27 DIAGNOSIS — R945 Abnormal results of liver function studies: Secondary | ICD-10-CM | POA: Insufficient documentation

## 2011-03-27 DIAGNOSIS — R42 Dizziness and giddiness: Secondary | ICD-10-CM | POA: Insufficient documentation

## 2011-03-27 DIAGNOSIS — R748 Abnormal levels of other serum enzymes: Secondary | ICD-10-CM

## 2011-03-27 DIAGNOSIS — Z79899 Other long term (current) drug therapy: Secondary | ICD-10-CM | POA: Insufficient documentation

## 2011-03-27 LAB — HEPATIC FUNCTION PANEL
ALT: 99 U/L — ABNORMAL HIGH (ref 0–53)
Alkaline Phosphatase: 65 U/L (ref 39–117)
Indirect Bilirubin: 0.2 mg/dL — ABNORMAL LOW (ref 0.3–0.9)
Total Protein: 7.9 g/dL (ref 6.0–8.3)

## 2011-03-27 LAB — BASIC METABOLIC PANEL
CO2: 28 mEq/L (ref 19–32)
Calcium: 9.6 mg/dL (ref 8.4–10.5)
Glucose, Bld: 98 mg/dL (ref 70–99)
Potassium: 3.7 mEq/L (ref 3.5–5.1)
Sodium: 136 mEq/L (ref 135–145)

## 2011-03-27 LAB — CBC
Hemoglobin: 14.4 g/dL (ref 13.0–17.0)
MCH: 32.2 pg (ref 26.0–34.0)
MCV: 93.3 fL (ref 78.0–100.0)
RBC: 4.47 MIL/uL (ref 4.22–5.81)

## 2011-03-27 LAB — RAPID URINE DRUG SCREEN, HOSP PERFORMED
Amphetamines: NOT DETECTED
Barbiturates: NOT DETECTED
Benzodiazepines: NOT DETECTED
Tetrahydrocannabinol: NOT DETECTED

## 2011-03-27 MED ORDER — ACETAMINOPHEN 500 MG PO TABS
1000.0000 mg | ORAL_TABLET | Freq: Once | ORAL | Status: AC
  Administered 2011-03-27: 1000 mg via ORAL
  Filled 2011-03-27: qty 2

## 2011-03-27 MED ORDER — MORPHINE SULFATE 4 MG/ML IJ SOLN
4.0000 mg | Freq: Once | INTRAMUSCULAR | Status: AC
  Administered 2011-03-27: 4 mg via INTRAVENOUS
  Filled 2011-03-27: qty 1

## 2011-03-27 MED ORDER — AZITHROMYCIN 250 MG PO TABS: ORAL_TABLET | ORAL | Status: DC

## 2011-03-27 MED ORDER — LISINOPRIL 40 MG PO TABS
40.0000 mg | ORAL_TABLET | Freq: Once | ORAL | Status: AC
  Administered 2011-03-27: 40 mg via ORAL
  Filled 2011-03-27: qty 1

## 2011-03-27 MED ORDER — VITAMIN B-1 100 MG PO TABS: 100.0000 mg | ORAL_TABLET | Freq: Once | ORAL | Status: DC

## 2011-03-27 MED ORDER — METOCLOPRAMIDE HCL 5 MG/ML IJ SOLN
10.0000 mg | Freq: Once | INTRAMUSCULAR | Status: AC
  Administered 2011-03-27: 10 mg via INTRAVENOUS
  Filled 2011-03-27: qty 2

## 2011-03-27 MED ORDER — LORAZEPAM 2 MG/ML IJ SOLN
1.0000 mg | Freq: Once | INTRAMUSCULAR | Status: AC
  Administered 2011-03-27: 11:00:00 via INTRAVENOUS
  Filled 2011-03-27: qty 1

## 2011-03-27 NOTE — ED Notes (Signed)
PT. REPORTS ELEVATED BLOOD PRESSURE YESTERDAY " 209 "- SYSTOLIC , PRESENT WITH HEADACHE AND DIZZINESS.

## 2011-03-27 NOTE — ED Notes (Signed)
Pt moved to room 21. A&Ox4. Skin w/p/d. Resp even and unlabored. Denies pain. NAD noted at this time.

## 2011-03-27 NOTE — ED Notes (Signed)
Pt states headache has gone away.  He is hungry and requesting lunch. Pt back from CT

## 2011-03-27 NOTE — ED Notes (Signed)
Pt given turkey sandwich

## 2011-03-27 NOTE — ED Provider Notes (Signed)
History     CSN: [id number]  Arrival date & time 03/27/11  0702   First MD Initiated Contact with Patient 03/27/11 [phone]      Chief Complaint  Patient presents with  . Hypertension    (Consider location/radiation/quality/duration/timing/severity/associated sxs/prior treatment) HPI  Patient has been seen numerous times in the past in the emergency department complaining of high blood pressure with associated headache, lightheadedness, and nausea who is most recently seen in emergency department yesterday complaining of being out of his lisinopril returns to the emergency department today stating that he was given a dose of lisinopril yesterday but was unable to fill his prescription yesterday and therefore has not taken his lisinopril today and woke this morning once again with a headache and feeling dizzy stating "this is how is how I feel when my blood pressure is high and it was as high as 209 systolic yesterday" however patient did not take his blood pressure this morning before coming to the ER. Patient has history of alcohol and tobacco abuse stating that he drinks one to two 40 ounce beers a day and smokes close to a pack of cigarettes. Patient has been non compliant to lisinopril for many ER visits per ED note review and states he is not following up with PCP, Dr. Ronne Binning, because "You can only get in to see him after 5 oclock." Patient states the HA and dizziness have decreased since onset however still feeling mildly nauseated. Denies associated fevers, chills, visual changes, extremity numbness/tingling/weakness, abdominal pain, CP, SOB.   Past Medical History  Diagnosis Date  . Hypertension     History reviewed. No pertinent past surgical history.  No family history on file.  History  Substance Use Topics  . Smoking status: Current Everyday Smoker  . Smokeless tobacco: Not on file  . Alcohol Use: Yes     Smells of ETOH      Review of Systems  All other systems  reviewed and are negative.    Allergies  Review of patient's allergies indicates no known allergies.  Home Medications   Current Outpatient Rx  Name Route Sig Dispense Refill  . LISINOPRIL 40 MG PO TABS Oral Take 40 mg by mouth daily.       BP 176/109  Pulse 62  Temp(Src) 97.9 F (36.6 C) (Oral)  Resp 12  SpO2 100%  Physical Exam  Nursing note and vitals reviewed. Constitutional: He is oriented to person, place, and time. He appears well-developed and well-nourished. No distress.  HENT:  Head: Normocephalic and atraumatic.  Eyes: Conjunctivae and EOM are normal. Pupils are equal, round, and reactive to light.  Neck: Normal range of motion. Neck supple.  Cardiovascular: Normal rate, regular rhythm, normal heart sounds and intact distal pulses.  Exam reveals no gallop and no friction rub.   No murmur heard. Pulmonary/Chest: Effort normal and breath sounds normal. No respiratory distress. He has no wheezes. He has no rales. He exhibits no tenderness.  Abdominal: Bowel sounds are normal. He exhibits no distension and no mass. There is no tenderness. There is no rebound and no guarding.  Musculoskeletal: Normal range of motion. He exhibits no edema and no tenderness.  Neurological: He is alert and oriented to person, place, and time.  Skin: Skin is warm and dry. No rash noted. He is not diaphoretic. No erythema.  Psychiatric: He has a normal mood and affect.    ED Course  Procedures (including critical care time)  PO lisinopril and tylenol  9:48 AM Patient is complaining of recurrence of left sided HA, dizziness and nausea but now with diffuse abdominal pain but still no neurofocal findings on exam. Patient declines ambulation stating, "I feet too dizzy."    Date: 03/27/2011  Rate: 58  Rhythm: normal sinus rhythm  QRS Axis: normal  Intervals: normal  ST/T Wave abnormalities: nonspecific ST changes  Conduction Disutrbances:none  Narrative Interpretation:   Old EKG  Reviewed: non specific ST changes but unchanged from prior Feb 03, 2011    Labs Reviewed  CBC - Abnormal; Notable for the following:    WBC 3.4 (*)    All other components within normal limits  BASIC METABOLIC PANEL  ETHANOL  URINE RAPID DRUG SCREEN (HOSP PERFORMED)   Mr Brain Wo Contrast  03/27/2011  *RADIOLOGY REPORT*  Clinical Data: High blood pressure.  Headaches.  Dizziness.  Rule out posterior fossa stroke.  MRI HEAD WITHOUT CONTRAST  Technique:  Multiplanar, multiecho pulse sequences of the brain and surrounding structures were obtained according to standard protocol without intravenous contrast.  Comparison: 01/18/2011 head CT.  No comparison brain MR.  Findings: Motion degraded exam.  No acute infarct.  No intracranial hemorrhage.  No intracranial mass lesion detected on this unenhanced exam.  Moderate white matter type changes most notable periventricular region probably related to result small vessel disease in this hypertensive patient.  Global atrophy without hydrocephalus.  Major intracranial vascular structures are patent.  Paranasal sinus mucosal thickening most notable left maxillary sinus.  Cervical spondylotic changes with degenerative changes C1-2 articulation.  Mild spinal stenosis.  Partially empty sella.  This may be an incidental finding although also described in pseudotumor cerebri.  IMPRESSION: Motion degraded exam.  No acute infarct.  Moderate white matter type changes most notable periventricular region probably related to result small vessel disease in this hypertensive patient.  Global atrophy without hydrocephalus.  Paranasal sinus mucosal thickening most notable left maxillary sinus.  Partially empty sella.  This may be an incidental finding although also described in pseudotumor cerebri.  Original Report Authenticated By: Fuller Canada, M.D.     1. ALCOHOL ABUSE   2. TOBACCO ABUSE   3. HYPERTENSION   4. Elevated liver enzymes   5. Sinusitis       MDM    Poorly controlled hypertension with multiple episodes presenting to emergency department complaining of hypertension with associated headache abdominal pain and nausea however his MRI today did not show any acute findings of stroke or posterior sure. MRI however did show a left opacified sinus and therefore will treat him for sinusitis and again stressed the importance of taking his daily blood pressure medicine and following up with his primary care physician. Also stressed once again the importance of decreasing his alcohol use and tobacco cessation. Patient has no other neurofocal findings is in no acute distress.        Jenness Corner, [place] 03/27/11 1421

## 2011-03-27 NOTE — ED Provider Notes (Signed)
Medical screening examination/treatment/procedure(s) were conducted as a shared visit with non-physician practitioner(s) and myself.  I personally evaluated the patient during the encounter  Pt without signs of stroke on MRI.  History of alcoholism.  Not currently intoxicated.  Remains stable.  Will dc home.  Celene Kras, MD 03/27/11 [phone]

## 2011-03-28 NOTE — ED Provider Notes (Signed)
Medical screening examination/treatment/procedure(s) were performed by non-physician practitioner and as supervising physician I was immediately available for consultation/collaboration.  Donnetta Hutching, MD 03/28/11 [phone]

## 2011-04-02 ENCOUNTER — Emergency Department (HOSPITAL_COMMUNITY)
Admission: EM | Admit: 2011-04-02 | Discharge: 2011-04-02 | Discharge status: Against Medical Advice | Payer: Self-pay | Attending: Emergency Medicine | Admitting: Emergency Medicine

## 2011-04-02 ENCOUNTER — Encounter (HOSPITAL_COMMUNITY): Payer: Self-pay | Admitting: Emergency Medicine

## 2011-04-02 DIAGNOSIS — R109 Unspecified abdominal pain: Secondary | ICD-10-CM | POA: Insufficient documentation

## 2011-04-02 NOTE — ED Notes (Signed)
Name called x 2. No answer.

## 2011-04-02 NOTE — ED Notes (Signed)
Pt c/o mid abd pain with nausea and vomiting.  Unable to keep anything down.  St's was seen here for same on Mon. But not any better.  St's feels worse

## 2012-08-02 ENCOUNTER — Inpatient Hospital Stay (HOSPITAL_COMMUNITY): Payer: Medicaid Other

## 2012-08-02 ENCOUNTER — Emergency Department (HOSPITAL_COMMUNITY): Payer: Medicaid Other

## 2012-08-02 ENCOUNTER — Encounter (HOSPITAL_COMMUNITY): Payer: Self-pay | Admitting: Emergency Medicine

## 2012-08-02 ENCOUNTER — Inpatient Hospital Stay (HOSPITAL_COMMUNITY)
Admission: EM | Admit: 2012-08-02 | Discharge: 2012-08-05 | DRG: 312 | Disposition: A | Discharge status: Home / Self Care | Payer: Medicaid Other | Attending: Internal Medicine | Admitting: Internal Medicine

## 2012-08-02 DIAGNOSIS — S02401A Maxillary fracture, unspecified, initial encounter for closed fracture: Secondary | ICD-10-CM | POA: Diagnosis present

## 2012-08-02 DIAGNOSIS — I10 Essential (primary) hypertension: Secondary | ICD-10-CM | POA: Diagnosis present

## 2012-08-02 DIAGNOSIS — E876 Hypokalemia: Secondary | ICD-10-CM

## 2012-08-02 DIAGNOSIS — W19XXXA Unspecified fall, initial encounter: Secondary | ICD-10-CM

## 2012-08-02 DIAGNOSIS — S02400A Malar fracture unspecified, initial encounter for closed fracture: Secondary | ICD-10-CM | POA: Diagnosis present

## 2012-08-02 DIAGNOSIS — S0292XA Unspecified fracture of facial bones, initial encounter for closed fracture: Secondary | ICD-10-CM

## 2012-08-02 DIAGNOSIS — R55 Syncope and collapse: Principal | ICD-10-CM | POA: Diagnosis present

## 2012-08-02 DIAGNOSIS — S060XAA Concussion with loss of consciousness status unknown, initial encounter: Secondary | ICD-10-CM | POA: Diagnosis present

## 2012-08-02 DIAGNOSIS — F1010 Alcohol abuse, uncomplicated: Secondary | ICD-10-CM | POA: Diagnosis present

## 2012-08-02 DIAGNOSIS — S060X9A Concussion with loss of consciousness of unspecified duration, initial encounter: Secondary | ICD-10-CM | POA: Diagnosis present

## 2012-08-02 DIAGNOSIS — S0280XA Fracture of other specified skull and facial bones, unspecified side, initial encounter for closed fracture: Secondary | ICD-10-CM

## 2012-08-02 DIAGNOSIS — I517 Cardiomegaly: Secondary | ICD-10-CM | POA: Diagnosis present

## 2012-08-02 DIAGNOSIS — Y990 Civilian activity done for income or pay: Secondary | ICD-10-CM

## 2012-08-02 DIAGNOSIS — F17200 Nicotine dependence, unspecified, uncomplicated: Secondary | ICD-10-CM | POA: Diagnosis present

## 2012-08-02 DIAGNOSIS — N179 Acute kidney failure, unspecified: Secondary | ICD-10-CM | POA: Diagnosis present

## 2012-08-02 LAB — CBC WITH DIFFERENTIAL/PLATELET
Basophils Relative: 0 % (ref 0–1)
Eosinophils Absolute: 0 10*3/uL (ref 0.0–0.7)
Eosinophils Relative: 0 % (ref 0–5)
Hemoglobin: 16.8 g/dL (ref 13.0–17.0)
MCH: 33.7 pg (ref 26.0–34.0)
MCHC: 37 g/dL — ABNORMAL HIGH (ref 30.0–36.0)
Monocytes Absolute: 0.5 10*3/uL (ref 0.1–1.0)
Monocytes Relative: 8 % (ref 3–12)
Neutrophils Relative %: 75 % (ref 43–77)

## 2012-08-02 LAB — POCT I-STAT TROPONIN I

## 2012-08-02 LAB — RAPID URINE DRUG SCREEN, HOSP PERFORMED
Amphetamines: NOT DETECTED
Opiates: POSITIVE — AB
Tetrahydrocannabinol: NOT DETECTED

## 2012-08-02 MED ORDER — ACETAMINOPHEN 325 MG PO TABS: 650.0000 mg | ORAL_TABLET | Freq: Four times a day (QID) | ORAL | Status: DC | PRN

## 2012-08-02 MED ORDER — SODIUM CHLORIDE 0.9 % IV SOLN
INTRAVENOUS | Status: DC
  Administered 2012-08-02: 100 mL/h via INTRAVENOUS
  Administered 2012-08-03: 03:00:00 via INTRAVENOUS

## 2012-08-02 MED ORDER — SODIUM CHLORIDE 0.9 % IV BOLUS (SEPSIS)
1000.0000 mL | Freq: Once | INTRAVENOUS | Status: AC
  Administered 2012-08-02: 1000 mL via INTRAVENOUS

## 2012-08-02 MED ORDER — THIAMINE HCL 100 MG/ML IJ SOLN
100.0000 mg | Freq: Every day | INTRAMUSCULAR | Status: DC
  Filled 2012-08-02 (×4): qty 1

## 2012-08-02 MED ORDER — VITAMIN B-1 100 MG PO TABS
100.0000 mg | ORAL_TABLET | Freq: Every day | ORAL | Status: DC
  Administered 2012-08-02 – 2012-08-05 (×4): 100 mg via ORAL
  Filled 2012-08-02 (×4): qty 1

## 2012-08-02 MED ORDER — ONDANSETRON HCL 4 MG/2ML IJ SOLN: 4.0000 mg | Freq: Four times a day (QID) | INTRAMUSCULAR | Status: DC | PRN

## 2012-08-02 MED ORDER — CEFAZOLIN SODIUM 1-5 GM-% IV SOLN: 1.0000 g | Freq: Once | INTRAVENOUS | Status: DC

## 2012-08-02 MED ORDER — HYDRALAZINE HCL 25 MG PO TABS
25.0000 mg | ORAL_TABLET | Freq: Three times a day (TID) | ORAL | Status: DC
  Administered 2012-08-02 – 2012-08-05 (×9): 25 mg via ORAL
  Filled 2012-08-02 (×11): qty 1

## 2012-08-02 MED ORDER — ENOXAPARIN SODIUM 40 MG/0.4ML ~~LOC~~ SOLN
40.0000 mg | SUBCUTANEOUS | Status: DC
  Administered 2012-08-02 – 2012-08-03 (×2): 40 mg via SUBCUTANEOUS
  Filled 2012-08-02 (×3): qty 0.4

## 2012-08-02 MED ORDER — AMOXICILLIN-POT CLAVULANATE 875-125 MG PO TABS
1.0000 | ORAL_TABLET | Freq: Two times a day (BID) | ORAL | Status: DC
  Administered 2012-08-02 – 2012-08-05 (×6): 1 via ORAL
  Filled 2012-08-02 (×7): qty 1

## 2012-08-02 MED ORDER — LORAZEPAM 1 MG PO TABS: 1.0000 mg | ORAL_TABLET | Freq: Four times a day (QID) | ORAL | Status: DC | PRN

## 2012-08-02 MED ORDER — ASPIRIN EC 81 MG PO TBEC
81.0000 mg | DELAYED_RELEASE_TABLET | Freq: Every day | ORAL | Status: DC
  Administered 2012-08-02 – 2012-08-04 (×2): 81 mg via ORAL
  Filled 2012-08-02 (×3): qty 1

## 2012-08-02 MED ORDER — LORAZEPAM 2 MG/ML IJ SOLN
1.0000 mg | Freq: Four times a day (QID) | INTRAMUSCULAR | Status: DC | PRN
  Administered 2012-08-03: 1 mg via INTRAVENOUS
  Filled 2012-08-02: qty 1

## 2012-08-02 MED ORDER — ADULT MULTIVITAMIN W/MINERALS CH
1.0000 | ORAL_TABLET | Freq: Every day | ORAL | Status: DC
  Administered 2012-08-02 – 2012-08-05 (×3): 1 via ORAL
  Filled 2012-08-02 (×4): qty 1

## 2012-08-02 MED ORDER — POTASSIUM CHLORIDE CRYS ER 20 MEQ PO TBCR
40.0000 meq | EXTENDED_RELEASE_TABLET | Freq: Once | ORAL | Status: AC
  Administered 2012-08-02: 40 meq via ORAL
  Filled 2012-08-02: qty 2

## 2012-08-02 MED ORDER — ACETAMINOPHEN 650 MG RE SUPP: 650.0000 mg | Freq: Four times a day (QID) | RECTAL | Status: DC | PRN

## 2012-08-02 MED ORDER — SODIUM CHLORIDE 0.9 % IJ SOLN
3.0000 mL | Freq: Two times a day (BID) | INTRAMUSCULAR | Status: DC
  Administered 2012-08-02 – 2012-08-04 (×4): 3 mL via INTRAVENOUS

## 2012-08-02 MED ORDER — HYDRALAZINE HCL 20 MG/ML IJ SOLN
10.0000 mg | INTRAMUSCULAR | Status: DC | PRN
  Administered 2012-08-02 – 2012-08-05 (×5): 10 mg via INTRAVENOUS
  Filled 2012-08-02 (×6): qty 1

## 2012-08-02 MED ORDER — MORPHINE SULFATE 4 MG/ML IJ SOLN
6.0000 mg | Freq: Once | INTRAMUSCULAR | Status: AC
  Administered 2012-08-02: 6 mg via INTRAVENOUS
  Filled 2012-08-02: qty 2

## 2012-08-02 MED ORDER — AMOXICILLIN-POT CLAVULANATE 875-125 MG PO TABS
1.0000 | ORAL_TABLET | Freq: Once | ORAL | Status: AC
  Administered 2012-08-02: 1 via ORAL
  Filled 2012-08-02: qty 1

## 2012-08-02 MED ORDER — ONDANSETRON HCL 4 MG PO TABS: 4.0000 mg | ORAL_TABLET | Freq: Four times a day (QID) | ORAL | Status: DC | PRN

## 2012-08-02 MED ORDER — FOLIC ACID 1 MG PO TABS
1.0000 mg | ORAL_TABLET | Freq: Every day | ORAL | Status: DC
  Administered 2012-08-02 – 2012-08-05 (×3): 1 mg via ORAL
  Filled 2012-08-02 (×4): qty 1

## 2012-08-02 MED ORDER — HYDROCODONE-ACETAMINOPHEN 5-325 MG PO TABS
1.0000 | ORAL_TABLET | ORAL | Status: DC | PRN
  Administered 2012-08-04: 1 via ORAL
  Filled 2012-08-02: qty 1

## 2012-08-02 MED ORDER — TETANUS-DIPHTH-ACELL PERTUSSIS 5-2.5-18.5 LF-MCG/0.5 IM SUSP
0.5000 mL | Freq: Once | INTRAMUSCULAR | Status: AC
  Administered 2012-08-02: 0.5 mL via INTRAMUSCULAR
  Filled 2012-08-02: qty 0.5

## 2012-08-02 MED ORDER — LABETALOL HCL 5 MG/ML IV SOLN
20.0000 mg | Freq: Once | INTRAVENOUS | Status: AC
  Administered 2012-08-02: 20 mg via INTRAVENOUS
  Filled 2012-08-02: qty 4

## 2012-08-02 NOTE — ED Notes (Signed)
MD at bedside. 

## 2012-08-02 NOTE — Consult Note (Signed)
Admit date: 08/02/2012 Referring Physician  Dr. Gonzella Lex Primary Physician  NONE Primary Cardiologist  NONE Reason for Consultation  syncope  HPI: 52 year old male with history of hypertension, alcohol abuse, tobacco abuse who was at work today when he had syncope and landed on the floor on his face. 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 He realized that he had smashed his face on the ground. He had some pain over the face but denied any pain or difficulty getting up and ambulating. He feels slightly dizzy prior to the episode but denies any chest pain, palpitations, shortness of breath, headache, bloody vision, nausea, vomiting, abdominal pain, bowel or urinary symptoms. He feels he may have had a urinary incontinence during the event. He denies similar symptoms in the past. Reports last alcohol use to be 3-4 days back. He informs being on blood pressure medication for his hypertension but  he has not been able to see his PCP or refill his medication for almost 3 months. He denies any weakness of his arms or legs.  Patient was brought to the ED and noted to have uncontrolled hypertension with blood pressure of 192/116. The patient noted to have ecchymosis over right eye and a small laceration over the right upper lip. Blood work was done showing normal CBC, mild hypokalemia, and acute kidney injury . Head CT done showed chronic ischemic vessel changes. CT scan of the face done showing multiple facial bone fractures without extraocular muscle entrapment.  EKG done in the ED showing some ST elevation in V2 and V3 which seems to be similar to his previous EKG from 2013. Dr. [place] was initially consulted for evaluation of EKG and felt EKG was c/w early repolarization and not STEMI.  We now are asked to consult.       PMH:   Past Medical History  Diagnosis Date  . Hypertension      PSH:  History reviewed. No pertinent past surgical history.  Allergies:  Review of patient's allergies indicates no known allergies. Prior to Admit Meds:   Prescriptions prior to admission  Medication Sig Dispense Refill  . lisinopril (PRINIVIL,ZESTRIL) 40 MG tablet Take 40 mg by mouth daily.        Fam HX:   History reviewed. No pertinent family history. Social HX:    History   Social History  . Marital Status: Married    Spouse Name: N/A    Number of Children: N/A  . Years of Education: N/A   Occupational History  . Not on file.   Social History Main Topics  . Smoking status: Former Games developer  . Smokeless tobacco: Not on file  . Alcohol Use: Yes     Comment: Smells of ETOH  St's just stopped drinking  . Drug Use: Yes    Special: Marijuana  . Sexually Active: Not on file     Comment: St's stopped 2 yrs ago   Other Topics Concern  . Not on file   Social History Narrative  . No narrative on file     ROS:  All 11 ROS were addressed and are negative except what is stated in the HPI  Physical Exam: Blood pressure 198/99, pulse 66, temperature 97 F (36.1 C), temperature source Oral, resp. rate 18, height  5\' 6"  (1.676 m), weight 64 kg (141 lb 1.5 oz), SpO2 95.00%.    General: Well developed, well nourished, in no acute distress Head: Marked ecchymosis and swelling of the left eye with laceration  Lungs:   Clear bilaterally to auscultation and percussion. Heart:   HRRR S1 S2 Pulses are 2+ & equal.            No carotid bruit. No JVD.  No abdominal bruits. No femoral bruits. Abdomen: Bowel sounds are positive, abdomen soft and non-tender without masses Extremities:   No clubbing, cyanosis or edema.  DP +1 Neuro: Alert and oriented X 3. Psych:  Good affect, responds appropriately    Labs:   Lab Results  Component Value Date   WBC 6.1 08/02/2012   HGB 21.4* 08/02/2012   HCT 63.0* 08/02/2012   MCV 91.0  08/02/2012   PLT 157 08/02/2012    Recent Labs Lab 08/02/12 1413  NA 134*  K 3.2*  CL 93*  BUN 25*  CREATININE 1.80*  GLUCOSE 92   No results found for this basename: PTT   Lab Results  Component Value Date   INR 1.03 10/18/2009   INR 0.98 10/17/2009   Lab Results  Component Value Date   CKTOTAL 72 10/18/2009   CKMB 1.2 10/18/2009   TROPONINI  Value: 0.01        NO INDICATION OF MYOCARDIAL INJURY. 10/18/2009     Lab Results  Component Value Date   CHOL  Value: 127        ATP III CLASSIFICATION:  <200     mg/dL   Desirable  409-811  mg/dL   Borderline High  >=914    mg/dL   High        7/82/9562   CHOL  Value: 140        ATP III CLASSIFICATION:  <200     mg/dL   Desirable  130-865  mg/dL   Borderline High  >=784    mg/dL   High        69/08/2950   Lab Results  Component Value Date   HDL 39* 10/18/2009   HDL 38* 02/21/2009   Lab Results  Component Value Date   LDLCALC  Value: 63        Total Cholesterol/HDL:CHD Risk Coronary Heart Disease Risk Table                     Men   Women  1/2 Average Risk   3.4   3.3  Average Risk       5.0   4.4  2 X Average Risk   9.6   7.1  3 X Average Risk  23.4   11.0        Use the calculated Patient Ratio above and the CHD Risk Table to determine the patient's CHD Risk.        ATP III CLASSIFICATION (LDL):  <100     mg/dL   Optimal  841-324  mg/dL   Near or Above                    Optimal  130-159  mg/dL   Borderline  401-027  mg/dL   High  >253     mg/dL   Very High 6/64/4034   LDLCALC  Value: 85        Total Cholesterol/HDL:CHD Risk Coronary Heart Disease Risk Table  Men   Women  1/2 Average Risk   3.4   3.3  Average Risk       5.0   4.4  2 X Average Risk   9.6   7.1  3 X Average Risk  23.4   11.0        Use the calculated Patient Ratio above and the CHD Risk Table to determine the patient's CHD Risk.        ATP III CLASSIFICATION (LDL):  <100     mg/dL   Optimal  742-595  mg/dL   Near or Above                    Optimal  130-159   mg/dL   Borderline  638-756  mg/dL   High  >433     mg/dL   Very High 29/07/1882   Lab Results  Component Value Date   TRIG 126 10/18/2009   TRIG 83 02/21/2009   Lab Results  Component Value Date   CHOLHDL 3.3 10/18/2009   CHOLHDL 3.7 02/21/2009   No results found for this basename: LDLDIRECT      Radiology:  Ct Head Wo Contrast  08/02/2012  *RADIOLOGY REPORT*  Clinical Data:  Syncope, fall, right facial swelling, frontal abrasion, facial pain, weakness, nausea and cough for a couple days  CT HEAD WITHOUT CONTRAST CT MAXILLOFACIAL WITHOUT CONTRAST CT CERVICAL SPINE WITHOUT CONTRAST  Technique:  Multidetector CT imaging of the head, cervical spine, and maxillofacial structures were performed using the standard protocol without intravenous contrast. Multiplanar CT image reconstructions of the cervical spine and maxillofacial structures were also generated.  Comparison:  CT head 01/18/2011  CT HEAD  Findings: Minimal atrophy. Normal ventricular morphology. No midline shift or mass effect. Chronic small vessel ischemic changes of deep cerebral white matter. No intracranial hemorrhage, mass lesion, or evidence of acute infarction. Low attenuation at the pons likely related to beam hardening artifacts from skull base. No extra-axial fluid collections. Right maxillary sinus and orbital findings, reported separately below. Calvaria intact.  IMPRESSION: Small vessel chronic ischemic changes of deep cerebral white matter. No acute intracranial abnormalities.  CT MAXILLOFACIAL  Findings: Extensive right orbital pneumatosis with additional soft tissue gas at right infratemporal fossa. No pneumocephalus. Multiple facial bone fractures including: Right zygoma at near right temporal bone confluence. Inferior wall right orbit with displaced fragment into superior right maxillary sinus. Lateral wall right orbit. Anterior and lateral walls right maxillary sinus extending into floor of right maxillary sinus and into the  alveolar ridge. Right nasal bone, displaced  Sub total opacification of right maxillary sinus.  Remaining paranasal sinuses clear. No left facial fractures are definitely visualized. Nasal septal deviation to the left. Right facial soft tissue gas without mandibular fracture.  IMPRESSION: Fractures of the right zygoma, inferior lateral walls right orbit, right nasal bone, anterior and lateral walls right maxillary sinus extending into floor right maxillary sinus and alveolar ridge. No definite extraocular muscle entrapment is seen. Extensive right orbital pneumatosis.  CT CERVICAL SPINE  Findings: Diffuse disc space narrowing endplate spur formation, narrowing of the AP diameter of the spinal canal and encroaching upon multiple bilateral cervical neural foramina. Prevertebral soft tissues normal thickness. Mild scattered facet degenerative changes greatest at right C3-C4. Visualized skull base intact. Scattered carotid arterial calcifications. No definite cervical spine fracture identified.  IMPRESSION: Multilevel degenerative disc disease changes of the cervical spine with scattered AP narrowing of the spinal canal encroachment upon cervical foramina. Minimal  facet degenerative changes greatest at right C3-C4. No acute cervical spine fracture or subluxation.   Original Report Authenticated By: Ulyses Southward, M.D.    Ct Cervical Spine Wo Contrast  08/02/2012  *RADIOLOGY REPORT*  Clinical Data:  Syncope, fall, right facial swelling, frontal abrasion, facial pain, weakness, nausea and cough for a couple days  CT HEAD WITHOUT CONTRAST CT MAXILLOFACIAL WITHOUT CONTRAST CT CERVICAL SPINE WITHOUT CONTRAST  Technique:  Multidetector CT imaging of the head, cervical spine, and maxillofacial structures were performed using the standard protocol without intravenous contrast. Multiplanar CT image reconstructions of the cervical spine and maxillofacial structures were also generated.  Comparison:  CT head 01/18/2011  CT HEAD   Findings: Minimal atrophy. Normal ventricular morphology. No midline shift or mass effect. Chronic small vessel ischemic changes of deep cerebral white matter. No intracranial hemorrhage, mass lesion, or evidence of acute infarction. Low attenuation at the pons likely related to beam hardening artifacts from skull base. No extra-axial fluid collections. Right maxillary sinus and orbital findings, reported separately below. Calvaria intact.  IMPRESSION: Small vessel chronic ischemic changes of deep cerebral white matter. No acute intracranial abnormalities.  CT MAXILLOFACIAL  Findings: Extensive right orbital pneumatosis with additional soft tissue gas at right infratemporal fossa. No pneumocephalus. Multiple facial bone fractures including: Right zygoma at near right temporal bone confluence. Inferior wall right orbit with displaced fragment into superior right maxillary sinus. Lateral wall right orbit. Anterior and lateral walls right maxillary sinus extending into floor of right maxillary sinus and into the alveolar ridge. Right nasal bone, displaced  Sub total opacification of right maxillary sinus.  Remaining paranasal sinuses clear. No left facial fractures are definitely visualized. Nasal septal deviation to the left. Right facial soft tissue gas without mandibular fracture.  IMPRESSION: Fractures of the right zygoma, inferior lateral walls right orbit, right nasal bone, anterior and lateral walls right maxillary sinus extending into floor right maxillary sinus and alveolar ridge. No definite extraocular muscle entrapment is seen. Extensive right orbital pneumatosis.  CT CERVICAL SPINE  Findings: Diffuse disc space narrowing endplate spur formation, narrowing of the AP diameter of the spinal canal and encroaching upon multiple bilateral cervical neural foramina. Prevertebral soft tissues normal thickness. Mild scattered facet degenerative changes greatest at right C3-C4. Visualized skull base intact. Scattered  carotid arterial calcifications. No definite cervical spine fracture identified.  IMPRESSION: Multilevel degenerative disc disease changes of the cervical spine with scattered AP narrowing of the spinal canal encroachment upon cervical foramina. Minimal facet degenerative changes greatest at right C3-C4. No acute cervical spine fracture or subluxation.   Original Report Authenticated By: Ulyses Southward, M.D.    Dg Chest Port 1 View  08/02/2012  *RADIOLOGY REPORT*  Clinical Data: Syncope, hypertension, smoker.  PORTABLE CHEST - 1 VIEW  Comparison: 01/18/2011.  Findings: Trachea is midline.  Heart size within normal limits. Lungs appear hyperinflated but clear.  No pleural fluid.  IMPRESSION: No acute findings.   Original Report Authenticated By: Leanna Battles, M.D.    Ct Maxillofacial Wo Cm  08/02/2012  *RADIOLOGY REPORT*  Clinical Data:  Syncope, fall, right facial swelling, frontal abrasion, facial pain, weakness, nausea and cough for a couple days  CT HEAD WITHOUT CONTRAST CT MAXILLOFACIAL WITHOUT CONTRAST CT CERVICAL SPINE WITHOUT CONTRAST  Technique:  Multidetector CT imaging of the head, cervical spine, and maxillofacial structures were performed using the standard protocol without intravenous contrast. Multiplanar CT image reconstructions of the cervical spine and maxillofacial structures were also generated.  Comparison:  CT  head 01/18/2011  CT HEAD  Findings: Minimal atrophy. Normal ventricular morphology. No midline shift or mass effect. Chronic small vessel ischemic changes of deep cerebral white matter. No intracranial hemorrhage, mass lesion, or evidence of acute infarction. Low attenuation at the pons likely related to beam hardening artifacts from skull base. No extra-axial fluid collections. Right maxillary sinus and orbital findings, reported separately below. Calvaria intact.  IMPRESSION: Small vessel chronic ischemic changes of deep cerebral white matter. No acute intracranial abnormalities.  CT  MAXILLOFACIAL  Findings: Extensive right orbital pneumatosis with additional soft tissue gas at right infratemporal fossa. No pneumocephalus. Multiple facial bone fractures including: Right zygoma at near right temporal bone confluence. Inferior wall right orbit with displaced fragment into superior right maxillary sinus. Lateral wall right orbit. Anterior and lateral walls right maxillary sinus extending into floor of right maxillary sinus and into the alveolar ridge. Right nasal bone, displaced  Sub total opacification of right maxillary sinus.  Remaining paranasal sinuses clear. No left facial fractures are definitely visualized. Nasal septal deviation to the left. Right facial soft tissue gas without mandibular fracture.  IMPRESSION: Fractures of the right zygoma, inferior lateral walls right orbit, right nasal bone, anterior and lateral walls right maxillary sinus extending into floor right maxillary sinus and alveolar ridge. No definite extraocular muscle entrapment is seen. Extensive right orbital pneumatosis.  CT CERVICAL SPINE  Findings: Diffuse disc space narrowing endplate spur formation, narrowing of the AP diameter of the spinal canal and encroaching upon multiple bilateral cervical neural foramina. Prevertebral soft tissues normal thickness. Mild scattered facet degenerative changes greatest at right C3-C4. Visualized skull base intact. Scattered carotid arterial calcifications. No definite cervical spine fracture identified.  IMPRESSION: Multilevel degenerative disc disease changes of the cervical spine with scattered AP narrowing of the spinal canal encroachment upon cervical foramina. Minimal facet degenerative changes greatest at right C3-C4. No acute cervical spine fracture or subluxation.   Original Report Authenticated By: Ulyses Southward, M.D.     EKG:  NSR with LVH and early repolarization  ASSESSMENT:  1.  Syncope of ? Etiology - diff dx:  Orthostatic hypotension, arrhythmia, coronary  ischemia, seizure 2.  HTN poorly controlled but ran out of meds several months ago 3.  Marked LVH on EKG 4.  Left orbital fracture with large hematoma 5.  Hypokalemia 6.  Acute renal insufficiency  PLAN:   1.  Cycle cardiac enzymes 2.  Check 2D echo to evaluate LVF 3.  Further workup pending results of echo - if echo normal recommend nuclear stress test to rule out ischemia and [employer] heart monitor for 1 month 4.  Replete potassium 5.  Aggressive BP management   Quintella Reichert, MD  08/02/2012  6:04 PM

## 2012-08-02 NOTE — ED Notes (Signed)
EKG given to DR.Belfi

## 2012-08-02 NOTE — Consult Note (Signed)
Mendez, Austin [id number] Oct 26, 1960 [place], MD  Reason for Consult: fall with right facial fractures  HPI: 52yo who was working in hot weather today and had syncopal episode. He fell and struck right side of face. CT maxillofacial was reviewed by me and shows a moderately displaced left orbital floor fracture with ~3-4 mm of inferior displacement in the mid-orbital floor but intact anterior and posterior orbital floor/rim, with mildly displaced right sided nasal bone fracture and right nondisplaced zygomatic and maxillary wall fractures. ENT consulted for the facial fractures.  Allergies: No Known Allergies  ROS: facial pain and swelling, otherwise negative x 10 systems except per HPI  PMH:  Past Medical History  Diagnosis Date  . Hypertension     FH: History reviewed. No pertinent family history.  SH:  History   Social History  . Marital Status: Married    Spouse Name: N/A    Number of Children: N/A  . Years of Education: N/A   Occupational History  . Not on file.   Social History Main Topics  . Smoking status: Former Games developer  . Smokeless tobacco: Not on file  . Alcohol Use: Yes     Comment: Smells of ETOH  St's just stopped drinking  . Drug Use: Yes    Special: Marijuana  . Sexually Active: Not on file     Comment: St's stopped 2 yrs ago   Other Topics Concern  . Not on file   Social History Narrative  . No narrative on file    PSH: History reviewed. No pertinent past surgical history.  Physical  Exam: CN 2-12 grossly intact and symmetric. EAC/TMs normal BL. Oral cavity, lips, gums, ororpharynx normal with no masses or lesions, class I occlusion. Skin warm and dry. Nasal cavity without polyps or purulence. External nose and ears without masses or lesions. External nose shows a midline nasal tip and dorsum with a midline nose and no significant deviation or cosmetic deformity. Right cheeck and periorbital region show significant edema, ecchymosis, and  tenderness but with right eyelids opened EOMI, PERRLA OU with no entrapment. Neck supple with no masses or lesions.    A/P: right tetrapod fracture/nasal bone fracture with minimal to mild displacement. No significant cosmetic deformity and orbital floor has not entrapped R EOM so recommend non operative management with cool compresses and observation, fractures should heal with observation in 4-6 weeks. Can follow up with ENT as needed.   Melvenia Beam 08/02/2012 6:24 PM

## 2012-08-02 NOTE — ED Notes (Signed)
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 Started feeling dizzy and weak, pt stood up and coworkers heard him fall and when they got to him he was conscious. Pt reports that he doesn't remember falling. Swelling noted to right side of face. Small abrasion to right side of forehead. Some ST elevation noted to 12-lead. Denies neck or back pain. Pt passed CCA. Pt c/o weakness and pain to face. Pt reports nausea and cough for the past few days. Initial BP palpated 108/ and HR increased to 115 upon standing.

## 2012-08-02 NOTE — ED Notes (Signed)
Right side periorbital edema, laceration noted to right upper lip with scant bleeding. No other injuries noted. Pt denies CP SOB but states he was SOB before falling. Pt thinks he passed out because he has not had his BP medication for two days.

## 2012-08-02 NOTE — H&P (Signed)
[facility] History and Physical  Austin Mendez ZOX:[id number] DOB: November 16, 1960 DOA: 08/02/2012  Referring physician: Dr. Fredderick Phenix PCP: No PCP at present  Chief Complaint:  Syncope x 1 day  HPI:  52 year old male with history of hypertension, alcohol abuse, tobacco abuse who was at work today when he syncopized and landed on the floor on his face. He informs waking up almost 2-3 minutes later and feeling slightly dizzy. He realized that he had smashed his face on the ground. He had some pain over the face but denied any pain or difficulty getting up and ambulating. He feels slightly dizzy prior to the episode but denies any chest pain, palpitations, shortness of breath, headache, bloody vision, nausea, vomiting, abdominal pain, bowel or urinary symptoms. He feels he may have had a urinary incontinence during the event. He denies similar symptoms in the past. Reports last alcohol use to be 3-4 days back. He informs being on blood pressure medication for his hypertension but because of his lack of intravenous he has not been able to see his PCP or refill his medication for almost 3 months. He denies any weakness of his arms or legs.  Course in the ED Patient brought to the ED and noted to have uncontrolled hypertension with blood pressure of 192/116. Patient noted to have ecchymosis over right eye and a small laceration over the right upper lip. Blood work was done showing normal CBC, mild hypokalemia, and acute kidney injury . Head CT done showed chronic ischemic vessel changes. CT scan of the face done showing multiple facial bone fractures without extraocular muscle entrapment. EKG done in the ED showing some ST elevation in V2 and V3 which seems to be similar to his previous EKG from 2013. Prior hospitalists called for admission to telemetry for syncope and uncontrolled hypertension. Cardiology and ENT being consulted by the ED.  Review of Systems:  Constitutional: Denies fever, chills,  diaphoresis, appetite change and fatigue.  HEENT: Complains of pain over right orbital area and face Denies photophobia, redness, hearing loss, ear pain, congestion, sore throat, rhinorrhea, sneezing, mouth sores, trouble swallowing, neck pain, neck stiffness and tinnitus.   Respiratory: Informs of some shortness of breath on exertion which has been going on for last few months. Denies cough, chest tightness,  and wheezing.   Cardiovascular: Denies chest pain, palpitations and leg swelling.  Gastrointestinal: Denies nausea, vomiting, abdominal pain, diarrhea, constipation, blood in stool and abdominal distention.  Genitourinary: Denies dysuria, urgency, frequency, hematuria, flank pain and difficulty urinating.  Musculoskeletal: Denies myalgias, back pain, joint swelling, arthralgias and gait problem.  Neurological: Syncope present, Denies dizziness, seizures,  weakness, light-headedness, numbness and headaches.  Hematological: Denies adenopathy. Easy bruising, personal or family bleeding history  Psychiatric/Behavioral: Denies suicidal ideation, mood changes, confusion, nervousness, sleep disturbance and agitation   Past Medical History  Diagnosis Date  . Hypertension    History reviewed. No pertinent past surgical history. Social History:  Smokes 3-4 cigarettes a day. He does not have any smokeless tobacco history on file. He reports that  drinks alcohol. He reports that he uses illicit drugs (Marijuana).  No Known Allergies  History reviewed. No pertinent family history.  Prior to Admission medications   Medication Sig Start Date End Date Taking? Authorizing Provider  lisinopril (PRINIVIL,ZESTRIL) 40 MG tablet Take 40 mg by mouth daily.    Yes Historical Provider, MD    Physical Exam:  Filed Vitals:   08/02/12 1200 08/02/12 1215 08/02/12 1330 08/02/12 1345  BP: 156/110  146/88 192/116 189/123  Pulse: 88 89    Temp:      Resp: 16 20 19 17   SpO2: 97% 96%      Constitutional:  Vital signs reviewed.  Patient is a well-developed and well-nourished in no acute distress and cooperative with exam. Alert and oriented x3.  Head: Normocephalic and atraumatic,  Mouth: Small laceration of right upper lip, no erythema or exudates, MMM, poor dentition Eyes: Significant right supraorbital and infraorbital ecchymosis with swelling,  PERRL, EOMI, conjunctivae normal, No scleral icterus.  Neck: Supple, Trachea midline normal ROM, No JVD, mass, thyromegaly, or carotid bruit present.  Cardiovascular: RRR, S1 normal, S2 normal, no MRG, pulses symmetric and intact bilaterally Pulmonary/Chest: CTAB, no wheezes, rales, or rhonchi Abdominal: Soft. Non-tender, non-distended, bowel sounds are normal, no masses, organomegaly, or guarding present.  GU: no CVA tenderness Musculoskeletal: No joint deformities, erythema, or stiffness, ROM full and no nontender Ext: no edema and no cyanosis, pulses palpable bilaterally  Hematology: no cervical adenopathy.  Neurological: A&O x3, Strenght is normal and symmetric bilaterally, cranial nerve II-XII are grossly intact, no focal motor deficit, sensory intact to light touch bilaterally.  Skin: Warm, dry and intact. No rash, cyanosis, or clubbing.  Psychiatric: Normal mood and affect. speech and behavior is normal. Judgment and thought content normal. Cognition and memory are normal.   Labs on Admission:  Basic Metabolic Panel:  Recent Labs Lab 08/02/12 1413  NA 134*  K 3.2*  CL 93*  GLUCOSE 92  BUN 25*  CREATININE 1.80*   Liver Function Tests: No results found for this basename: AST, ALT, ALKPHOS, BILITOT, PROT, ALBUMIN,  in the last 168 hours No results found for this basename: LIPASE, AMYLASE,  in the last 168 hours No results found for this basename: AMMONIA,  in the last 168 hours CBC:  Recent Labs Lab 08/02/12 1206 08/02/12 1413  WBC 6.1  --   NEUTROABS 4.5  --   HGB 16.8 21.4*  HCT 45.4 63.0*  MCV 91.0  --   PLT 157  --     Cardiac Enzymes: No results found for this basename: CKTOTAL, CKMB, CKMBINDEX, TROPONINI,  in the last 168 hours BNP: No components found with this basename: POCBNP,  CBG: No results found for this basename: GLUCAP,  in the last 168 hours  Radiological Exams on Admission: Ct Head Wo Contrast  08/02/2012  *RADIOLOGY REPORT*  Clinical Data:  Syncope, fall, right facial swelling, frontal abrasion, facial pain, weakness, nausea and cough for a couple days  CT HEAD WITHOUT CONTRAST CT MAXILLOFACIAL WITHOUT CONTRAST CT CERVICAL SPINE WITHOUT CONTRAST  Technique:  Multidetector CT imaging of the head, cervical spine, and maxillofacial structures were performed using the standard protocol without intravenous contrast. Multiplanar CT image reconstructions of the cervical spine and maxillofacial structures were also generated.  Comparison:  CT head 01/18/2011  CT HEAD  Findings: Minimal atrophy. Normal ventricular morphology. No midline shift or mass effect. Chronic small vessel ischemic changes of deep cerebral white matter. No intracranial hemorrhage, mass lesion, or evidence of acute infarction. Low attenuation at the pons likely related to beam hardening artifacts from skull base. No extra-axial fluid collections. Right maxillary sinus and orbital findings, reported separately below. Calvaria intact.  IMPRESSION: Small vessel chronic ischemic changes of deep cerebral white matter. No acute intracranial abnormalities.  CT MAXILLOFACIAL  Findings: Extensive right orbital pneumatosis with additional soft tissue gas at right infratemporal fossa. No pneumocephalus. Multiple facial bone fractures including: Right zygoma at near  right temporal bone confluence. Inferior wall right orbit with displaced fragment into superior right maxillary sinus. Lateral wall right orbit. Anterior and lateral walls right maxillary sinus extending into floor of right maxillary sinus and into the alveolar ridge. Right nasal bone,  displaced  Sub total opacification of right maxillary sinus.  Remaining paranasal sinuses clear. No left facial fractures are definitely visualized. Nasal septal deviation to the left. Right facial soft tissue gas without mandibular fracture.  IMPRESSION: Fractures of the right zygoma, inferior lateral walls right orbit, right nasal bone, anterior and lateral walls right maxillary sinus extending into floor right maxillary sinus and alveolar ridge. No definite extraocular muscle entrapment is seen. Extensive right orbital pneumatosis.  CT CERVICAL SPINE  Findings: Diffuse disc space narrowing endplate spur formation, narrowing of the AP diameter of the spinal canal and encroaching upon multiple bilateral cervical neural foramina. Prevertebral soft tissues normal thickness. Mild scattered facet degenerative changes greatest at right C3-C4. Visualized skull base intact. Scattered carotid arterial calcifications. No definite cervical spine fracture identified.  IMPRESSION: Multilevel degenerative disc disease changes of the cervical spine with scattered AP narrowing of the spinal canal encroachment upon cervical foramina. Minimal facet degenerative changes greatest at right C3-C4. No acute cervical spine fracture or subluxation.   Original Report Authenticated By: Ulyses Southward, M.D.    Ct Cervical Spine Wo Contrast  08/02/2012  *RADIOLOGY REPORT*  Clinical Data:  Syncope, fall, right facial swelling, frontal abrasion, facial pain, weakness, nausea and cough for a couple days  CT HEAD WITHOUT CONTRAST CT MAXILLOFACIAL WITHOUT CONTRAST CT CERVICAL SPINE WITHOUT CONTRAST  Technique:  Multidetector CT imaging of the head, cervical spine, and maxillofacial structures were performed using the standard protocol without intravenous contrast. Multiplanar CT image reconstructions of the cervical spine and maxillofacial structures were also generated.  Comparison:  CT head 01/18/2011  CT HEAD  Findings: Minimal atrophy. Normal  ventricular morphology. No midline shift or mass effect. Chronic small vessel ischemic changes of deep cerebral white matter. No intracranial hemorrhage, mass lesion, or evidence of acute infarction. Low attenuation at the pons likely related to beam hardening artifacts from skull base. No extra-axial fluid collections. Right maxillary sinus and orbital findings, reported separately below. Calvaria intact.  IMPRESSION: Small vessel chronic ischemic changes of deep cerebral white matter. No acute intracranial abnormalities.  CT MAXILLOFACIAL  Findings: Extensive right orbital pneumatosis with additional soft tissue gas at right infratemporal fossa. No pneumocephalus. Multiple facial bone fractures including: Right zygoma at near right temporal bone confluence. Inferior wall right orbit with displaced fragment into superior right maxillary sinus. Lateral wall right orbit. Anterior and lateral walls right maxillary sinus extending into floor of right maxillary sinus and into the alveolar ridge. Right nasal bone, displaced  Sub total opacification of right maxillary sinus.  Remaining paranasal sinuses clear. No left facial fractures are definitely visualized. Nasal septal deviation to the left. Right facial soft tissue gas without mandibular fracture.  IMPRESSION: Fractures of the right zygoma, inferior lateral walls right orbit, right nasal bone, anterior and lateral walls right maxillary sinus extending into floor right maxillary sinus and alveolar ridge. No definite extraocular muscle entrapment is seen. Extensive right orbital pneumatosis.  CT CERVICAL SPINE  Findings: Diffuse disc space narrowing endplate spur formation, narrowing of the AP diameter of the spinal canal and encroaching upon multiple bilateral cervical neural foramina. Prevertebral soft tissues normal thickness. Mild scattered facet degenerative changes greatest at right C3-C4. Visualized skull base intact. Scattered carotid arterial calcifications.  No  definite cervical spine fracture identified.  IMPRESSION: Multilevel degenerative disc disease changes of the cervical spine with scattered AP narrowing of the spinal canal encroachment upon cervical foramina. Minimal facet degenerative changes greatest at right C3-C4. No acute cervical spine fracture or subluxation.   Original Report Authenticated By: Ulyses Southward, M.D.    Dg Chest Port 1 View  08/02/2012  *RADIOLOGY REPORT*  Clinical Data: Syncope, hypertension, smoker.  PORTABLE CHEST - 1 VIEW  Comparison: 01/18/2011.  Findings: Trachea is midline.  Heart size within normal limits. Lungs appear hyperinflated but clear.  No pleural fluid.  IMPRESSION: No acute findings.   Original Report Authenticated By: Leanna Battles, M.D.    Ct Maxillofacial Wo Cm  08/02/2012  *RADIOLOGY REPORT*  Clinical Data:  Syncope, fall, right facial swelling, frontal abrasion, facial pain, weakness, nausea and cough for a couple days  CT HEAD WITHOUT CONTRAST CT MAXILLOFACIAL WITHOUT CONTRAST CT CERVICAL SPINE WITHOUT CONTRAST  Technique:  Multidetector CT imaging of the head, cervical spine, and maxillofacial structures were performed using the standard protocol without intravenous contrast. Multiplanar CT image reconstructions of the cervical spine and maxillofacial structures were also generated.  Comparison:  CT head 01/18/2011  CT HEAD  Findings: Minimal atrophy. Normal ventricular morphology. No midline shift or mass effect. Chronic small vessel ischemic changes of deep cerebral white matter. No intracranial hemorrhage, mass lesion, or evidence of acute infarction. Low attenuation at the pons likely related to beam hardening artifacts from skull base. No extra-axial fluid collections. Right maxillary sinus and orbital findings, reported separately below. Calvaria intact.  IMPRESSION: Small vessel chronic ischemic changes of deep cerebral white matter. No acute intracranial abnormalities.  CT MAXILLOFACIAL  Findings:  Extensive right orbital pneumatosis with additional soft tissue gas at right infratemporal fossa. No pneumocephalus. Multiple facial bone fractures including: Right zygoma at near right temporal bone confluence. Inferior wall right orbit with displaced fragment into superior right maxillary sinus. Lateral wall right orbit. Anterior and lateral walls right maxillary sinus extending into floor of right maxillary sinus and into the alveolar ridge. Right nasal bone, displaced  Sub total opacification of right maxillary sinus.  Remaining paranasal sinuses clear. No left facial fractures are definitely visualized. Nasal septal deviation to the left. Right facial soft tissue gas without mandibular fracture.  IMPRESSION: Fractures of the right zygoma, inferior lateral walls right orbit, right nasal bone, anterior and lateral walls right maxillary sinus extending into floor right maxillary sinus and alveolar ridge. No definite extraocular muscle entrapment is seen. Extensive right orbital pneumatosis.  CT CERVICAL SPINE  Findings: Diffuse disc space narrowing endplate spur formation, narrowing of the AP diameter of the spinal canal and encroaching upon multiple bilateral cervical neural foramina. Prevertebral soft tissues normal thickness. Mild scattered facet degenerative changes greatest at right C3-C4. Visualized skull base intact. Scattered carotid arterial calcifications. No definite cervical spine fracture identified.  IMPRESSION: Multilevel degenerative disc disease changes of the cervical spine with scattered AP narrowing of the spinal canal encroachment upon cervical foramina. Minimal facet degenerative changes greatest at right C3-C4. No acute cervical spine fracture or subluxation.   Original Report Authenticated By: Ulyses Southward, M.D.     EKG: Normal sinus rhythm, ST elevation seen V2 V3 which appeared to be early repolarization, not much changed from his previous EKG from 2013  Assessment/Plan Principal  Problem:    Syncope and collapse Possibly in the setting of malignant hypertension versus seizures versus anemia Patient would be admitted to telemetry monitoring Cycle serial cardiac enzymes  will rule out ACS. He does not have any chest pain symptoms have some EKG changes. Cardiology being consulted by ED. Head CT negative. We'll obtain MRI brain and EEG. Check 2-D echo Continue neuro checks and seizure precautions. Check internal level and urine tox.    Active Problems:   Malignant hypertension Patient has not taken his blood pressure medications for several weeks. BP of 200s / 100s in ED. Ordered 20 mg IV labetalol. i will place him on hydralazine 25 mg tid and prn hydralazine q 4hr. Hold diuretic or ACE inhibitor given acute kidney injury.  Facial fracture Secondary to fall. Patient has sustained fracture of the right zygoma, inferior lateral walls all right orbit, right nasal bone, anterior and lateral walls of right maxillary sinus extending into floor of right medullary sinus and alveolar data. No findings of extraocular muscle entrapment. 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 ENT (Dr. Emeline Darling) being consulted by ED. Pain control with when necessary Vicodin. Added Augmentin twice a day. Patient denies any visual symptoms at this time.    Acute kidney injury Mode recent baseline renal function. Denies taking over-the-counter NSAIDs. Avoid nephrotoxins. We'll hydrate him with normal saline. Monitor renal function in the a.m.  Hypokalemia Replenish   Alcohol abuse He reports cutting down on his alcohol. Has not had a drink in 3 days. Informed drinking 2-3 times a week and binge drinking over the weekend. Check alcohol level. Will place on CIWA.    Tobacco abuse Consults smoking cessation   DVT prophylaxis: Subcutaneous Lovenox  Diet: Low-sodium.  Code Status: Full code Family Communication: None at bedside Disposition Plan: Currently patient  [name] Pager [phone]  If 7PM-7AM, please contact night-coverage [email or web address] Password [email or web address] 08/02/2012, 3:59 PM   Time spent: 70 minutes

## 2012-08-02 NOTE — ED Provider Notes (Addendum)
History     CSN: [id number]  Arrival date & time 08/02/12  1137   First MD Initiated Contact with Patient 08/02/12 1144      Chief Complaint  Patient presents with  . Loss of Consciousness    (Consider location/radiation/quality/duration/timing/severity/associated sxs/prior treatment) HPI Comments: Patient presents after having a syncopal episode. He has a history of hypertension and has been out of his medications for about 2-3 weeks. He was at work today working in the heat and got dizzy and weak and had a syncopal episode. He fell forward onto his face and complains of pain to his face in his job. He denies any vision changes. He denies any neck or back pain. He currently denies any chest pain or shortness of breath. He complains of a stomachache which I can't really differentiate between nausea or pain. He states he does feel nauseated at times but also has intermittent stomachache for the last 2-3 days. He denies any history of heart problems in the past. He denies any leg pain or swelling. He initially was orthostatic according to EMS. He states he was having some shortness of breath earlier today and working. He denies any numbness or weakness in his extremities. He denies any balance problems. He denies any slurred speech.  Patient is a 52 y.o. male presenting with syncope.  Loss of Consciousness  Associated symptoms include abdominal pain, light-headedness and nausea. Pertinent negatives include back pain, chest pain, congestion, diaphoresis, dizziness, fever, headaches, vomiting and weakness.    Past Medical History  Diagnosis Date  . Hypertension     History reviewed. No pertinent past surgical history.  History reviewed. No pertinent family history.  History  Substance Use Topics  . Smoking status: Former Games developer  . Smokeless tobacco: Not on file  . Alcohol Use: Yes     Comment: Smells of ETOH  St's just stopped drinking      Review of Systems  Constitutional:  Negative for fever, chills, diaphoresis and fatigue.  HENT: Positive for nosebleeds and facial swelling. Negative for congestion, rhinorrhea and sneezing.   Eyes: Negative.   Respiratory: Positive for shortness of breath. Negative for cough and chest tightness.   Cardiovascular: Positive for syncope. Negative for chest pain and leg swelling.  Gastrointestinal: Positive for nausea and abdominal pain. Negative for vomiting, diarrhea and blood in stool.  Genitourinary: Negative for frequency, hematuria, flank pain and difficulty urinating.  Musculoskeletal: Negative for back pain and arthralgias.  Skin: Positive for wound. Negative for rash.  Neurological: Positive for light-headedness. Negative for dizziness, speech difficulty, weakness, numbness and headaches.    Allergies  Review of patient's allergies indicates no known allergies.  Home Medications   Current Outpatient Rx  Name  Route  Sig  Dispense  Refill  . lisinopril (PRINIVIL,ZESTRIL) 40 MG tablet   Oral   Take 40 mg by mouth daily.            BP 179/95  Pulse 69  Temp(Src) 97.6 F (36.4 C)  Resp 17  SpO2 94%  Physical Exam  Constitutional: He is oriented to person, place, and time. He appears well-developed and well-nourished.  HENT:  Head: Normocephalic.  Patient has some facial swelling to his right infraorbital area. He has tenderness on palpation of the maxilla as well as the right mandible. He has no loose teeth. He does have very poor dentition which is chronic but there is no new broken or lost teeth. He has once in a laceration to the  right upper lip which does not cross the vermilion border.  Eyes: EOM are normal. Pupils are equal, round, and reactive to light.  No conjunctival erythema or injection.  Neck: Normal range of motion. Neck supple.  Very mild tenderness to the right mid neck. There is no pain over the thoracic or lumbosacral spine.  Cardiovascular: Normal rate, regular rhythm and normal heart  sounds.   Pulmonary/Chest: Effort normal and breath sounds normal. No respiratory distress. He has no wheezes. He has no rales. He exhibits no tenderness.  No signs of external trauma to the chest or abdomen  Abdominal: Soft. Bowel sounds are normal. There is no tenderness. There is no rebound and no guarding.  Musculoskeletal: Normal range of motion. He exhibits no edema.  No pain on palpation or range of motion extremities  Lymphadenopathy:    He has no cervical adenopathy.  Neurological: He is alert and oriented to person, place, and time. He has normal strength. A sensory deficit is present. No cranial nerve deficit. GCS eye subscore is 4. GCS verbal subscore is 5. GCS motor subscore is 6.  Skin: Skin is warm and dry. No rash noted.  Psychiatric: He has a normal mood and affect.    ED Course  LACERATION REPAIR Date/Time: 08/02/2012 4:00 PM Performed by: Keera Altidor Authorized by: Rolan Bucco Consent: Verbal consent obtained. Risks and benefits: risks, benefits and alternatives were discussed Consent given by: patient Patient identity confirmed: verbally with patient Time out: Immediately prior to procedure a "time out" was called to verify the correct patient, procedure, equipment, support staff and site/side marked as required. Body area: mouth Location details: upper lip, interior Laceration length: 1 cm Nerve involvement: none Vascular damage: no Anesthesia: local infiltration Local anesthetic: lidocaine 1% without epinephrine Anesthetic total: 1 ml Preparation: Patient was prepped and draped in the usual sterile fashion. Irrigation solution: saline Irrigation method: syringe Amount of cleaning: standard Debridement: none Degree of undermining: none Wound skin closure material used: 6-0 vicryl. Number of sutures: 3 Technique: simple Approximation: close Approximation difficulty: simple Comments: Closed outer aspect of lip, small residual lac left to inner lip.   Lac does not cross vermillian border   (including critical care time)  Results for orders placed during the hospital encounter of 08/02/12  CBC WITH DIFFERENTIAL      Result Value Range   WBC 6.1  4.0 - 10.5 K/uL   RBC 4.99  4.22 - 5.81 MIL/uL   Hemoglobin 16.8  13.0 - 17.0 g/dL   HCT 69.6  29.5 - 28.4 %   MCV 91.0  78.0 - 100.0 fL   MCH 33.7  26.0 - 34.0 pg   MCHC 37.0 (*) 30.0 - 36.0 g/dL   RDW 13.2  44.0 - 10.2 %   Platelets 157  150 - 400 K/uL   Neutrophils Relative 75  43 - 77 %   Neutro Abs 4.5  1.7 - 7.7 K/uL   Lymphocytes Relative 16  12 - 46 %   Lymphs Abs 1.0  0.7 - 4.0 K/uL   Monocytes Relative 8  3 - 12 %   Monocytes Absolute 0.5  0.1 - 1.0 K/uL   Eosinophils Relative 0  0 - 5 %   Eosinophils Absolute 0.0  0.0 - 0.7 K/uL   Basophils Relative 0  0 - 1 %   Basophils Absolute 0.0  0.0 - 0.1 K/uL  POCT I-STAT, CHEM 8      Result Value Range   Sodium  134 (*) 135 - 145 mEq/L   Potassium 3.2 (*) 3.5 - 5.1 mEq/L   Chloride 93 (*) 96 - 112 mEq/L   BUN 25 (*) 6 - 23 mg/dL   Creatinine, Ser 1.61 (*) 0.50 - 1.35 mg/dL   Glucose, Bld 92  70 - 99 mg/dL   Calcium, Ion 0.96  0.45 - 1.23 mmol/L   TCO2 30  0 - 100 mmol/L   Hemoglobin 21.4 (*) 13.0 - 17.0 g/dL   HCT 40.9 (*) 81.1 - 91.4 %   Comment NOTIFIED PHYSICIAN    POCT I-STAT TROPONIN I      Result Value Range   Troponin i, poc 0.01  0.00 - 0.08 ng/mL   Comment 3            Ct Head Wo Contrast  08/02/2012  *RADIOLOGY REPORT*  Clinical Data:  Syncope, fall, right facial swelling, frontal abrasion, facial pain, weakness, nausea and cough for a couple days  CT HEAD WITHOUT CONTRAST CT MAXILLOFACIAL WITHOUT CONTRAST CT CERVICAL SPINE WITHOUT CONTRAST  Technique:  Multidetector CT imaging of the head, cervical spine, and maxillofacial structures were performed using the standard protocol without intravenous contrast. Multiplanar CT image reconstructions of the cervical spine and maxillofacial structures were also generated.   Comparison:  CT head 01/18/2011  CT HEAD  Findings: Minimal atrophy. Normal ventricular morphology. No midline shift or mass effect. Chronic small vessel ischemic changes of deep cerebral white matter. No intracranial hemorrhage, mass lesion, or evidence of acute infarction. Low attenuation at the pons likely related to beam hardening artifacts from skull base. No extra-axial fluid collections. Right maxillary sinus and orbital findings, reported separately below. Calvaria intact.  IMPRESSION: Small vessel chronic ischemic changes of deep cerebral white matter. No acute intracranial abnormalities.  CT MAXILLOFACIAL  Findings: Extensive right orbital pneumatosis with additional soft tissue gas at right infratemporal fossa. No pneumocephalus. Multiple facial bone fractures including: Right zygoma at near right temporal bone confluence. Inferior wall right orbit with displaced fragment into superior right maxillary sinus. Lateral wall right orbit. Anterior and lateral walls right maxillary sinus extending into floor of right maxillary sinus and into the alveolar ridge. Right nasal bone, displaced  Sub total opacification of right maxillary sinus.  Remaining paranasal sinuses clear. No left facial fractures are definitely visualized. Nasal septal deviation to the left. Right facial soft tissue gas without mandibular fracture.  IMPRESSION: Fractures of the right zygoma, inferior lateral walls right orbit, right nasal bone, anterior and lateral walls right maxillary sinus extending into floor right maxillary sinus and alveolar ridge. No definite extraocular muscle entrapment is seen. Extensive right orbital pneumatosis.  CT CERVICAL SPINE  Findings: Diffuse disc space narrowing endplate spur formation, narrowing of the AP diameter of the spinal canal and encroaching upon multiple bilateral cervical neural foramina. Prevertebral soft tissues normal thickness. Mild scattered facet degenerative changes greatest at right  C3-C4. Visualized skull base intact. Scattered carotid arterial calcifications. No definite cervical spine fracture identified.  IMPRESSION: Multilevel degenerative disc disease changes of the cervical spine with scattered AP narrowing of the spinal canal encroachment upon cervical foramina. Minimal facet degenerative changes greatest at right C3-C4. No acute cervical spine fracture or subluxation.   Original Report Authenticated By: Ulyses Southward, M.D.    Ct Cervical Spine Wo Contrast  08/02/2012  *RADIOLOGY REPORT*  Clinical Data:  Syncope, fall, right facial swelling, frontal abrasion, facial pain, weakness, nausea and cough for a couple days  CT HEAD WITHOUT CONTRAST CT MAXILLOFACIAL  WITHOUT CONTRAST CT CERVICAL SPINE WITHOUT CONTRAST  Technique:  Multidetector CT imaging of the head, cervical spine, and maxillofacial structures were performed using the standard protocol without intravenous contrast. Multiplanar CT image reconstructions of the cervical spine and maxillofacial structures were also generated.  Comparison:  CT head 01/18/2011  CT HEAD  Findings: Minimal atrophy. Normal ventricular morphology. No midline shift or mass effect. Chronic small vessel ischemic changes of deep cerebral white matter. No intracranial hemorrhage, mass lesion, or evidence of acute infarction. Low attenuation at the pons likely related to beam hardening artifacts from skull base. No extra-axial fluid collections. Right maxillary sinus and orbital findings, reported separately below. Calvaria intact.  IMPRESSION: Small vessel chronic ischemic changes of deep cerebral white matter. No acute intracranial abnormalities.  CT MAXILLOFACIAL  Findings: Extensive right orbital pneumatosis with additional soft tissue gas at right infratemporal fossa. No pneumocephalus. Multiple facial bone fractures including: Right zygoma at near right temporal bone confluence. Inferior wall right orbit with displaced fragment into superior right  maxillary sinus. Lateral wall right orbit. Anterior and lateral walls right maxillary sinus extending into floor of right maxillary sinus and into the alveolar ridge. Right nasal bone, displaced  Sub total opacification of right maxillary sinus.  Remaining paranasal sinuses clear. No left facial fractures are definitely visualized. Nasal septal deviation to the left. Right facial soft tissue gas without mandibular fracture.  IMPRESSION: Fractures of the right zygoma, inferior lateral walls right orbit, right nasal bone, anterior and lateral walls right maxillary sinus extending into floor right maxillary sinus and alveolar ridge. No definite extraocular muscle entrapment is seen. Extensive right orbital pneumatosis.  CT CERVICAL SPINE  Findings: Diffuse disc space narrowing endplate spur formation, narrowing of the AP diameter of the spinal canal and encroaching upon multiple bilateral cervical neural foramina. Prevertebral soft tissues normal thickness. Mild scattered facet degenerative changes greatest at right C3-C4. Visualized skull base intact. Scattered carotid arterial calcifications. No definite cervical spine fracture identified.  IMPRESSION: Multilevel degenerative disc disease changes of the cervical spine with scattered AP narrowing of the spinal canal encroachment upon cervical foramina. Minimal facet degenerative changes greatest at right C3-C4. No acute cervical spine fracture or subluxation.   Original Report Authenticated By: Ulyses Southward, M.D.    Dg Chest Port 1 View  08/02/2012  *RADIOLOGY REPORT*  Clinical Data: Syncope, hypertension, smoker.  PORTABLE CHEST - 1 VIEW  Comparison: 01/18/2011.  Findings: Trachea is midline.  Heart size within normal limits. Lungs appear hyperinflated but clear.  No pleural fluid.  IMPRESSION: No acute findings.   Original Report Authenticated By: Leanna Battles, M.D.    Ct Maxillofacial Wo Cm  08/02/2012  *RADIOLOGY REPORT*  Clinical Data:  Syncope, fall, right  facial swelling, frontal abrasion, facial pain, weakness, nausea and cough for a couple days  CT HEAD WITHOUT CONTRAST CT MAXILLOFACIAL WITHOUT CONTRAST CT CERVICAL SPINE WITHOUT CONTRAST  Technique:  Multidetector CT imaging of the head, cervical spine, and maxillofacial structures were performed using the standard protocol without intravenous contrast. Multiplanar CT image reconstructions of the cervical spine and maxillofacial structures were also generated.  Comparison:  CT head 01/18/2011  CT HEAD  Findings: Minimal atrophy. Normal ventricular morphology. No midline shift or mass effect. Chronic small vessel ischemic changes of deep cerebral white matter. No intracranial hemorrhage, mass lesion, or evidence of acute infarction. Low attenuation at the pons likely related to beam hardening artifacts from skull base. No extra-axial fluid collections. Right maxillary sinus and orbital findings, reported separately  below. Calvaria intact.  IMPRESSION: Small vessel chronic ischemic changes of deep cerebral white matter. No acute intracranial abnormalities.  CT MAXILLOFACIAL  Findings: Extensive right orbital pneumatosis with additional soft tissue gas at right infratemporal fossa. No pneumocephalus. Multiple facial bone fractures including: Right zygoma at near right temporal bone confluence. Inferior wall right orbit with displaced fragment into superior right maxillary sinus. Lateral wall right orbit. Anterior and lateral walls right maxillary sinus extending into floor of right maxillary sinus and into the alveolar ridge. Right nasal bone, displaced  Sub total opacification of right maxillary sinus.  Remaining paranasal sinuses clear. No left facial fractures are definitely visualized. Nasal septal deviation to the left. Right facial soft tissue gas without mandibular fracture.  IMPRESSION: Fractures of the right zygoma, inferior lateral walls right orbit, right nasal bone, anterior and lateral walls right  maxillary sinus extending into floor right maxillary sinus and alveolar ridge. No definite extraocular muscle entrapment is seen. Extensive right orbital pneumatosis.  CT CERVICAL SPINE  Findings: Diffuse disc space narrowing endplate spur formation, narrowing of the AP diameter of the spinal canal and encroaching upon multiple bilateral cervical neural foramina. Prevertebral soft tissues normal thickness. Mild scattered facet degenerative changes greatest at right C3-C4. Visualized skull base intact. Scattered carotid arterial calcifications. No definite cervical spine fracture identified.  IMPRESSION: Multilevel degenerative disc disease changes of the cervical spine with scattered AP narrowing of the spinal canal encroachment upon cervical foramina. Minimal facet degenerative changes greatest at right C3-C4. No acute cervical spine fracture or subluxation.   Original Report Authenticated By: Ulyses Southward, M.D.       Date: 08/02/2012  Rate: 84  Rhythm: normal sinus rhythm  QRS Axis: normal  Intervals: normal  ST/T Wave abnormalities: ST elevations anteriorly  Conduction Disutrbances:none  Narrative Interpretation:   Old EKG Reviewed: changes noted   1. Syncope       MDM  12:12 Pt with ST elevation on EKG, more prominent that prior EKG anteriorly.  PT denies current CP or SOB.  Discussed with DR [place], the interventionalist on call who advised not to call a code STEMI, he felt this was more early repol changes.  Pt with facial fractures.  Discussed with Dr Emeline Darling who advised that as far as fractures, pt can be seen in the office tomorrow.  Given the patient's EKG changes associated with shortness of breath on exertion and a syncopal episode I feel the patient needs to be admitted for further cardiac evaluation. I discussed this with the hospitalist service who will admit the patient. I also notify Dr. Emeline Darling that the patient is going to be admitted to the hospital and he will consult in the  hospital.  Dr Mayford Knife with cards to see.    Rolan Bucco, MD 08/02/12 1652  Rolan Bucco, MD 08/02/12 1610

## 2012-08-03 ENCOUNTER — Inpatient Hospital Stay (HOSPITAL_COMMUNITY): Payer: Medicaid Other

## 2012-08-03 DIAGNOSIS — N179 Acute kidney failure, unspecified: Secondary | ICD-10-CM

## 2012-08-03 DIAGNOSIS — F1010 Alcohol abuse, uncomplicated: Secondary | ICD-10-CM

## 2012-08-03 DIAGNOSIS — E876 Hypokalemia: Secondary | ICD-10-CM

## 2012-08-03 DIAGNOSIS — R55 Syncope and collapse: Principal | ICD-10-CM

## 2012-08-03 LAB — BASIC METABOLIC PANEL
BUN: 15 mg/dL (ref 6–23)
CO2: 24 mEq/L (ref 19–32)
Calcium: 9.3 mg/dL (ref 8.4–10.5)
Creatinine, Ser: 0.99 mg/dL (ref 0.50–1.35)
Glucose, Bld: 92 mg/dL (ref 70–99)

## 2012-08-03 LAB — POCT I-STAT, CHEM 8
Calcium, Ion: 1.12 mmol/L (ref 1.12–1.23)
Glucose, Bld: 92 mg/dL (ref 70–99)
HCT: 63 % — ABNORMAL HIGH (ref 39.0–52.0)
Hemoglobin: 21.4 g/dL (ref 13.0–17.0)
Potassium: 3.2 mEq/L — ABNORMAL LOW (ref 3.5–5.1)
TCO2: 30 mmol/L (ref 0–100)

## 2012-08-03 LAB — CBC
MCH: 32.4 pg (ref 26.0–34.0)
MCHC: 36.1 g/dL — ABNORMAL HIGH (ref 30.0–36.0)
MCV: 89.9 fL (ref 78.0–100.0)
Platelets: 146 10*3/uL — ABNORMAL LOW (ref 150–400)
RDW: 14 % (ref 11.5–15.5)

## 2012-08-03 LAB — MAGNESIUM: Magnesium: 1.5 mg/dL (ref 1.5–2.5)

## 2012-08-03 MED ORDER — CLONIDINE HCL 0.1 MG PO TABS
0.1000 mg | ORAL_TABLET | Freq: Two times a day (BID) | ORAL | Status: DC
  Administered 2012-08-03 – 2012-08-05 (×5): 0.1 mg via ORAL
  Filled 2012-08-03 (×6): qty 1

## 2012-08-03 MED ORDER — POTASSIUM CHLORIDE CRYS ER 20 MEQ PO TBCR
40.0000 meq | EXTENDED_RELEASE_TABLET | Freq: Once | ORAL | Status: AC
  Administered 2012-08-03: 40 meq via ORAL
  Filled 2012-08-03: qty 1

## 2012-08-03 MED ORDER — METOPROLOL SUCCINATE ER 25 MG PO TB24
25.0000 mg | ORAL_TABLET | Freq: Every day | ORAL | Status: DC
  Administered 2012-08-03 – 2012-08-05 (×3): 25 mg via ORAL
  Filled 2012-08-03 (×3): qty 1

## 2012-08-03 NOTE — Progress Notes (Signed)
  Echocardiogram 2D Echocardiogram has been performed.  Luddie Boghosian 08/03/2012, 10:29 AM

## 2012-08-03 NOTE — Progress Notes (Signed)
[facility] PROGRESS NOTE  Austin Mendez ZOX:[id number] DOB: 24-Mar-1961 DOA: 08/02/2012 PCP: Billee Cashing, MD  Assessment/Plan: Syncope  Cardiology following. Await echo.   Malignant hypertension  Patient has not taken his blood pressure medications for several weeks. BP of 200s / 100s in ED. Ordered 20 mg IV labetalol. Placed on hydralazine 25 mg tid and prn hydralazine q 4hr. Hold diuretic or ACE inhibitor given acute kidney injury. Clonidine 0.1 BID added 5/13 Facial fracture  Secondary to fall. Patient has sustained fracture of the right zygoma, inferior lateral walls all right orbit, right nasal bone, anterior and lateral walls of right maxillary sinus extending into floor of right medullary sinus and alveolar data. No findings of extraocular muscle entrapment. 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 ENT (Dr. Emeline Darling) being consulted by ED and he recommended non operative management .  Pain control with when necessary Vicodin. Augmentin twice a day started in the ED Acute kidney injury  Mode recent baseline renal function. Denies taking over-the-counter NSAIDs. Avoid nephrotoxins. Hydrated with normal saline after admission. Resolved by 08/03/12. Iv fluids stopped 5/13 Hypokalemia  Replete and recheck Alcohol abuse  He reports cutting down on his alcohol. Has not had a drink in 3 days. Informed drinking 2-3 times a week and binge drinking over the weekend. Check alcohol level. Will place on CIWA.      Code Status: full  Family Communication: patient  Disposition Plan: hme   Consultants:  Cardio - [facility]   HPI/Subjective: No facial pain   Objective: Filed Vitals:   08/02/12 2330 08/03/12 0000 08/03/12 0434 08/03/12 0725  BP: 170/73  199/93 196/101  Pulse:  72 74   Temp:   98.4 F (36.9 C)   TempSrc:   Oral   Resp:   18   Height:      Weight:      SpO2:   98%    Patient Vitals for the past 24 hrs:  BP Temp Temp src Pulse Resp SpO2 Height Weight   08/03/12 0725 196/101 mmHg - - - - - - -  08/03/12 0434 199/93 mmHg 98.4 F (36.9 C) Oral 74 18 98 % - -  08/03/12 0000 - - - 72 - - - -  08/02/12 2330 170/73 mmHg - - - - - - -  08/02/12 2052 209/98 mmHg 98.1 F (36.7 C) Oral 70 18 99 % - -  08/02/12 1800 198/99 mmHg - - 65 - - - -  08/02/12 1747 198/99 mmHg 97 F (36.1 C) Oral 66 18 95 % 5\' 6"  (1.676 m) 64 kg (141 lb 1.5 oz)  08/02/12 1641 179/95 mmHg - - 69 17 94 % - -  08/02/12 1638 160/101 mmHg - - 69 18 94 % - -  08/02/12 1630 - - - 68 18 96 % - -  08/02/12 1615 - - - 66 14 97 % - -  08/02/12 1600 - - - 71 14 97 % - -  08/02/12 1545 - - - - 16 - - -  08/02/12 1530 214/120 mmHg - - - 15 - - -  08/02/12 1515 - - - - 16 - - -  08/02/12 1500 214/120 mmHg - - - 15 - - -  08/02/12 1445 192/118 mmHg - - - 20 - - -  08/02/12 1430 186/127 mmHg - - - 17 - - -  08/02/12 1415 182/95 mmHg - - - 17 - - -  08/02/12 1400 161/96 mmHg - - -  16 - - -  08/02/12 1345 189/123 mmHg - - - 17 - - -  08/02/12 1330 192/116 mmHg - - - 19 - - -  08/02/12 1215 146/88 mmHg - - 89 20 96 % - -  08/02/12 1200 156/110 mmHg - - 88 16 97 % - -  08/02/12 1150 154/105 mmHg 97.6 F (36.4 C) - 88 22 100 % - -     Intake/Output Summary (Last 24 hours) at 08/03/12 0727 Last data filed at 08/03/12 0400  Gross per 24 hour  Intake   1505 ml  Output   1100 ml  Net    405 ml   Filed Weights   08/02/12 1747  Weight: 64 kg (141 lb 1.5 oz)    Exam:   General:  axox3  Cardiovascular: rrr  Respiratory: ctab   Abdomen: sof,nt   Musculoskeletal: ecchymosis of the right eye   Data Reviewed: Basic Metabolic Panel:  Recent Labs Lab 08/02/12 1413 08/02/12 1647 08/03/12 0540  NA 134*  --  134*  K 3.2*  --  3.4*  CL 93*  --  97  CO2  --   --  24  GLUCOSE 92  --  92  BUN 25*  --  15  CREATININE 1.80*  --  0.99  CALCIUM  --   --  9.3  MG  --  1.3*  --    Liver Function Tests: No results found for this basename: AST, ALT, ALKPHOS, BILITOT, PROT,  ALBUMIN,  in the last 168 hours No results found for this basename: LIPASE, AMYLASE,  in the last 168 hours No results found for this basename: AMMONIA,  in the last 168 hours CBC:  Recent Labs Lab 08/02/12 1206 08/02/12 1413 08/03/12 0540  WBC 6.1  --  7.1  NEUTROABS 4.5  --   --   HGB 16.8 21.4* 14.4  HCT 45.4 63.0* 39.9  MCV 91.0  --  89.9  PLT 157  --  146*   Cardiac Enzymes:  Recent Labs Lab 08/02/12 2001 08/03/12 0102  TROPONINI <0.30 <0.30   BNP (last 3 results) No results found for this basename: PROBNP,  in the last 8760 hours CBG: No results found for this basename: GLUCAP,  in the last 168 hours  No results found for this or any previous visit (from the past 240 hour(s)).   Studies: Ct Head Wo Contrast  08/02/2012  *RADIOLOGY REPORT*  Clinical Data:  Syncope, fall, right facial swelling, frontal abrasion, facial pain, weakness, nausea and cough for a couple days  CT HEAD WITHOUT CONTRAST CT MAXILLOFACIAL WITHOUT CONTRAST CT CERVICAL SPINE WITHOUT CONTRAST  Technique:  Multidetector CT imaging of the head, cervical spine, and maxillofacial structures were performed using the standard protocol without intravenous contrast. Multiplanar CT image reconstructions of the cervical spine and maxillofacial structures were also generated.  Comparison:  CT head 01/18/2011  CT HEAD  Findings: Minimal atrophy. Normal ventricular morphology. No midline shift or mass effect. Chronic small vessel ischemic changes of deep cerebral white matter. No intracranial hemorrhage, mass lesion, or evidence of acute infarction. Low attenuation at the pons likely related to beam hardening artifacts from skull base. No extra-axial fluid collections. Right maxillary sinus and orbital findings, reported separately below. Calvaria intact.  IMPRESSION: Small vessel chronic ischemic changes of deep cerebral white matter. No acute intracranial abnormalities.  CT MAXILLOFACIAL  Findings: Extensive right  orbital pneumatosis with additional soft tissue gas at right infratemporal fossa. No pneumocephalus. Multiple  facial bone fractures including: Right zygoma at near right temporal bone confluence. Inferior wall right orbit with displaced fragment into superior right maxillary sinus. Lateral wall right orbit. Anterior and lateral walls right maxillary sinus extending into floor of right maxillary sinus and into the alveolar ridge. Right nasal bone, displaced  Sub total opacification of right maxillary sinus.  Remaining paranasal sinuses clear. No left facial fractures are definitely visualized. Nasal septal deviation to the left. Right facial soft tissue gas without mandibular fracture.  IMPRESSION: Fractures of the right zygoma, inferior lateral walls right orbit, right nasal bone, anterior and lateral walls right maxillary sinus extending into floor right maxillary sinus and alveolar ridge. No definite extraocular muscle entrapment is seen. Extensive right orbital pneumatosis.  CT CERVICAL SPINE  Findings: Diffuse disc space narrowing endplate spur formation, narrowing of the AP diameter of the spinal canal and encroaching upon multiple bilateral cervical neural foramina. Prevertebral soft tissues normal thickness. Mild scattered facet degenerative changes greatest at right C3-C4. Visualized skull base intact. Scattered carotid arterial calcifications. No definite cervical spine fracture identified.  IMPRESSION: Multilevel degenerative disc disease changes of the cervical spine with scattered AP narrowing of the spinal canal encroachment upon cervical foramina. Minimal facet degenerative changes greatest at right C3-C4. No acute cervical spine fracture or subluxation.   Original Report Authenticated By: Ulyses Southward, M.D.    Ct Cervical Spine Wo Contrast  08/02/2012  *RADIOLOGY REPORT*  Clinical Data:  Syncope, fall, right facial swelling, frontal abrasion, facial pain, weakness, nausea and cough for a couple days   CT HEAD WITHOUT CONTRAST CT MAXILLOFACIAL WITHOUT CONTRAST CT CERVICAL SPINE WITHOUT CONTRAST  Technique:  Multidetector CT imaging of the head, cervical spine, and maxillofacial structures were performed using the standard protocol without intravenous contrast. Multiplanar CT image reconstructions of the cervical spine and maxillofacial structures were also generated.  Comparison:  CT head 01/18/2011  CT HEAD  Findings: Minimal atrophy. Normal ventricular morphology. No midline shift or mass effect. Chronic small vessel ischemic changes of deep cerebral white matter. No intracranial hemorrhage, mass lesion, or evidence of acute infarction. Low attenuation at the pons likely related to beam hardening artifacts from skull base. No extra-axial fluid collections. Right maxillary sinus and orbital findings, reported separately below. Calvaria intact.  IMPRESSION: Small vessel chronic ischemic changes of deep cerebral white matter. No acute intracranial abnormalities.  CT MAXILLOFACIAL  Findings: Extensive right orbital pneumatosis with additional soft tissue gas at right infratemporal fossa. No pneumocephalus. Multiple facial bone fractures including: Right zygoma at near right temporal bone confluence. Inferior wall right orbit with displaced fragment into superior right maxillary sinus. Lateral wall right orbit. Anterior and lateral walls right maxillary sinus extending into floor of right maxillary sinus and into the alveolar ridge. Right nasal bone, displaced  Sub total opacification of right maxillary sinus.  Remaining paranasal sinuses clear. No left facial fractures are definitely visualized. Nasal septal deviation to the left. Right facial soft tissue gas without mandibular fracture.  IMPRESSION: Fractures of the right zygoma, inferior lateral walls right orbit, right nasal bone, anterior and lateral walls right maxillary sinus extending into floor right maxillary sinus and alveolar ridge. No definite  extraocular muscle entrapment is seen. Extensive right orbital pneumatosis.  CT CERVICAL SPINE  Findings: Diffuse disc space narrowing endplate spur formation, narrowing of the AP diameter of the spinal canal and encroaching upon multiple bilateral cervical neural foramina. Prevertebral soft tissues normal thickness. Mild scattered facet degenerative changes greatest at right C3-C4. Visualized skull  base intact. Scattered carotid arterial calcifications. No definite cervical spine fracture identified.  IMPRESSION: Multilevel degenerative disc disease changes of the cervical spine with scattered AP narrowing of the spinal canal encroachment upon cervical foramina. Minimal facet degenerative changes greatest at right C3-C4. No acute cervical spine fracture or subluxation.   Original Report Authenticated By: Ulyses Southward, M.D.    Dg Chest Port 1 View  08/02/2012  *RADIOLOGY REPORT*  Clinical Data: Syncope, hypertension, smoker.  PORTABLE CHEST - 1 VIEW  Comparison: 01/18/2011.  Findings: Trachea is midline.  Heart size within normal limits. Lungs appear hyperinflated but clear.  No pleural fluid.  IMPRESSION: No acute findings.   Original Report Authenticated By: Leanna Battles, M.D.    Ct Maxillofacial Wo Cm  08/02/2012  *RADIOLOGY REPORT*  Clinical Data:  Syncope, fall, right facial swelling, frontal abrasion, facial pain, weakness, nausea and cough for a couple days  CT HEAD WITHOUT CONTRAST CT MAXILLOFACIAL WITHOUT CONTRAST CT CERVICAL SPINE WITHOUT CONTRAST  Technique:  Multidetector CT imaging of the head, cervical spine, and maxillofacial structures were performed using the standard protocol without intravenous contrast. Multiplanar CT image reconstructions of the cervical spine and maxillofacial structures were also generated.  Comparison:  CT head 01/18/2011  CT HEAD  Findings: Minimal atrophy. Normal ventricular morphology. No midline shift or mass effect. Chronic small vessel ischemic changes of deep  cerebral white matter. No intracranial hemorrhage, mass lesion, or evidence of acute infarction. Low attenuation at the pons likely related to beam hardening artifacts from skull base. No extra-axial fluid collections. Right maxillary sinus and orbital findings, reported separately below. Calvaria intact.  IMPRESSION: Small vessel chronic ischemic changes of deep cerebral white matter. No acute intracranial abnormalities.  CT MAXILLOFACIAL  Findings: Extensive right orbital pneumatosis with additional soft tissue gas at right infratemporal fossa. No pneumocephalus. Multiple facial bone fractures including: Right zygoma at near right temporal bone confluence. Inferior wall right orbit with displaced fragment into superior right maxillary sinus. Lateral wall right orbit. Anterior and lateral walls right maxillary sinus extending into floor of right maxillary sinus and into the alveolar ridge. Right nasal bone, displaced  Sub total opacification of right maxillary sinus.  Remaining paranasal sinuses clear. No left facial fractures are definitely visualized. Nasal septal deviation to the left. Right facial soft tissue gas without mandibular fracture.  IMPRESSION: Fractures of the right zygoma, inferior lateral walls right orbit, right nasal bone, anterior and lateral walls right maxillary sinus extending into floor right maxillary sinus and alveolar ridge. No definite extraocular muscle entrapment is seen. Extensive right orbital pneumatosis.  CT CERVICAL SPINE  Findings: Diffuse disc space narrowing endplate spur formation, narrowing of the AP diameter of the spinal canal and encroaching upon multiple bilateral cervical neural foramina. Prevertebral soft tissues normal thickness. Mild scattered facet degenerative changes greatest at right C3-C4. Visualized skull base intact. Scattered carotid arterial calcifications. No definite cervical spine fracture identified.  IMPRESSION: Multilevel degenerative disc disease  changes of the cervical spine with scattered AP narrowing of the spinal canal encroachment upon cervical foramina. Minimal facet degenerative changes greatest at right C3-C4. No acute cervical spine fracture or subluxation.   Original Report Authenticated By: Ulyses Southward, M.D.     Scheduled Meds: . amoxicillin-clavulanate  1 tablet Oral BID  . aspirin EC  81 mg Oral Daily  . enoxaparin (LOVENOX) injection  40 mg Subcutaneous Q24H  . folic acid  1 mg Oral Daily  . hydrALAZINE  25 mg Oral Q8H  . multivitamin with  minerals  1 tablet Oral Daily  . sodium chloride  3 mL Intravenous Q12H  . thiamine  100 mg Oral Daily   Or  . thiamine  100 mg Intravenous Daily   Continuous Infusions: . sodium chloride 100 mL/hr at 08/03/12 0400    Principal Problem:   Syncope Active Problems:   ALCOHOL ABUSE   TOBACCO ABUSE   Malignant hypertension   Syncope and collapse   Facial fracture due to fall   Hypokalemia   Acute kidney injury      Arisha Gervais  [facility] Pager [phone]. If 7PM-7AM, please contact night-coverage at [email or web address], password [facility] 08/03/2012, 7:27 AM  LOS: 1 day

## 2012-08-03 NOTE — Progress Notes (Signed)
Utilization review completed. Chrysta Fulcher, RN, BSN. 

## 2012-08-03 NOTE — Progress Notes (Signed)
EEG completed.

## 2012-08-03 NOTE — Care Management Note (Signed)
    Page 1 of 2   08/05/2012     4:05:48 PM   CARE MANAGEMENT NOTE 08/05/2012  Patient:  Austin Mendez, Austin Mendez   Account Number:  [id number]  Date Initiated:  08/03/2012  Documentation initiated by:  Prentiss Hammett  Subjective/Objective Assessment:   PT ADM ON 08/02/12 S/P SYNCOPE WITH FALL RESULTING IN MULTIPLE FACIAL FRACTURES.  PTA, PT INDEPENDENT OF ADLS.     Action/Plan:   WILL FOLLOW FOR HOME NEEDS AS PT PROGRESSES.  PT IS UNINSURED.  MAY BE ELIGIBLE FOR MATCH PROGRAM FOR MEDICATION ASSISTANCE.   Anticipated DC Date:  08/05/2012   Anticipated DC Plan:  HOME/SELF CARE  In-house referral  Financial Counselor      DC Planning Services  CM consult  MATCH Program      Choice offered to / List presented to:             Status of service:  Completed, signed off Medicare Important Message given?   (If response is "NO", the following Medicare IM given date fields will be blank) Date Medicare IM given:   Date Additional Medicare IM given:    Discharge Disposition:  HOME/SELF CARE  Per UR Regulation:  Reviewed for med. necessity/level of care/duration of stay  If discussed at Long Length of Stay Meetings, dates discussed:    Comments:  08/05/12 Jettson Crable,RN,BSN [phone] PT FOR DC HOME TODAY.  NEEDS OP O.T. FOLLOW UP.  REFERRAL FORM COMPLETED.  WILL HAVE MD SIGN AND FAX TO [facility] FOR FOLLOW UP.  FINANCIAL COUNSELOR CALLED PT THIS AM AND ARRANGED APPT FOR FRIDAY AT 1PM TO ASSIST WITH MCAID AND DISABILITY APPLICATIONS.  PT GIVEN MATCH LETTER WITH EXPLANATION OF PROGRAM BENEFITS.  08/04/12 Lin Hackmann,RN,BSN [phone] MET WITH PT TO DISCUSS DC PLANS.  PT STATES HAS NOT BEEN TAKING BP MEDS DUE TO FINANCIAL ISSUES.  HE IS INTERESTED IN APPLYING FOR DISABILITY, AS HE STATES HIS HEALTH PREVENTS HIM FROM GETTING JOBS, AND HE CONTINUES TO PASS OUT, WHICH MAKES HIM A HIRE RISK.  REFERRAL TO FINANCIAL COUNSELOR FOR ASSISTANCE WITH DISABILITY OPTIONS.  PT IS ELIGIBLE FOR MATCH PROGRAM.   WILL PROVIDE MATCH LETTER PRIOR TO DC; WILL OVERRIDE COPAYS, AS PT STATES HE HAS NO MONEY.  ENCOURAGED PT TO MAKE APPT WITH HIS PCP, DR Billee Cashing, ASAP.  HE STATES HE WILL DO THIS.

## 2012-08-03 NOTE — Progress Notes (Signed)
SUBJECTIVE:  No complaints of dizziness today  OBJECTIVE:   Vitals:   Filed Vitals:   08/02/12 2330 08/03/12 0000 08/03/12 0434 08/03/12 0725  BP: 170/73  199/93 196/101  Pulse:  72 74   Temp:   98.4 F (36.9 C)   TempSrc:   Oral   Resp:   18   Height:      Weight:      SpO2:   98%    I&O's:   Intake/Output Summary (Last 24 hours) at 08/03/12 1610 Last data filed at 08/03/12 0400  Gross per 24 hour  Intake   1505 ml  Output   1100 ml  Net    405 ml   TELEMETRY: Reviewed telemetry pt in NSR:     PHYSICAL EXAM General: Well developed, well nourished, in no acute distress Head: swollen and ecchymotic right eye Lungs:   Clear bilaterally to auscultation and percussion. Heart:   HRRR S1 S2 Pulses are 2+ & equal. Abdomen: Bowel sounds are positive, abdomen soft and non-tender without masses Extremities:   No clubbing, cyanosis or edema.  DP +1 Neuro: Alert and oriented X 3. Psych:  Good affect, responds appropriately   LABS: Basic Metabolic Panel:  Recent Labs  96/04/54 1413 08/02/12 1647 08/03/12 0540  NA 134*  --  134*  K 3.2*  --  3.4*  CL 93*  --  97  CO2  --   --  24  GLUCOSE 92  --  92  BUN 25*  --  15  CREATININE 1.80*  --  0.99  CALCIUM  --   --  9.3  MG  --  1.3*  --    Liver Function Tests: No results found for this basename: AST, ALT, ALKPHOS, BILITOT, PROT, ALBUMIN,  in the last 72 hours No results found for this basename: LIPASE, AMYLASE,  in the last 72 hours CBC:  Recent Labs  08/02/12 1206 08/02/12 1413 08/03/12 0540  WBC 6.1  --  7.1  NEUTROABS 4.5  --   --   HGB 16.8 21.4* 14.4  HCT 45.4 63.0* 39.9  MCV 91.0  --  89.9  PLT 157  --  146*   Cardiac Enzymes:  Recent Labs  08/02/12 2001 08/03/12 0102  TROPONINI <0.30 <0.30   Coag Panel:   Lab Results  Component Value Date   INR 1.03 10/18/2009   INR 0.98 10/17/2009    RADIOLOGY: Ct Head Wo Contrast  08/02/2012  *RADIOLOGY REPORT*  Clinical Data:  Syncope, fall, right  facial swelling, frontal abrasion, facial pain, weakness, nausea and cough for a couple days  CT HEAD WITHOUT CONTRAST CT MAXILLOFACIAL WITHOUT CONTRAST CT CERVICAL SPINE WITHOUT CONTRAST  Technique:  Multidetector CT imaging of the head, cervical spine, and maxillofacial structures were performed using the standard protocol without intravenous contrast. Multiplanar CT image reconstructions of the cervical spine and maxillofacial structures were also generated.  Comparison:  CT head 01/18/2011  CT HEAD  Findings: Minimal atrophy. Normal ventricular morphology. No midline shift or mass effect. Chronic small vessel ischemic changes of deep cerebral white matter. No intracranial hemorrhage, mass lesion, or evidence of acute infarction. Low attenuation at the pons likely related to beam hardening artifacts from skull base. No extra-axial fluid collections. Right maxillary sinus and orbital findings, reported separately below. Calvaria intact.  IMPRESSION: Small vessel chronic ischemic changes of deep cerebral white matter. No acute intracranial abnormalities.  CT MAXILLOFACIAL  Findings: Extensive right orbital pneumatosis with additional soft tissue  gas at right infratemporal fossa. No pneumocephalus. Multiple facial bone fractures including: Right zygoma at near right temporal bone confluence. Inferior wall right orbit with displaced fragment into superior right maxillary sinus. Lateral wall right orbit. Anterior and lateral walls right maxillary sinus extending into floor of right maxillary sinus and into the alveolar ridge. Right nasal bone, displaced  Sub total opacification of right maxillary sinus.  Remaining paranasal sinuses clear. No left facial fractures are definitely visualized. Nasal septal deviation to the left. Right facial soft tissue gas without mandibular fracture.  IMPRESSION: Fractures of the right zygoma, inferior lateral walls right orbit, right nasal bone, anterior and lateral walls right  maxillary sinus extending into floor right maxillary sinus and alveolar ridge. No definite extraocular muscle entrapment is seen. Extensive right orbital pneumatosis.  CT CERVICAL SPINE  Findings: Diffuse disc space narrowing endplate spur formation, narrowing of the AP diameter of the spinal canal and encroaching upon multiple bilateral cervical neural foramina. Prevertebral soft tissues normal thickness. Mild scattered facet degenerative changes greatest at right C3-C4. Visualized skull base intact. Scattered carotid arterial calcifications. No definite cervical spine fracture identified.  IMPRESSION: Multilevel degenerative disc disease changes of the cervical spine with scattered AP narrowing of the spinal canal encroachment upon cervical foramina. Minimal facet degenerative changes greatest at right C3-C4. No acute cervical spine fracture or subluxation.   Original Report Authenticated By: Ulyses Southward, M.D.    Ct Cervical Spine Wo Contrast  08/02/2012  *RADIOLOGY REPORT*  Clinical Data:  Syncope, fall, right facial swelling, frontal abrasion, facial pain, weakness, nausea and cough for a couple days  CT HEAD WITHOUT CONTRAST CT MAXILLOFACIAL WITHOUT CONTRAST CT CERVICAL SPINE WITHOUT CONTRAST  Technique:  Multidetector CT imaging of the head, cervical spine, and maxillofacial structures were performed using the standard protocol without intravenous contrast. Multiplanar CT image reconstructions of the cervical spine and maxillofacial structures were also generated.  Comparison:  CT head 01/18/2011  CT HEAD  Findings: Minimal atrophy. Normal ventricular morphology. No midline shift or mass effect. Chronic small vessel ischemic changes of deep cerebral white matter. No intracranial hemorrhage, mass lesion, or evidence of acute infarction. Low attenuation at the pons likely related to beam hardening artifacts from skull base. No extra-axial fluid collections. Right maxillary sinus and orbital findings, reported  separately below. Calvaria intact.  IMPRESSION: Small vessel chronic ischemic changes of deep cerebral white matter. No acute intracranial abnormalities.  CT MAXILLOFACIAL  Findings: Extensive right orbital pneumatosis with additional soft tissue gas at right infratemporal fossa. No pneumocephalus. Multiple facial bone fractures including: Right zygoma at near right temporal bone confluence. Inferior wall right orbit with displaced fragment into superior right maxillary sinus. Lateral wall right orbit. Anterior and lateral walls right maxillary sinus extending into floor of right maxillary sinus and into the alveolar ridge. Right nasal bone, displaced  Sub total opacification of right maxillary sinus.  Remaining paranasal sinuses clear. No left facial fractures are definitely visualized. Nasal septal deviation to the left. Right facial soft tissue gas without mandibular fracture.  IMPRESSION: Fractures of the right zygoma, inferior lateral walls right orbit, right nasal bone, anterior and lateral walls right maxillary sinus extending into floor right maxillary sinus and alveolar ridge. No definite extraocular muscle entrapment is seen. Extensive right orbital pneumatosis.  CT CERVICAL SPINE  Findings: Diffuse disc space narrowing endplate spur formation, narrowing of the AP diameter of the spinal canal and encroaching upon multiple bilateral cervical neural foramina. Prevertebral soft tissues normal thickness. Mild scattered facet  degenerative changes greatest at right C3-C4. Visualized skull base intact. Scattered carotid arterial calcifications. No definite cervical spine fracture identified.  IMPRESSION: Multilevel degenerative disc disease changes of the cervical spine with scattered AP narrowing of the spinal canal encroachment upon cervical foramina. Minimal facet degenerative changes greatest at right C3-C4. No acute cervical spine fracture or subluxation.   Original Report Authenticated By: Ulyses Southward, M.D.     Dg Chest Port 1 View  08/02/2012  *RADIOLOGY REPORT*  Clinical Data: Syncope, hypertension, smoker.  PORTABLE CHEST - 1 VIEW  Comparison: 01/18/2011.  Findings: Trachea is midline.  Heart size within normal limits. Lungs appear hyperinflated but clear.  No pleural fluid.  IMPRESSION: No acute findings.   Original Report Authenticated By: Leanna Battles, M.D.    Ct Maxillofacial Wo Cm  08/02/2012  *RADIOLOGY REPORT*  Clinical Data:  Syncope, fall, right facial swelling, frontal abrasion, facial pain, weakness, nausea and cough for a couple days  CT HEAD WITHOUT CONTRAST CT MAXILLOFACIAL WITHOUT CONTRAST CT CERVICAL SPINE WITHOUT CONTRAST  Technique:  Multidetector CT imaging of the head, cervical spine, and maxillofacial structures were performed using the standard protocol without intravenous contrast. Multiplanar CT image reconstructions of the cervical spine and maxillofacial structures were also generated.  Comparison:  CT head 01/18/2011  CT HEAD  Findings: Minimal atrophy. Normal ventricular morphology. No midline shift or mass effect. Chronic small vessel ischemic changes of deep cerebral white matter. No intracranial hemorrhage, mass lesion, or evidence of acute infarction. Low attenuation at the pons likely related to beam hardening artifacts from skull base. No extra-axial fluid collections. Right maxillary sinus and orbital findings, reported separately below. Calvaria intact.  IMPRESSION: Small vessel chronic ischemic changes of deep cerebral white matter. No acute intracranial abnormalities.  CT MAXILLOFACIAL  Findings: Extensive right orbital pneumatosis with additional soft tissue gas at right infratemporal fossa. No pneumocephalus. Multiple facial bone fractures including: Right zygoma at near right temporal bone confluence. Inferior wall right orbit with displaced fragment into superior right maxillary sinus. Lateral wall right orbit. Anterior and lateral walls right maxillary sinus extending  into floor of right maxillary sinus and into the alveolar ridge. Right nasal bone, displaced  Sub total opacification of right maxillary sinus.  Remaining paranasal sinuses clear. No left facial fractures are definitely visualized. Nasal septal deviation to the left. Right facial soft tissue gas without mandibular fracture.  IMPRESSION: Fractures of the right zygoma, inferior lateral walls right orbit, right nasal bone, anterior and lateral walls right maxillary sinus extending into floor right maxillary sinus and alveolar ridge. No definite extraocular muscle entrapment is seen. Extensive right orbital pneumatosis.  CT CERVICAL SPINE  Findings: Diffuse disc space narrowing endplate spur formation, narrowing of the AP diameter of the spinal canal and encroaching upon multiple bilateral cervical neural foramina. Prevertebral soft tissues normal thickness. Mild scattered facet degenerative changes greatest at right C3-C4. Visualized skull base intact. Scattered carotid arterial calcifications. No definite cervical spine fracture identified.  IMPRESSION: Multilevel degenerative disc disease changes of the cervical spine with scattered AP narrowing of the spinal canal encroachment upon cervical foramina. Minimal facet degenerative changes greatest at right C3-C4. No acute cervical spine fracture or subluxation.   Original Report Authenticated By: Ulyses Southward, M.D.    ASSESSMENT:  1. Syncope of ? Etiology - diff dx: Orthostatic hypotension, arrhythmia, coronary ischemia, seizure  2. HTN poorly controlled but ran out of meds several months ago  3. Marked LVH on EKG  4. Left orbital fracture with large  hematoma  5. Hypokalemia  6. Acute renal insufficiency - resolved PLAN:  1. Check 2D echo to evaluate LVF  2. Further workup pending results of echo - if echo normal recommend nuclear stress test to rule out ischemia and [employer] heart monitor for 1 month  4. Replete potassium  5. Aggressive BP management 6.  Add Toprol 25mg  daily and increase for BP control as long as HR tolerates it   Quintella Reichert, MD  08/03/2012  8:08 AM

## 2012-08-03 NOTE — Progress Notes (Signed)
PT Cancellation Note  Patient Details Name: JABARIE POP MRN: [id number] DOB: 1960-05-04   Cancelled Treatment:  Evaluation orders received Reason Eval/Treat Not Completed: Patient at procedure or test/unavailable; pt off the floor for testing, Will follow and perform evaluation tomorrow if patient available.   Fabio Asa 08/03/2012, 10:37 AM Charlotte Crumb, PT DPT  [phone]

## 2012-08-04 DIAGNOSIS — I10 Essential (primary) hypertension: Secondary | ICD-10-CM

## 2012-08-04 LAB — BASIC METABOLIC PANEL
CO2: 27 mEq/L (ref 19–32)
Calcium: 9.6 mg/dL (ref 8.4–10.5)
Creatinine, Ser: 1 mg/dL (ref 0.50–1.35)

## 2012-08-04 MED ORDER — BACITRACIN-POLYMYXIN B OP OINT
1.0000 "application " | TOPICAL_OINTMENT | Freq: Two times a day (BID) | OPHTHALMIC | Status: DC
  Administered 2012-08-04 – 2012-08-05 (×2): 1 via OPHTHALMIC
  Filled 2012-08-04: qty 0.4

## 2012-08-04 MED ORDER — BACITRACIN 500 UNIT/GM OP OINT
1.0000 "application " | TOPICAL_OINTMENT | Freq: Two times a day (BID) | OPHTHALMIC | Status: DC
  Filled 2012-08-04: qty 3.5

## 2012-08-04 NOTE — Progress Notes (Addendum)
SUBJECTIVE:  No complaints  OBJECTIVE:   Vitals:   Filed Vitals:   08/03/12 0725 08/03/12 1400 08/03/12 2101 08/04/12 0424  BP: 196/101 200/101 223/105 151/88  Pulse: 74 60 66 72  Temp:  97.9 F (36.6 C) 98.6 F (37 C) 98.7 F (37.1 C)  TempSrc:  Oral Oral Oral  Resp:  19 18 18   Height:      Weight:      SpO2:  99% 100% 98%   I&O's:   Intake/Output Summary (Last 24 hours) at 08/04/12 1205 Last data filed at 08/04/12 0800  Gross per 24 hour  Intake    720 ml  Output    600 ml  Net    120 ml   TELEMETRY: Reviewed telemetry pt in NSR:     PHYSICAL EXAM General: Well developed, well nourished, in no acute distress Lungs:   Clear bilaterally to auscultation and percussion. Heart:   HRRR S1 S2 Pulses are 2+ & equal. Abdomen: Bowel sounds are positive, abdomen soft and non-tender without masses Extremities:   No clubbing, cyanosis or edema.  DP +1 Neuro: Alert and oriented X 3. Psych:  Good affect, responds appropriately   LABS: Basic Metabolic Panel:  Recent Labs  09/81/19 1647 08/03/12 0540 08/03/12 0830 08/04/12 0500  NA  --  134*  --  131*  K  --  3.4*  --  3.6  CL  --  97  --  94*  CO2  --  24  --  27  GLUCOSE  --  92  --  93  BUN  --  15  --  14  CREATININE  --  0.99  --  1.00  CALCIUM  --  9.3  --  9.6  MG 1.3*  --  1.5  --    Liver Function Tests: No results found for this basename: AST, ALT, ALKPHOS, BILITOT, PROT, ALBUMIN,  in the last 72 hours No results found for this basename: LIPASE, AMYLASE,  in the last 72 hours CBC:  Recent Labs  08/02/12 1206 08/02/12 1413 08/03/12 0540  WBC 6.1  --  7.1  NEUTROABS 4.5  --   --   HGB 16.8 21.4* 14.4  HCT 45.4 63.0* 39.9  MCV 91.0  --  89.9  PLT 157  --  146*   Cardiac Enzymes:  Recent Labs  08/02/12 2001 08/03/12 0102 08/03/12 2000  TROPONINI <0.30 <0.30 <0.30   BNP: No components found with this basename: POCBNP,  D-Dimer: No results found for this basename: DDIMER,  in the last 72  hours Hemoglobin A1C: No results found for this basename: HGBA1C,  in the last 72 hours Fasting Lipid Panel: No results found for this basename: CHOL, HDL, LDLCALC, TRIG, CHOLHDL, LDLDIRECT,  in the last 72 hours Thyroid Function Tests: No results found for this basename: TSH, T4TOTAL, FREET3, T3FREE, THYROIDAB,  in the last 72 hours Anemia Panel: No results found for this basename: VITAMINB12, FOLATE, FERRITIN, TIBC, IRON, RETICCTPCT,  in the last 72 hours Coag Panel:   Lab Results  Component Value Date   INR 1.03 10/18/2009   INR 0.98 10/17/2009    RADIOLOGY: Ct Head Wo Contrast  08/02/2012   *RADIOLOGY REPORT*  Clinical Data:  Syncope, fall, right facial swelling, frontal abrasion, facial pain, weakness, nausea and cough for a couple days  CT HEAD WITHOUT CONTRAST CT MAXILLOFACIAL WITHOUT CONTRAST CT CERVICAL SPINE WITHOUT CONTRAST  Technique:  Multidetector CT imaging of the head, cervical spine, and  maxillofacial structures were performed using the standard protocol without intravenous contrast. Multiplanar CT image reconstructions of the cervical spine and maxillofacial structures were also generated.  Comparison:  CT head 01/18/2011  CT HEAD  Findings: Minimal atrophy. Normal ventricular morphology. No midline shift or mass effect. Chronic small vessel ischemic changes of deep cerebral white matter. No intracranial hemorrhage, mass lesion, or evidence of acute infarction. Low attenuation at the pons likely related to beam hardening artifacts from skull base. No extra-axial fluid collections. Right maxillary sinus and orbital findings, reported separately below. Calvaria intact.  IMPRESSION: Small vessel chronic ischemic changes of deep cerebral white matter. No acute intracranial abnormalities.  CT MAXILLOFACIAL  Findings: Extensive right orbital pneumatosis with additional soft tissue gas at right infratemporal fossa. No pneumocephalus. Multiple facial bone fractures including: Right zygoma at  near right temporal bone confluence. Inferior wall right orbit with displaced fragment into superior right maxillary sinus. Lateral wall right orbit. Anterior and lateral walls right maxillary sinus extending into floor of right maxillary sinus and into the alveolar ridge. Right nasal bone, displaced  Sub total opacification of right maxillary sinus.  Remaining paranasal sinuses clear. No left facial fractures are definitely visualized. Nasal septal deviation to the left. Right facial soft tissue gas without mandibular fracture.  IMPRESSION: Fractures of the right zygoma, inferior lateral walls right orbit, right nasal bone, anterior and lateral walls right maxillary sinus extending into floor right maxillary sinus and alveolar ridge. No definite extraocular muscle entrapment is seen. Extensive right orbital pneumatosis.  CT CERVICAL SPINE  Findings: Diffuse disc space narrowing endplate spur formation, narrowing of the AP diameter of the spinal canal and encroaching upon multiple bilateral cervical neural foramina. Prevertebral soft tissues normal thickness. Mild scattered facet degenerative changes greatest at right C3-C4. Visualized skull base intact. Scattered carotid arterial calcifications. No definite cervical spine fracture identified.  IMPRESSION: Multilevel degenerative disc disease changes of the cervical spine with scattered AP narrowing of the spinal canal encroachment upon cervical foramina. Minimal facet degenerative changes greatest at right C3-C4. No acute cervical spine fracture or subluxation.   Original Report Authenticated By: Ulyses Southward, M.D.   Ct Cervical Spine Wo Contrast  08/02/2012   *RADIOLOGY REPORT*  Clinical Data:  Syncope, fall, right facial swelling, frontal abrasion, facial pain, weakness, nausea and cough for a couple days  CT HEAD WITHOUT CONTRAST CT MAXILLOFACIAL WITHOUT CONTRAST CT CERVICAL SPINE WITHOUT CONTRAST  Technique:  Multidetector CT imaging of the head, cervical  spine, and maxillofacial structures were performed using the standard protocol without intravenous contrast. Multiplanar CT image reconstructions of the cervical spine and maxillofacial structures were also generated.  Comparison:  CT head 01/18/2011  CT HEAD  Findings: Minimal atrophy. Normal ventricular morphology. No midline shift or mass effect. Chronic small vessel ischemic changes of deep cerebral white matter. No intracranial hemorrhage, mass lesion, or evidence of acute infarction. Low attenuation at the pons likely related to beam hardening artifacts from skull base. No extra-axial fluid collections. Right maxillary sinus and orbital findings, reported separately below. Calvaria intact.  IMPRESSION: Small vessel chronic ischemic changes of deep cerebral white matter. No acute intracranial abnormalities.  CT MAXILLOFACIAL  Findings: Extensive right orbital pneumatosis with additional soft tissue gas at right infratemporal fossa. No pneumocephalus. Multiple facial bone fractures including: Right zygoma at near right temporal bone confluence. Inferior wall right orbit with displaced fragment into superior right maxillary sinus. Lateral wall right orbit. Anterior and lateral walls right maxillary sinus extending into floor of right  maxillary sinus and into the alveolar ridge. Right nasal bone, displaced  Sub total opacification of right maxillary sinus.  Remaining paranasal sinuses clear. No left facial fractures are definitely visualized. Nasal septal deviation to the left. Right facial soft tissue gas without mandibular fracture.  IMPRESSION: Fractures of the right zygoma, inferior lateral walls right orbit, right nasal bone, anterior and lateral walls right maxillary sinus extending into floor right maxillary sinus and alveolar ridge. No definite extraocular muscle entrapment is seen. Extensive right orbital pneumatosis.  CT CERVICAL SPINE  Findings: Diffuse disc space narrowing endplate spur formation,  narrowing of the AP diameter of the spinal canal and encroaching upon multiple bilateral cervical neural foramina. Prevertebral soft tissues normal thickness. Mild scattered facet degenerative changes greatest at right C3-C4. Visualized skull base intact. Scattered carotid arterial calcifications. No definite cervical spine fracture identified.  IMPRESSION: Multilevel degenerative disc disease changes of the cervical spine with scattered AP narrowing of the spinal canal encroachment upon cervical foramina. Minimal facet degenerative changes greatest at right C3-C4. No acute cervical spine fracture or subluxation.   Original Report Authenticated By: Ulyses Southward, M.D.   Mr Brain Wo Contrast  08/03/2012   *RADIOLOGY REPORT*  Clinical Data: Syncope.  Question seizures?  Hypertension.  Alcohol abuse.  Right orbital fracture and nasal bone fracture from fall.  MRI HEAD WITHOUT CONTRAST  Technique:  Multiplanar, multiecho pulse sequences of the brain and surrounding structures were obtained according to standard protocol without intravenous contrast.  Comparison: 07/22/2012 CT.  03/27/2011 MR.  Findings: Motion degraded exam.  No acute infarct.  Right orbital fracture with soft tissue swelling and opacification right maxillary sinus.  Please see CT report. Hemorrhage extending inferior to the right inferior rectus muscle through the fracture site suspected.  No intracranial hemorrhage.  Global atrophy without hydrocephalus.  Prominent nonspecific white matter type changes may reflect result of small vessel disease in this hypertensive patient.  Findings have changed minimally since prior exam.  No intracranial mass lesion detected on this unenhanced exam.  Major intracranial vascular structures are patent.  No findings of mesial temporal sclerosis.  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  Partially empty sella once again noted.  IMPRESSION: Motion degraded exam.  No acute infarct.   Right orbital fracture with soft tissue swelling and opacification right maxillary sinus.  Please see CT report. Hemorrhage extending inferior to the right inferior rectus muscle through the fracture site suspected.  No intracranial hemorrhage.  Global atrophy without hydrocephalus.  Prominent nonspecific white matter type changes may reflect result of small vessel disease in this hypertensive patient.  Findings have changed minimally since prior exam.   Original Report Authenticated By: Lacy Duverney, M.D.   Dg Chest Port 1 View  08/02/2012   *RADIOLOGY REPORT*  Clinical Data: Syncope, hypertension, smoker.  PORTABLE CHEST - 1 VIEW  Comparison: 01/18/2011.  Findings: Trachea is midline.  Heart size within normal limits. Lungs appear hyperinflated but clear.  No pleural fluid.  IMPRESSION: No acute findings.   Original Report Authenticated By: Leanna Battles, M.D.   Ct Maxillofacial Wo Cm  08/02/2012   *RADIOLOGY REPORT*  Clinical Data:  Syncope, fall, right facial swelling, frontal abrasion, facial pain, weakness, nausea and cough for a couple days  CT HEAD WITHOUT CONTRAST CT MAXILLOFACIAL WITHOUT CONTRAST CT CERVICAL SPINE WITHOUT CONTRAST  Technique:  Multidetector CT imaging of the head, cervical spine, and maxillofacial structures were performed using the standard protocol without intravenous contrast. Multiplanar CT  image reconstructions of the cervical spine and maxillofacial structures were also generated.  Comparison:  CT head 01/18/2011  CT HEAD  Findings: Minimal atrophy. Normal ventricular morphology. No midline shift or mass effect. Chronic small vessel ischemic changes of deep cerebral white matter. No intracranial hemorrhage, mass lesion, or evidence of acute infarction. Low attenuation at the pons likely related to beam hardening artifacts from skull base. No extra-axial fluid collections. Right maxillary sinus and orbital findings, reported separately below. Calvaria intact.  IMPRESSION: Small  vessel chronic ischemic changes of deep cerebral white matter. No acute intracranial abnormalities.  CT MAXILLOFACIAL  Findings: Extensive right orbital pneumatosis with additional soft tissue gas at right infratemporal fossa. No pneumocephalus. Multiple facial bone fractures including: Right zygoma at near right temporal bone confluence. Inferior wall right orbit with displaced fragment into superior right maxillary sinus. Lateral wall right orbit. Anterior and lateral walls right maxillary sinus extending into floor of right maxillary sinus and into the alveolar ridge. Right nasal bone, displaced  Sub total opacification of right maxillary sinus.  Remaining paranasal sinuses clear. No left facial fractures are definitely visualized. Nasal septal deviation to the left. Right facial soft tissue gas without mandibular fracture.  IMPRESSION: Fractures of the right zygoma, inferior lateral walls right orbit, right nasal bone, anterior and lateral walls right maxillary sinus extending into floor right maxillary sinus and alveolar ridge. No definite extraocular muscle entrapment is seen. Extensive right orbital pneumatosis.  CT CERVICAL SPINE  Findings: Diffuse disc space narrowing endplate spur formation, narrowing of the AP diameter of the spinal canal and encroaching upon multiple bilateral cervical neural foramina. Prevertebral soft tissues normal thickness. Mild scattered facet degenerative changes greatest at right C3-C4. Visualized skull base intact. Scattered carotid arterial calcifications. No definite cervical spine fracture identified.  IMPRESSION: Multilevel degenerative disc disease changes of the cervical spine with scattered AP narrowing of the spinal canal encroachment upon cervical foramina. Minimal facet degenerative changes greatest at right C3-C4. No acute cervical spine fracture or subluxation.   Original Report Authenticated By: Ulyses Southward, M.D.   ASSESSMENT:  1. Syncope of ? Etiology - diff dx:  Orthostatic hypotension, arrhythmia, coronary ischemia, seizure  2. HTN poorly controlled but ran out of meds several months ago - BP improved this am 3. Marked LVH on EKG - 2D echo with mild LVH and normal LVF 4. Left orbital fracture with large hematoma  5. Hypokalemia  6. Acute renal insufficiency - resolved  PLAN:  1.  NPO after midnight 2.  Lexiscan CL in am to rule out ischemia 3.  Outpt Lifew[employer] monitor to rule out arrhythmia    Quintella Reichert, MD  08/04/2012  12:05 PM

## 2012-08-04 NOTE — Evaluation (Signed)
Physical Therapy Evaluation Patient Details Name: Austin Mendez MRN: [id number] DOB: [birth year] Today's Date: 08/04/2012 Time: 4098-1191 PT Time Calculation (min): 25 min  PT Assessment / Plan / Recommendation Clinical Impression  Pt is a 52 y.o. male with facial fracture and visual deficts. Patient demonstrates good overall mobility despite injuries. Pt does report memory loss at time of event and headaches. Spoke with patient at length regarding signs and symptoms to be aware of in event of possible concussive injury secondary to pt complaints and mechanism of injury to face. Pt very appreciative. At this time patient has not acute PT needs. PT will sign off. If patient does present with further headaches and memory deficits in the future, recommend follow up with GP for possible concussive like symptoms.     PT Assessment  Patent does not need any further PT services    Follow Up Recommendations  No PT follow up    Does the patient have the potential to tolerate intense rehabilitation      Barriers to Discharge        Equipment Recommendations  None recommended by PT          Precautions / Restrictions     Pertinent Vitals/Pain 2/10      Mobility  Bed Mobility Bed Mobility: Rolling Left;Left Sidelying to Sit;Supine to Sit;Sitting - Scoot to Edge of Bed Rolling Left: 7: Independent Left Sidelying to Sit: 7: Independent Supine to Sit: 7: Independent Sitting - Scoot to Edge of Bed: 7: Independent Transfers Transfers: Sit to Stand;Stand to Sit Sit to Stand: 7: Independent Stand to Sit: 7: Independent Ambulation/Gait Ambulation/Gait Assistance: 5: Supervision Ambulation Distance (Feet): 600 Feet Assistive device: None Ambulation/Gait Assistance Details: steady, supervision for cues secondary to blurred vision Gait Pattern: Within Functional Limits Gait velocity: WFL Stairs: Yes Stairs Assistance: 4: Min guard Stair Management Technique: One rail Right;Step to  pattern;Forwards Number of Stairs: 12    Exercises Total Joint Exercises Ankle Circles/Pumps: AROM;20 reps     Visit Information  Last PT Received On: 08/04/12 Assistance Needed: +1    Subjective Data  Subjective: I have some blurry vision Patient Stated Goal: to go home with wife   Prior Functioning  Home Living Lives With: Spouse Available Help at Discharge: Family Type of Home: House Home Access: Stairs to enter Secretary/administrator of Steps: 4 Entrance Stairs-Rails: Left Home Layout: One level Bathroom Shower/Tub: Engineer, manufacturing systems: Standard Home Adaptive Equipment: None Prior Function Level of Independence: Independent Able to Take Stairs?: Yes Driving: Yes Vocation: Full time employment Communication Communication: No difficulties Dominant Hand: Right    Cognition  Cognition Arousal/Alertness: Awake/alert Behavior During Therapy: WFL for tasks assessed/performed Overall Cognitive Status: Within Functional Limits for tasks assessed    Extremity/Trunk Assessment Right Upper Extremity Assessment RUE ROM/Strength/Tone: Within functional levels Left Upper Extremity Assessment LUE ROM/Strength/Tone: Within functional levels Right Lower Extremity Assessment RLE ROM/Strength/Tone: WFL for tasks assessed Left Lower Extremity Assessment LLE ROM/Strength/Tone: WFL for tasks assessed   Balance Balance Balance Assessed: Yes High Level Balance High Level Balance Activites: Side stepping;Backward walking;Direction changes;Turns;Head turns High Level Balance Comments: steady with balance; some VCs for scanning secondary to visual deficits  End of Session PT - End of Session Equipment Utilized During Treatment: Gait belt Activity Tolerance: Patient tolerated treatment well Patient left: in bed;with call bell/phone within reach;with nursing in room Nurse Communication: Mobility status  GP     Fabio Asa 08/04/2012, 11:47 AM Charlotte Crumb, PT  DPT  [phone]   

## 2012-08-04 NOTE — Progress Notes (Signed)
Physical Therapy Discharge Patient Details Name: SION REINDERS MRN: [id number] DOB: Dec 06, 1960 Today's Date: 08/04/2012 Time: 4098-1191 PT Time Calculation (min): 25 min  Patient discharged from PT services secondary to goals met and no further PT needs identified.  Please see latest therapy progress note for current level of functioning and progress toward goals.    Progress and discharge plan discussed with patient and/or caregiver: Patient/Caregiver agrees with plan  GP     Fabio Asa 08/04/2012, 11:48 AM

## 2012-08-04 NOTE — Progress Notes (Signed)
[facility] PROGRESS NOTE  Austin Mendez ZOX:[id number] DOB: 1961-03-04 DOA: 08/02/2012 PCP: Billee Cashing, MD  Assessment/Plan:  Syncope  Cardiology following. Echo left ventricular hypertrophy. Myoview tomorrow.  Malignant hypertension  Patient has not taken his blood pressure medications for several weeks. BP of 200s / 100s in ED. Continue with  hydralazine 25 mg tid, Clonidine 0.1 BID added 5/13, metoprolol.  Hold diuretic or ACE inhibitor given acute kidney injury.  -BP better controlled.   Facial fracture  Secondary to fall. Patient has sustained fracture of the right zygoma, inferior lateral walls all right orbit, right nasal bone, anterior and lateral walls of right maxillary sinus extending into floor of right medullary sinus and alveolar data. No findings of extraocular muscle entrapment. 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 ENT (Dr. Emeline Darling) being consulted by ED and he recommended non operative management .  Pain control with when necessary Vicodin. Augmentin twice a day started in the ED.  Acute kidney injury  Avoid nephrotoxins. Hydrated with normal saline after admission. Resolved by 08/03/12. Iv fluids stopped 5/13. Cr peak to 1.8. Cr has decrease to 1. 0.  Hypokalemia  Resolved.  Alcohol abuse  He reports cutting down on his alcohol. Has not had a drink in 3 days. Informed drinking 2-3 times a week and binge drinking over the weekend. Check alcohol level. Will place on CIWA. No tremors.      Code Status: full  Family Communication: patient  Disposition Plan: hme   Consultants:  Cardio - [facility]   HPI/Subjective: He does relates mild blood drainage from right eye. No chest pain or dyspnea.   Objective: Filed Vitals:   08/03/12 0725 08/03/12 1400 08/03/12 2101 08/04/12 0424  BP: 196/101 200/101 223/105 151/88  Pulse: 74 60 66 72  Temp:  97.9 F (36.6 C) 98.6 F (37 C) 98.7 F (37.1 C)  TempSrc:  Oral Oral Oral  Resp:  19 18 18    Height:      Weight:      SpO2:  99% 100% 98%   Patient Vitals for the past 24 hrs:  BP Temp Temp src Pulse Resp SpO2  08/04/12 0424 151/88 mmHg 98.7 F (37.1 C) Oral 72 18 98 %  08/03/12 2101 223/105 mmHg 98.6 F (37 C) Oral 66 18 100 %  08/03/12 1400 200/101 mmHg 97.9 F (36.6 C) Oral 60 19 99 %     Intake/Output Summary (Last 24 hours) at 08/04/12 1339 Last data filed at 08/04/12 0800  Gross per 24 hour  Intake    480 ml  Output    400 ml  Net     80 ml   Filed Weights   08/02/12 1747  Weight: 64 kg (141 lb 1.5 oz)    Exam:   General:  axox3  Cardiovascular: rrr  Respiratory: ctab   Abdomen: sof,nt   Musculoskeletal: ecchymosis of the right eye   Data Reviewed: Basic Metabolic Panel:  Recent Labs Lab 08/02/12 1413 08/02/12 1647 08/03/12 0540 08/03/12 0830 08/04/12 0500  NA 134*  --  134*  --  131*  K 3.2*  --  3.4*  --  3.6  CL 93*  --  97  --  94*  CO2  --   --  24  --  27  GLUCOSE 92  --  92  --  93  BUN 25*  --  15  --  14  CREATININE 1.80*  --  0.99  --  1.00  CALCIUM  --   --  9.3  --  9.6  MG  --  1.3*  --  1.5  --    Liver Function Tests: No results found for this basename: AST, ALT, ALKPHOS, BILITOT, PROT, ALBUMIN,  in the last 168 hours No results found for this basename: LIPASE, AMYLASE,  in the last 168 hours No results found for this basename: AMMONIA,  in the last 168 hours CBC:  Recent Labs Lab 08/02/12 1206 08/02/12 1413 08/03/12 0540  WBC 6.1  --  7.1  NEUTROABS 4.5  --   --   HGB 16.8 21.4* 14.4  HCT 45.4 63.0* 39.9  MCV 91.0  --  89.9  PLT 157  --  146*   Cardiac Enzymes:  Recent Labs Lab 08/02/12 2001 08/03/12 0102 08/03/12 2000  TROPONINI <0.30 <0.30 <0.30   BNP (last 3 results) No results found for this basename: PROBNP,  in the last 8760 hours CBG: No results found for this basename: GLUCAP,  in the last 168 hours  No results found for this or any previous visit (from the past 240 hour(s)).    Studies: Mr Brain Wo Contrast  08/03/2012   *RADIOLOGY REPORT*  Clinical Data: Syncope.  Question seizures?  Hypertension.  Alcohol abuse.  Right orbital fracture and nasal bone fracture from fall.  MRI HEAD WITHOUT CONTRAST  Technique:  Multiplanar, multiecho pulse sequences of the brain and surrounding structures were obtained according to standard protocol without intravenous contrast.  Comparison: 07/22/2012 CT.  03/27/2011 MR.  Findings: Motion degraded exam.  No acute infarct.  Right orbital fracture with soft tissue swelling and opacification right maxillary sinus.  Please see CT report. Hemorrhage extending inferior to the right inferior rectus muscle through the fracture site suspected.  No intracranial hemorrhage.  Global atrophy without hydrocephalus.  Prominent nonspecific white matter type changes may reflect result of small vessel disease in this hypertensive patient.  Findings have changed minimally since prior exam.  No intracranial mass lesion detected on this unenhanced exam.  Major intracranial vascular structures are patent.  No findings of mesial temporal sclerosis.  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  Partially empty sella once again noted.  IMPRESSION: Motion degraded exam.  No acute infarct.  Right orbital fracture with soft tissue swelling and opacification right maxillary sinus.  Please see CT report. Hemorrhage extending inferior to the right inferior rectus muscle through the fracture site suspected.  No intracranial hemorrhage.  Global atrophy without hydrocephalus.  Prominent nonspecific white matter type changes may reflect result of small vessel disease in this hypertensive patient.  Findings have changed minimally since prior exam.   Original Report Authenticated By: Lacy Duverney, M.D.    Scheduled Meds: . amoxicillin-clavulanate  1 tablet Oral BID  . aspirin EC  81 mg Oral Daily  . cloNIDine  0.1 mg Oral BID  . enoxaparin  (LOVENOX) injection  40 mg Subcutaneous Q24H  . folic acid  1 mg Oral Daily  . hydrALAZINE  25 mg Oral Q8H  . metoprolol succinate  25 mg Oral Daily  . multivitamin with minerals  1 tablet Oral Daily  . sodium chloride  3 mL Intravenous Q12H  . thiamine  100 mg Oral Daily   Or  . thiamine  100 mg Intravenous Daily   Continuous Infusions:    Principal Problem:   Syncope Active Problems:   ALCOHOL ABUSE   TOBACCO ABUSE   Malignant hypertension   Syncope and collapse   Facial fracture due  to fall   Hypokalemia   Acute kidney injury      REGALADO,BELKYS  [facility] Pager [phone]. If 7PM-7AM, please contact night-coverage at [email or web address], password [facility] 08/04/2012, 1:39 PM  LOS: 2 days

## 2012-08-04 NOTE — Procedures (Signed)
HISTORY:  A 52 year old male with syncope, evaluate to rule out seizure.  MEDICATIONS:  Ativan, Toprol, Augmentin, Catapres, Norco, K-Dur, Zofran, lisinopril.  CONDITIONS OF RECORDING:  This is a 16-channel EEG carried out with the patient in the awake, drowsy, and asleep states.  DESCRIPTION:  During wakefulness, artifact is present but the waking background activity consists of a low-voltage, symmetrical, fairly well- organized 10 hertz alpha activity seen from the parieto-occipital and posterotemporal regions.  Low-voltage, fast activity, poorly organized is seen anteriorly, and at times, superimposed on more posterior rhythms.  A mixture of theta and alpha was seen from the central and temporal regions.  The patient drowses with slowing to irregular low- voltage theta and beta activity.  The patient goes into a light sleep with symmetrical sleep spindles.  Vertex was a sharp activity and irregular slow activity.  Hyperventilation and intermittent photic stimulation were not performed.  IMPRESSION:  This is a normal EEG.          ______________________________ Thana Farr, MD    [id number] D:  08/03/2012 19:47:51  T:  08/04/2012 19:09:15  Job #:  [id number]

## 2012-08-05 MED ORDER — HYDRALAZINE HCL 25 MG PO TABS: 25.0000 mg | ORAL_TABLET | Freq: Three times a day (TID) | ORAL | Status: DC

## 2012-08-05 MED ORDER — AMOXICILLIN-POT CLAVULANATE 875-125 MG PO TABS: 1.0000 | ORAL_TABLET | Freq: Two times a day (BID) | ORAL | Status: DC

## 2012-08-05 MED ORDER — CLONIDINE HCL 0.1 MG PO TABS: 0.1000 mg | ORAL_TABLET | Freq: Two times a day (BID) | ORAL | Status: DC

## 2012-08-05 MED ORDER — METOPROLOL SUCCINATE ER 25 MG PO TB24: 25.0000 mg | ORAL_TABLET | Freq: Every day | ORAL | Status: DC

## 2012-08-05 MED ORDER — HYDROCODONE-ACETAMINOPHEN 5-325 MG PO TABS: 1.0000 | ORAL_TABLET | Freq: Three times a day (TID) | ORAL | Status: DC | PRN

## 2012-08-05 MED ORDER — BACITRACIN-POLYMYXIN B OP OINT: 1.0000 "application " | TOPICAL_OINTMENT | Freq: Two times a day (BID) | OPHTHALMIC | Status: DC

## 2012-08-05 NOTE — Evaluation (Signed)
Physical Therapy Evaluation Patient Details Name: Austin Mendez MRN: [id number] DOB: [birth year] Today's Date: 08/05/2012 Time: 4098-1191 PT Time Calculation (min): 12 min  PT Assessment / Plan / Recommendation Clinical Impression  Pt c/o blurry double vision that is improved when right eye is covered.  Discussed with RN about the possiblity that vision problem is from due the edematous, ecchymotic eye area due to fracture.  Pt has not balance or gait deficit.  Agree with follow up OT to address vision problem    PT Assessment  Patent does not need any further PT services    Follow Up Recommendations  No PT follow up    Does the patient have the potential to tolerate intense rehabilitation      Barriers to Discharge        Equipment Recommendations  None recommended by PT    Recommendations for Other Services     Frequency      Precautions / Restrictions     Pertinent Vitals/Pain No pain, just c/o vision problems      Mobility  Bed Mobility Bed Mobility: Rolling Left;Left Sidelying to Sit;Supine to Sit;Sitting - Scoot to Edge of Bed Rolling Left: 7: Independent Left Sidelying to Sit: 7: Independent Supine to Sit: 7: Independent Sitting - Scoot to Edge of Bed: 7: Independent Transfers Transfers: Sit to Stand;Stand to Sit Sit to Stand: 7: Independent Stand to Sit: 7: Independent Ambulation/Gait Ambulation/Gait Assistance: 7: Independent Assistive device: None Gait Pattern: Within Functional Limits Gait velocity: WFL Stairs: Yes Stairs Assistance: 4: Min guard Stair Management Technique: One rail Right;Step to pattern;Forwards Number of Stairs: 3    Exercises     PT Diagnosis:    PT Problem List:   PT Treatment Interventions:     PT Goals    Visit Information  Last PT Received On: 08/05/12 Assistance Needed: +1    Subjective Data  Subjective: "I see two of them"  "Its Blurry" Patient Stated Goal: to go home with wife   Prior Functioning  Home  Living Lives With: Spouse Available Help at Discharge: Family Type of Home: House Home Access: Stairs to enter Secretary/administrator of Steps: 4 Entrance Stairs-Rails: Left Home Layout: One level Bathroom Shower/Tub: Engineer, manufacturing systems: Standard Home Adaptive Equipment: None Prior Function Level of Independence: Independent Able to Take Stairs?: Yes Driving: Yes Vocation: Full time employment Communication Communication: Other (comment) (slightly slurred speech during evaluation.) Dominant Hand: Right    Cognition  Cognition Arousal/Alertness: Awake/alert Behavior During Therapy: WFL for tasks assessed/performed Overall Cognitive Status: Within Functional Limits for tasks assessed    Extremity/Trunk Assessment Right Upper Extremity Assessment RUE ROM/Strength/Tone: Within functional levels Left Upper Extremity Assessment LUE ROM/Strength/Tone: Within functional levels Right Lower Extremity Assessment RLE ROM/Strength/Tone: WFL for tasks assessed Left Lower Extremity Assessment LLE ROM/Strength/Tone: [facility] for tasks assessed   Balance Standardized Balance Assessment Standardized Balance Assessment: Dynamic Gait Index Dynamic Gait Index Level Surface: Normal Change in Gait Speed: Normal Gait with Horizontal Head Turns: Normal Gait with Vertical Head Turns: Normal Gait and Pivot Turn: Normal Step Over Obstacle: Normal Step Around Obstacles: Normal Steps: Normal Total Score: 24  End of Session PT - End of Session Activity Tolerance: Patient tolerated treatment well Patient left: in bed;with call bell/phone within reach Nurse Communication: Mobility status  GP     Donnetta Hail 08/05/2012, 3:43 PM

## 2012-08-05 NOTE — Evaluation (Addendum)
Occupational Therapy Evaluation Patient Details Name: Austin Mendez MRN: [id number] DOB: [birth year] Today's Date: 08/05/2012 Time: 4098-1191 OT Time Calculation (min): 25 min  OT Assessment / Plan / Recommendation Clinical Impression  Pt is a 52 yo male who fell during syncopal episode and struck his R eye and head and has the deficits listed below.  Pt does have some symptoms of possibly being post concussive including blurry vision, feeling unsteady, feeling "like he is in a fog, " mild memory deficits adn orientation problems.  Pt would benefit from cont OT to address all these issues so he can have a safe d/c home with wife.    OT Assessment  Patient needs continued OT Services    Follow Up Recommendations  Other (comment);Supervision/Assistance - 24 hour;Outpatient OT (Pt instructed to follow up with GP about concussive symptoms)    Barriers to Discharge None wife available 24/7.  Equipment Recommendations  None recommended by OT    Recommendations for Other Services    Frequency  Min 2X/week    Precautions / Restrictions Precautions Precautions: Fall Precaution Comments: Pt reports dizziness and blurred vision which both can affect balance and increase fall risk. Restrictions Weight Bearing Restrictions: No   Pertinent Vitals/Pain No reports of pain.    ADL  Eating/Feeding: Performed;Independent Where Assessed - Eating/Feeding: Edge of bed Grooming: Performed;Wash/dry hands;Wash/dry face;Teeth care;Supervision/safety Where Assessed - Grooming: Unsupported standing Upper Body Bathing: Simulated;Set up Where Assessed - Upper Body Bathing: Unsupported sitting Lower Body Bathing: Simulated;Set up Where Assessed - Lower Body Bathing: Unsupported sit to stand Upper Body Dressing: Simulated;Set up Where Assessed - Upper Body Dressing: Unsupported sitting Lower Body Dressing: Performed;Set up Where Assessed - Lower Body Dressing: Unsupported sit to stand Toilet  Transfer: Performed;Supervision/safety Toilet Transfer Method: Other (comment) (walked to bathroom.) Toilet Transfer Equipment: Comfort height toilet Toileting - Clothing Manipulation and Hygiene: Performed;Supervision/safety Where Assessed - Toileting Clothing Manipulation and Hygiene: Standing Transfers/Ambulation Related to ADLs: Pt walked in hallway and in room with distant S.  Pt with no loss of balance but did feel unsteady on his feet per report. ADL Comments: Pt did well with basic adls.  Greater issues were found with vision and feeling dizzy.  Pt read chalkboard on wall with min assist.      OT Diagnosis: Cognitive deficits  OT Problem List: Decreased cognition;Impaired vision/perception OT Treatment Interventions: Cognitive remediation/compensation;Self-care/ADL training   OT Goals Acute Rehab OT Goals OT Goal Formulation: With patient Time For Goal Achievement: 08/19/12 Potential to Achieve Goals: Good ADL Goals Additional ADL Goal #1: Pt will answer basic orientation questions with 100 % accuracy to increase general awareness of surroundings. ADL Goal: Additional Goal #1 - Progress: Goal set today Additional ADL Goal #2: Pt will complete 3 basic money transations with 100 % accuracy. ADL Goal: Additional Goal #2 - Progress: Goal set today Miscellaneous OT Goals Miscellaneous OT Goal #1: Pt will read menu with appropriate lighting and order lunch with S. OT Goal: Miscellaneous Goal #1 - Progress: Goal set today  Visit Information  Last OT Received On: 08/05/12 Assistance Needed: +1    Subjective Data  Subjective: "I just feel kind of foggy and dizzy." Patient Stated Goal: to be able to work again.   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 Communication Communication: Other (comment) (slightly slurred speech during evaluation.) Dominant Hand: Right         Vision/Perception Vision - History Baseline Vision: No visual deficits Patient Visual Report: Blurring of vision;Eye fatigue/eye pain/headache;Unable to keep objects in focus Vision - Assessment Eye Alignment:  (unable to assess as R eye swollen almost shut.) Vision Assessment: Vision tested Ocular Range of Motion: Other (comment) (appears intact in L eye.  R eye swollen shut.) Tracking/Visual Pursuits: Able to track stimulus in all quads without difficulty;Other (comment) (unable to test R eye due to swelling) Depth Perception: mildly impaired when reaching for objects in front of him.  R eye swollen shut Perception Perception: Within Functional Limits   Cognition  Cognition Arousal/Alertness: Awake/alert Behavior During Therapy: WFL for tasks assessed/performed Overall Cognitive Status: Impaired/Different from baseline Area of Impairment: Memory;Orientation Orientation Level: Disoriented to;Time Memory: Decreased short-term memory General Comments: Pt had difficulty recalling the date even after cues and answer given.  Pt unable to recall the president of [place].  Pt states this is very strange for him and that usually is memory is fine.    Extremity/Trunk Assessment Right Upper Extremity Assessment RUE ROM/Strength/Tone: Within functional levels RUE Sensation: WFL - Light Touch RUE Coordination: WFL - gross/fine motor Left Upper Extremity Assessment LUE ROM/Strength/Tone: Within functional levels LUE Sensation: WFL - Light Touch LUE Coordination: WFL - gross/fine motor Trunk Assessment Trunk Assessment: Normal     Mobility  Bed Mobility Bed Mobility: Rolling Left;Left Sidelying to Sit;Supine to Sit;Sitting - Scoot to Edge of Bed Rolling Left: 7: Independent Left Sidelying to Sit: 7: Independent Supine to Sit: 7: Independent Sitting - Scoot to Edge of Bed: 7: Independent Transfers Transfers: Sit to Stand;Stand to [employer] to Stand: 7: Independent Stand to Sit: 7: Independent Details for Transfer Assistance: Pt moved in room w/o loss of balance but does report feeling insteady and dizzy.       Exercise     Balance     End of Session OT - End of Session Activity Tolerance: Patient tolerated treatment well Patient left: in bed;with call bell/phone within reach Nurse Communication: Mobility status;Other (comment) (pt with symptoms of being post concussive.)  GO     Hope Budds 08/05/2012, 12:13 PM [phone]

## 2012-08-05 NOTE — Progress Notes (Signed)
Pt very adamant about leaving and states he wants to walk home. Advised pt he should not do this due to his recent concussion/injury- this was not a safe idea. Pt states if he cannot get ride home he does not care- he will walk home. This RN spoke with Case Manager to see if we can arrange for cab ride or bus ticket. While this process was going on the Nurse Tech was in room with pt speaking with pt's family and giving them directions to meet here. While Nurse tech was on the phone, pt got up and left. Nurse tech states she was unable to stop him. This RN did not witness him leaving. Pt was advised before hand this was not a safe idea to walk. Pt had been properly discharge  And IV removed prior to leaving. This RN looked for pt to be able to safely walk him out, however could not find him.  Irine Seal, RN

## 2012-08-05 NOTE — Discharge Summary (Signed)
Physician Discharge Summary  Austin Mendez ZOX:[id number] DOB: 07/05/1960 DOA: 08/02/2012  PCP: Billee Cashing, MD  Admit date: 08/02/2012 Discharge date: 08/05/2012  Time spent: 35 minutes  Recommendations for Outpatient Follow-up:  1. Needs to follow up with Dr Emeline Darling within 1 week. 2. Will arrange ot  3. Needs to follow up with PCP for further titration of medications for HTN, .   Discharge Diagnoses:    Syncope   ALCOHOL ABUSE   TOBACCO ABUSE   Malignant hypertension   Syncope and collapse   Facial fracture due to fall   Hypokalemia   Acute kidney injury   Discharge Condition: Stable.   Diet recommendation: [employer] Healthy  Filed Weights   08/02/12 1747  Weight: 64 kg (141 lb 1.5 oz)    History of present illness:  52 year old male with history of hypertension, alcohol abuse, tobacco abuse who was at work today when he syncopized and landed on the floor on his face. He informs waking up almost 2-3 minutes later and feeling slightly dizzy. He realized that he had smashed his face on the ground. He had some pain over the face but denied any pain or difficulty getting up and ambulating. He feels slightly dizzy prior to the episode but denies any chest pain, palpitations, shortness of breath, headache, bloody vision, nausea, vomiting, abdominal pain, bowel or urinary symptoms. He feels he may have had a urinary incontinence during the event. He denies similar symptoms in the past. Reports last alcohol use to be 3-4 days back. He informs being on blood pressure medication for his hypertension but because of his lack of intravenous he has not been able to see his PCP or refill his medication for almost 3 months. He denies any weakness of his arms or legs.   Hospital Course:  Syncope  Cardiology following. Echo left ventricular hypertrophy. Myoview out patient. Unclear etiology of syncope, differential ? Orthostatic, arrythmia. Patient will be discharge with a Holter  monitor. Malignant hypertension  Patient has not taken his blood pressure medications for several weeks. BP of 200s / 100s in ED. Continue with hydralazine 25 mg tid, Clonidine 0.1 BID added 5/13, metoprolol. Hold diuretic or ACE inhibitor given acute kidney injury.  -BP better controlled.  Facial fracture  Secondary to fall. Patient has sustained fracture of the right zygoma, inferior lateral walls all right orbit, right nasal bone, anterior and lateral walls of right maxillary sinus extending into floor of right medullary sinus and alveolar data. No findings of extraocular muscle entrapment. 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 ENT (Dr. Emeline Darling) being consulted by ED and he recommended non operative management .  Pain control with when necessary Vicodin. Augmentin twice a day started in the ED. Needs to follow up with Dr Emeline Darling. Eye: serosanguineous drainage has decreases. He is mildly confuse, some blurry vision, concern for mild concussion. Will provide information to patient. Patient will be discharge later today if he is feeling well.  Acute kidney injury  Avoid nephrotoxins. Hydrated with normal saline after admission. Resolved by 08/03/12. Iv fluids stopped 5/13. Cr peak to 1.8. Cr has decrease to 1. 0.  Hypokalemia  Resolved.  Alcohol abuse  He reports cutting down on his alcohol. Has not had a drink in 3 days. Informed drinking 2-3 times a week and binge drinking over the weekend. Check alcohol level. Will place on CIWA. No tremors.    Procedures: none Consultations: Cardiology, tracy turner. ENT: Dr Emeline Darling  Discharge Exam: Ceasar Mons Vitals:   08/04/12  1345 08/04/12 1951 08/05/12 0409 08/05/12 0605  BP: 136/80 170/99 160/105 171/91  Pulse: 63 63 61   Temp: 98.5 F (36.9 C) 98.8 F (37.1 C) 98.2 F (36.8 C)   TempSrc: Oral Oral Oral   Resp: 20 18 19    Height:      Weight:      SpO2: 100% 99% 98%     General: No distress Cardiovascular: S 1, S 2 RRR Respiratory:  CTA  Discharge Instructions  Discharge Orders   Future Orders Complete By Expires     Diet - low sodium heart healthy  As directed     Increase activity slowly  As directed         Medication List    STOP taking these medications       lisinopril 40 MG tablet  Commonly known as:  PRINIVIL,ZESTRIL      TAKE these medications       amoxicillin-clavulanate 875-125 MG per tablet  Commonly known as:  AUGMENTIN  Take 1 tablet by mouth 2 (two) times daily.     bacitracin-polymyxin b (ophth) Oint  Commonly known as:  POLYSPORIN  Place 1 application into the right eye 2 (two) times daily.     cloNIDine 0.1 MG tablet  Commonly known as:  CATAPRES  Take 1 tablet (0.1 mg total) by mouth 2 (two) times daily.     hydrALAZINE 25 MG tablet  Commonly known as:  APRESOLINE  Take 1 tablet (25 mg total) by mouth every 8 (eight) hours.     HYDROcodone-acetaminophen 5-325 MG per tablet  Commonly known as:  NORCO/VICODIN  Take 1-2 tablets by mouth every 8 (eight) hours as needed.     metoprolol succinate 25 MG 24 hr tablet  Commonly known as:  TOPROL-XL  Take 1 tablet (25 mg total) by mouth daily.       No Known Allergies     Follow-up Information   Follow up with Billee Cashing, MD In 1 week.   Contact information:   [address] [place] [address] [phone]       Follow up with Melvenia Beam, MD. Call in 1 day. (please call and arrange follow up with Dr Emeline Darling f)    Contact information:   [address] [place] [address] [phone]        The results of significant diagnostics from this hospitalization (including imaging, microbiology, ancillary and laboratory) are listed below for reference.    Significant Diagnostic Studies: Ct Head Wo Contrast  08/02/2012   *RADIOLOGY REPORT*  Clinical Data:  Syncope, fall, right facial swelling, frontal abrasion, facial pain, weakness, nausea and cough for a couple days  CT HEAD WITHOUT  CONTRAST CT MAXILLOFACIAL WITHOUT CONTRAST CT CERVICAL SPINE WITHOUT CONTRAST  Technique:  Multidetector CT imaging of the head, cervical spine, and maxillofacial structures were performed using the standard protocol without intravenous contrast. Multiplanar CT image reconstructions of the cervical spine and maxillofacial structures were also generated.  Comparison:  CT head 01/18/2011  CT HEAD  Findings: Minimal atrophy. Normal ventricular morphology. No midline shift or mass effect. Chronic small vessel ischemic changes of deep cerebral white matter. No intracranial hemorrhage, mass lesion, or evidence of acute infarction. Low attenuation at the pons likely related to beam hardening artifacts from skull base. No extra-axial fluid collections. Right maxillary sinus and orbital findings, reported separately below. Calvaria intact.  IMPRESSION: Small vessel chronic ischemic changes of deep cerebral white matter. No  acute intracranial abnormalities.  CT MAXILLOFACIAL  Findings: Extensive right orbital pneumatosis with additional soft tissue gas at right infratemporal fossa. No pneumocephalus. Multiple facial bone fractures including: Right zygoma at near right temporal bone confluence. Inferior wall right orbit with displaced fragment into superior right maxillary sinus. Lateral wall right orbit. Anterior and lateral walls right maxillary sinus extending into floor of right maxillary sinus and into the alveolar ridge. Right nasal bone, displaced  Sub total opacification of right maxillary sinus.  Remaining paranasal sinuses clear. No left facial fractures are definitely visualized. Nasal septal deviation to the left. Right facial soft tissue gas without mandibular fracture.  IMPRESSION: Fractures of the right zygoma, inferior lateral walls right orbit, right nasal bone, anterior and lateral walls right maxillary sinus extending into floor right maxillary sinus and alveolar ridge. No definite extraocular muscle  entrapment is seen. Extensive right orbital pneumatosis.  CT CERVICAL SPINE  Findings: Diffuse disc space narrowing endplate spur formation, narrowing of the AP diameter of the spinal canal and encroaching upon multiple bilateral cervical neural foramina. Prevertebral soft tissues normal thickness. Mild scattered facet degenerative changes greatest at right C3-C4. Visualized skull base intact. Scattered carotid arterial calcifications. No definite cervical spine fracture identified.  IMPRESSION: Multilevel degenerative disc disease changes of the cervical spine with scattered AP narrowing of the spinal canal encroachment upon cervical foramina. Minimal facet degenerative changes greatest at right C3-C4. No acute cervical spine fracture or subluxation.   Original Report Authenticated By: Ulyses Southward, M.D.   Ct Cervical Spine Wo Contrast  08/02/2012   *RADIOLOGY REPORT*  Clinical Data:  Syncope, fall, right facial swelling, frontal abrasion, facial pain, weakness, nausea and cough for a couple days  CT HEAD WITHOUT CONTRAST CT MAXILLOFACIAL WITHOUT CONTRAST CT CERVICAL SPINE WITHOUT CONTRAST  Technique:  Multidetector CT imaging of the head, cervical spine, and maxillofacial structures were performed using the standard protocol without intravenous contrast. Multiplanar CT image reconstructions of the cervical spine and maxillofacial structures were also generated.  Comparison:  CT head 01/18/2011  CT HEAD  Findings: Minimal atrophy. Normal ventricular morphology. No midline shift or mass effect. Chronic small vessel ischemic changes of deep cerebral white matter. No intracranial hemorrhage, mass lesion, or evidence of acute infarction. Low attenuation at the pons likely related to beam hardening artifacts from skull base. No extra-axial fluid collections. Right maxillary sinus and orbital findings, reported separately below. Calvaria intact.  IMPRESSION: Small vessel chronic ischemic changes of deep cerebral white  matter. No acute intracranial abnormalities.  CT MAXILLOFACIAL  Findings: Extensive right orbital pneumatosis with additional soft tissue gas at right infratemporal fossa. No pneumocephalus. Multiple facial bone fractures including: Right zygoma at near right temporal bone confluence. Inferior wall right orbit with displaced fragment into superior right maxillary sinus. Lateral wall right orbit. Anterior and lateral walls right maxillary sinus extending into floor of right maxillary sinus and into the alveolar ridge. Right nasal bone, displaced  Sub total opacification of right maxillary sinus.  Remaining paranasal sinuses clear. No left facial fractures are definitely visualized. Nasal septal deviation to the left. Right facial soft tissue gas without mandibular fracture.  IMPRESSION: Fractures of the right zygoma, inferior lateral walls right orbit, right nasal bone, anterior and lateral walls right maxillary sinus extending into floor right maxillary sinus and alveolar ridge. No definite extraocular muscle entrapment is seen. Extensive right orbital pneumatosis.  CT CERVICAL SPINE  Findings: Diffuse disc space narrowing endplate spur formation, narrowing of the AP diameter of the spinal canal  and encroaching upon multiple bilateral cervical neural foramina. Prevertebral soft tissues normal thickness. Mild scattered facet degenerative changes greatest at right C3-C4. Visualized skull base intact. Scattered carotid arterial calcifications. No definite cervical spine fracture identified.  IMPRESSION: Multilevel degenerative disc disease changes of the cervical spine with scattered AP narrowing of the spinal canal encroachment upon cervical foramina. Minimal facet degenerative changes greatest at right C3-C4. No acute cervical spine fracture or subluxation.   Original Report Authenticated By: Ulyses Southward, M.D.   Mr Brain Wo Contrast  08/03/2012   *RADIOLOGY REPORT*  Clinical Data: Syncope.  Question seizures?   Hypertension.  Alcohol abuse.  Right orbital fracture and nasal bone fracture from fall.  MRI HEAD WITHOUT CONTRAST  Technique:  Multiplanar, multiecho pulse sequences of the brain and surrounding structures were obtained according to standard protocol without intravenous contrast.  Comparison: 07/22/2012 CT.  03/27/2011 MR.  Findings: Motion degraded exam.  No acute infarct.  Right orbital fracture with soft tissue swelling and opacification right maxillary sinus.  Please see CT report. Hemorrhage extending inferior to the right inferior rectus muscle through the fracture site suspected.  No intracranial hemorrhage.  Global atrophy without hydrocephalus.  Prominent nonspecific white matter type changes may reflect result of small vessel disease in this hypertensive patient.  Findings have changed minimally since prior exam.  No intracranial mass lesion detected on this unenhanced exam.  Major intracranial vascular structures are patent.  No findings of mesial temporal sclerosis.  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  Partially empty sella once again noted.  IMPRESSION: Motion degraded exam.  No acute infarct.  Right orbital fracture with soft tissue swelling and opacification right maxillary sinus.  Please see CT report. Hemorrhage extending inferior to the right inferior rectus muscle through the fracture site suspected.  No intracranial hemorrhage.  Global atrophy without hydrocephalus.  Prominent nonspecific white matter type changes may reflect result of small vessel disease in this hypertensive patient.  Findings have changed minimally since prior exam.   Original Report Authenticated By: Lacy Duverney, M.D.   Dg Chest Port 1 View  08/02/2012   *RADIOLOGY REPORT*  Clinical Data: Syncope, hypertension, smoker.  PORTABLE CHEST - 1 VIEW  Comparison: 01/18/2011.  Findings: Trachea is midline.  Heart size within normal limits. Lungs appear hyperinflated but clear.  No  pleural fluid.  IMPRESSION: No acute findings.   Original Report Authenticated By: Leanna Battles, M.D.   Ct Maxillofacial Wo Cm  08/02/2012   *RADIOLOGY REPORT*  Clinical Data:  Syncope, fall, right facial swelling, frontal abrasion, facial pain, weakness, nausea and cough for a couple days  CT HEAD WITHOUT CONTRAST CT MAXILLOFACIAL WITHOUT CONTRAST CT CERVICAL SPINE WITHOUT CONTRAST  Technique:  Multidetector CT imaging of the head, cervical spine, and maxillofacial structures were performed using the standard protocol without intravenous contrast. Multiplanar CT image reconstructions of the cervical spine and maxillofacial structures were also generated.  Comparison:  CT head 01/18/2011  CT HEAD  Findings: Minimal atrophy. Normal ventricular morphology. No midline shift or mass effect. Chronic small vessel ischemic changes of deep cerebral white matter. No intracranial hemorrhage, mass lesion, or evidence of acute infarction. Low attenuation at the pons likely related to beam hardening artifacts from skull base. No extra-axial fluid collections. Right maxillary sinus and orbital findings, reported separately below. Calvaria intact.  IMPRESSION: Small vessel chronic ischemic changes of deep cerebral white matter. No acute intracranial abnormalities.  CT MAXILLOFACIAL  Findings: Extensive right orbital pneumatosis with  additional soft tissue gas at right infratemporal fossa. No pneumocephalus. Multiple facial bone fractures including: Right zygoma at near right temporal bone confluence. Inferior wall right orbit with displaced fragment into superior right maxillary sinus. Lateral wall right orbit. Anterior and lateral walls right maxillary sinus extending into floor of right maxillary sinus and into the alveolar ridge. Right nasal bone, displaced  Sub total opacification of right maxillary sinus.  Remaining paranasal sinuses clear. No left facial fractures are definitely visualized. Nasal septal deviation to the  left. Right facial soft tissue gas without mandibular fracture.  IMPRESSION: Fractures of the right zygoma, inferior lateral walls right orbit, right nasal bone, anterior and lateral walls right maxillary sinus extending into floor right maxillary sinus and alveolar ridge. No definite extraocular muscle entrapment is seen. Extensive right orbital pneumatosis.  CT CERVICAL SPINE  Findings: Diffuse disc space narrowing endplate spur formation, narrowing of the AP diameter of the spinal canal and encroaching upon multiple bilateral cervical neural foramina. Prevertebral soft tissues normal thickness. Mild scattered facet degenerative changes greatest at right C3-C4. Visualized skull base intact. Scattered carotid arterial calcifications. No definite cervical spine fracture identified.  IMPRESSION: Multilevel degenerative disc disease changes of the cervical spine with scattered AP narrowing of the spinal canal encroachment upon cervical foramina. Minimal facet degenerative changes greatest at right C3-C4. No acute cervical spine fracture or subluxation.   Original Report Authenticated By: Ulyses Southward, M.D.    Microbiology: No results found for this or any previous visit (from the past 240 hour(s)).   Labs: Basic Metabolic Panel:  Recent Labs Lab 08/02/12 1413 08/02/12 1647 08/03/12 0540 08/03/12 0830 08/04/12 0500  NA 134*  --  134*  --  131*  K 3.2*  --  3.4*  --  3.6  CL 93*  --  97  --  94*  CO2  --   --  24  --  27  GLUCOSE 92  --  92  --  93  BUN 25*  --  15  --  14  CREATININE 1.80*  --  0.99  --  1.00  CALCIUM  --   --  9.3  --  9.6  MG  --  1.3*  --  1.5  --    Liver Function Tests: No results found for this basename: AST, ALT, ALKPHOS, BILITOT, PROT, ALBUMIN,  in the last 168 hours No results found for this basename: LIPASE, AMYLASE,  in the last 168 hours No results found for this basename: AMMONIA,  in the last 168 hours CBC:  Recent Labs Lab 08/02/12 1206 08/02/12 1413  08/03/12 0540  WBC 6.1  --  7.1  NEUTROABS 4.5  --   --   HGB 16.8 21.4* 14.4  HCT 45.4 63.0* 39.9  MCV 91.0  --  89.9  PLT 157  --  146*   Cardiac Enzymes:  Recent Labs Lab 08/02/12 2001 08/03/12 0102 08/03/12 2000  TROPONINI <0.30 <0.30 <0.30   BNP: BNP (last 3 results) No results found for this basename: PROBNP,  in the last 8760 hours CBG: No results found for this basename: GLUCAP,  in the last 168 hours     Signed:  REGALADO,BELKYS  [facility] 08/05/2012, 8:43 AM

## 2012-08-05 NOTE — Progress Notes (Signed)
SUBJECTIVE:  No compliants  OBJECTIVE:   Vitals:   Filed Vitals:   08/04/12 1345 08/04/12 1951 08/05/12 0409 08/05/12 0605  BP: 136/80 170/99 160/105 171/91  Pulse: 63 63 61   Temp: 98.5 F (36.9 C) 98.8 F (37.1 C) 98.2 F (36.8 C)   TempSrc: Oral Oral Oral   Resp: 20 18 19    Height:      Weight:      SpO2: 100% 99% 98%    I&O's:   Intake/Output Summary (Last 24 hours) at 08/05/12 0853 Last data filed at 08/04/12 1700  Gross per 24 hour  Intake    480 ml  Output    401 ml  Net     79 ml   TELEMETRY: Reviewed telemetry pt in NSR:     PHYSICAL EXAM General: Well developed, well nourished, in no acute distress Head: Eyes PERRLA, No xanthomas.   Normal cephalic and atramatic  Lungs:   Clear bilaterally to auscultation and percussion. Heart:   HRRR S1 S2 Pulses are 2+ & equal. Abdomen: Bowel sounds are positive, abdomen soft and non-tender without masses Extremities:   No clubbing, cyanosis or edema.  DP +1 Neuro: Alert and oriented X 3. Psych:  Good affect, responds appropriately   LABS: Basic Metabolic Panel:  Recent Labs  40/98/11 1647 08/03/12 0540 08/03/12 0830 08/04/12 0500  NA  --  134*  --  131*  K  --  3.4*  --  3.6  CL  --  97  --  94*  CO2  --  24  --  27  GLUCOSE  --  92  --  93  BUN  --  15  --  14  CREATININE  --  0.99  --  1.00  CALCIUM  --  9.3  --  9.6  MG 1.3*  --  1.5  --    Liver Function Tests: No results found for this basename: AST, ALT, ALKPHOS, BILITOT, PROT, ALBUMIN,  in the last 72 hours No results found for this basename: LIPASE, AMYLASE,  in the last 72 hours CBC:  Recent Labs  08/02/12 1206 08/02/12 1413 08/03/12 0540  WBC 6.1  --  7.1  NEUTROABS 4.5  --   --   HGB 16.8 21.4* 14.4  HCT 45.4 63.0* 39.9  MCV 91.0  --  89.9  PLT 157  --  146*   Cardiac Enzymes:  Recent Labs  08/02/12 2001 08/03/12 0102 08/03/12 2000  TROPONINI <0.30 <0.30 <0.30   Coag Panel:   Lab Results  Component Value Date   INR 1.03  10/18/2009   INR 0.98 10/17/2009    RADIOLOGY: Ct Head Wo Contrast  08/02/2012   *RADIOLOGY REPORT*  Clinical Data:  Syncope, fall, right facial swelling, frontal abrasion, facial pain, weakness, nausea and cough for a couple days  CT HEAD WITHOUT CONTRAST CT MAXILLOFACIAL WITHOUT CONTRAST CT CERVICAL SPINE WITHOUT CONTRAST  Technique:  Multidetector CT imaging of the head, cervical spine, and maxillofacial structures were performed using the standard protocol without intravenous contrast. Multiplanar CT image reconstructions of the cervical spine and maxillofacial structures were also generated.  Comparison:  CT head 01/18/2011  CT HEAD  Findings: Minimal atrophy. Normal ventricular morphology. No midline shift or mass effect. Chronic small vessel ischemic changes of deep cerebral white matter. No intracranial hemorrhage, mass lesion, or evidence of acute infarction. Low attenuation at the pons likely related to beam hardening artifacts from skull base. No extra-axial fluid collections. Right  maxillary sinus and orbital findings, reported separately below. Calvaria intact.  IMPRESSION: Small vessel chronic ischemic changes of deep cerebral white matter. No acute intracranial abnormalities.  CT MAXILLOFACIAL  Findings: Extensive right orbital pneumatosis with additional soft tissue gas at right infratemporal fossa. No pneumocephalus. Multiple facial bone fractures including: Right zygoma at near right temporal bone confluence. Inferior wall right orbit with displaced fragment into superior right maxillary sinus. Lateral wall right orbit. Anterior and lateral walls right maxillary sinus extending into floor of right maxillary sinus and into the alveolar ridge. Right nasal bone, displaced  Sub total opacification of right maxillary sinus.  Remaining paranasal sinuses clear. No left facial fractures are definitely visualized. Nasal septal deviation to the left. Right facial soft tissue gas without mandibular  fracture.  IMPRESSION: Fractures of the right zygoma, inferior lateral walls right orbit, right nasal bone, anterior and lateral walls right maxillary sinus extending into floor right maxillary sinus and alveolar ridge. No definite extraocular muscle entrapment is seen. Extensive right orbital pneumatosis.  CT CERVICAL SPINE  Findings: Diffuse disc space narrowing endplate spur formation, narrowing of the AP diameter of the spinal canal and encroaching upon multiple bilateral cervical neural foramina. Prevertebral soft tissues normal thickness. Mild scattered facet degenerative changes greatest at right C3-C4. Visualized skull base intact. Scattered carotid arterial calcifications. No definite cervical spine fracture identified.  IMPRESSION: Multilevel degenerative disc disease changes of the cervical spine with scattered AP narrowing of the spinal canal encroachment upon cervical foramina. Minimal facet degenerative changes greatest at right C3-C4. No acute cervical spine fracture or subluxation.   Original Report Authenticated By: Ulyses Southward, M.D.   Ct Cervical Spine Wo Contrast  08/02/2012   *RADIOLOGY REPORT*  Clinical Data:  Syncope, fall, right facial swelling, frontal abrasion, facial pain, weakness, nausea and cough for a couple days  CT HEAD WITHOUT CONTRAST CT MAXILLOFACIAL WITHOUT CONTRAST CT CERVICAL SPINE WITHOUT CONTRAST  Technique:  Multidetector CT imaging of the head, cervical spine, and maxillofacial structures were performed using the standard protocol without intravenous contrast. Multiplanar CT image reconstructions of the cervical spine and maxillofacial structures were also generated.  Comparison:  CT head 01/18/2011  CT HEAD  Findings: Minimal atrophy. Normal ventricular morphology. No midline shift or mass effect. Chronic small vessel ischemic changes of deep cerebral white matter. No intracranial hemorrhage, mass lesion, or evidence of acute infarction. Low attenuation at the pons likely  related to beam hardening artifacts from skull base. No extra-axial fluid collections. Right maxillary sinus and orbital findings, reported separately below. Calvaria intact.  IMPRESSION: Small vessel chronic ischemic changes of deep cerebral white matter. No acute intracranial abnormalities.  CT MAXILLOFACIAL  Findings: Extensive right orbital pneumatosis with additional soft tissue gas at right infratemporal fossa. No pneumocephalus. Multiple facial bone fractures including: Right zygoma at near right temporal bone confluence. Inferior wall right orbit with displaced fragment into superior right maxillary sinus. Lateral wall right orbit. Anterior and lateral walls right maxillary sinus extending into floor of right maxillary sinus and into the alveolar ridge. Right nasal bone, displaced  Sub total opacification of right maxillary sinus.  Remaining paranasal sinuses clear. No left facial fractures are definitely visualized. Nasal septal deviation to the left. Right facial soft tissue gas without mandibular fracture.  IMPRESSION: Fractures of the right zygoma, inferior lateral walls right orbit, right nasal bone, anterior and lateral walls right maxillary sinus extending into floor right maxillary sinus and alveolar ridge. No definite extraocular muscle entrapment is seen. Extensive right orbital  pneumatosis.  CT CERVICAL SPINE  Findings: Diffuse disc space narrowing endplate spur formation, narrowing of the AP diameter of the spinal canal and encroaching upon multiple bilateral cervical neural foramina. Prevertebral soft tissues normal thickness. Mild scattered facet degenerative changes greatest at right C3-C4. Visualized skull base intact. Scattered carotid arterial calcifications. No definite cervical spine fracture identified.  IMPRESSION: Multilevel degenerative disc disease changes of the cervical spine with scattered AP narrowing of the spinal canal encroachment upon cervical foramina. Minimal facet  degenerative changes greatest at right C3-C4. No acute cervical spine fracture or subluxation.   Original Report Authenticated By: Ulyses Southward, M.D.   Mr Brain Wo Contrast  08/03/2012   *RADIOLOGY REPORT*  Clinical Data: Syncope.  Question seizures?  Hypertension.  Alcohol abuse.  Right orbital fracture and nasal bone fracture from fall.  MRI HEAD WITHOUT CONTRAST  Technique:  Multiplanar, multiecho pulse sequences of the brain and surrounding structures were obtained according to standard protocol without intravenous contrast.  Comparison: 07/22/2012 CT.  03/27/2011 MR.  Findings: Motion degraded exam.  No acute infarct.  Right orbital fracture with soft tissue swelling and opacification right maxillary sinus.  Please see CT report. Hemorrhage extending inferior to the right inferior rectus muscle through the fracture site suspected.  No intracranial hemorrhage.  Global atrophy without hydrocephalus.  Prominent nonspecific white matter type changes may reflect result of small vessel disease in this hypertensive patient.  Findings have changed minimally since prior exam.  No intracranial mass lesion detected on this unenhanced exam.  Major intracranial vascular structures are patent.  No findings of mesial temporal sclerosis.  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  Partially empty sella once again noted.  IMPRESSION: Motion degraded exam.  No acute infarct.  Right orbital fracture with soft tissue swelling and opacification right maxillary sinus.  Please see CT report. Hemorrhage extending inferior to the right inferior rectus muscle through the fracture site suspected.  No intracranial hemorrhage.  Global atrophy without hydrocephalus.  Prominent nonspecific white matter type changes may reflect result of small vessel disease in this hypertensive patient.  Findings have changed minimally since prior exam.   Original Report Authenticated By: Lacy Duverney, M.D.   Dg Chest  Port 1 View  08/02/2012   *RADIOLOGY REPORT*  Clinical Data: Syncope, hypertension, smoker.  PORTABLE CHEST - 1 VIEW  Comparison: 01/18/2011.  Findings: Trachea is midline.  Heart size within normal limits. Lungs appear hyperinflated but clear.  No pleural fluid.  IMPRESSION: No acute findings.   Original Report Authenticated By: Leanna Battles, M.D.   Ct Maxillofacial Wo Cm  08/02/2012   *RADIOLOGY REPORT*  Clinical Data:  Syncope, fall, right facial swelling, frontal abrasion, facial pain, weakness, nausea and cough for a couple days  CT HEAD WITHOUT CONTRAST CT MAXILLOFACIAL WITHOUT CONTRAST CT CERVICAL SPINE WITHOUT CONTRAST  Technique:  Multidetector CT imaging of the head, cervical spine, and maxillofacial structures were performed using the standard protocol without intravenous contrast. Multiplanar CT image reconstructions of the cervical spine and maxillofacial structures were also generated.  Comparison:  CT head 01/18/2011  CT HEAD  Findings: Minimal atrophy. Normal ventricular morphology. No midline shift or mass effect. Chronic small vessel ischemic changes of deep cerebral white matter. No intracranial hemorrhage, mass lesion, or evidence of acute infarction. Low attenuation at the pons likely related to beam hardening artifacts from skull base. No extra-axial fluid collections. Right maxillary sinus and orbital findings, reported separately below. Calvaria intact.  IMPRESSION: Small  vessel chronic ischemic changes of deep cerebral white matter. No acute intracranial abnormalities.  CT MAXILLOFACIAL  Findings: Extensive right orbital pneumatosis with additional soft tissue gas at right infratemporal fossa. No pneumocephalus. Multiple facial bone fractures including: Right zygoma at near right temporal bone confluence. Inferior wall right orbit with displaced fragment into superior right maxillary sinus. Lateral wall right orbit. Anterior and lateral walls right maxillary sinus extending into floor  of right maxillary sinus and into the alveolar ridge. Right nasal bone, displaced  Sub total opacification of right maxillary sinus.  Remaining paranasal sinuses clear. No left facial fractures are definitely visualized. Nasal septal deviation to the left. Right facial soft tissue gas without mandibular fracture.  IMPRESSION: Fractures of the right zygoma, inferior lateral walls right orbit, right nasal bone, anterior and lateral walls right maxillary sinus extending into floor right maxillary sinus and alveolar ridge. No definite extraocular muscle entrapment is seen. Extensive right orbital pneumatosis.  CT CERVICAL SPINE  Findings: Diffuse disc space narrowing endplate spur formation, narrowing of the AP diameter of the spinal canal and encroaching upon multiple bilateral cervical neural foramina. Prevertebral soft tissues normal thickness. Mild scattered facet degenerative changes greatest at right C3-C4. Visualized skull base intact. Scattered carotid arterial calcifications. No definite cervical spine fracture identified.  IMPRESSION: Multilevel degenerative disc disease changes of the cervical spine with scattered AP narrowing of the spinal canal encroachment upon cervical foramina. Minimal facet degenerative changes greatest at right C3-C4. No acute cervical spine fracture or subluxation.   Original Report Authenticated By: Ulyses Southward, M.D.   ASSESSMENT:  1. Syncope of ? Etiology - diff dx: Orthostatic hypotension, arrhythmia, coronary ischemia, seizure  2. HTN poorly controlled but ran out of meds several months ago - BP improved this am  3. Marked LVH on EKG - 2D echo with mild LVH and normal LVF  4. Left orbital fracture with large hematoma  5. Hypokalemia  6. Acute renal insufficiency - resolved  PLAN:  1. Lexiscan CL cancelled - discussed lexiscan with pharmacy - since it it a vasodilator unknown whether increased blood flow to orbit may worsen edema.  He has not had any chest pain so will  hold off until eye injury improved.  Will plan outpatient Lexiscan CL. 2.  Outpatient [employer] monitor for 4 weeks - my staff will place this today prior to patient discharge. 3.  Patient instructed not to drive     Quintella Reichert, MD  08/05/2012  8:53 AM

## 2012-08-05 NOTE — Progress Notes (Signed)
Discharge instructions discussed with pt along with new medications. Stressed the importance of pt taking BP medications since BP has been very elevated. Also stressed importance of making his follow up appointments. Pt verbalized his understanding of this information. Pt is stable for discharge with family.

## 2012-08-12 ENCOUNTER — Ambulatory Visit: Payer: Medicaid Other | Attending: Internal Medicine | Admitting: Occupational Therapy

## 2012-08-12 DIAGNOSIS — IMO0001 Reserved for inherently not codable concepts without codable children: Secondary | ICD-10-CM | POA: Insufficient documentation

## 2012-08-12 DIAGNOSIS — R4189 Other symptoms and signs involving cognitive functions and awareness: Secondary | ICD-10-CM | POA: Insufficient documentation

## 2012-09-01 ENCOUNTER — Other Ambulatory Visit (HOSPITAL_COMMUNITY): Payer: Self-pay | Admitting: Cardiology

## 2012-09-01 DIAGNOSIS — R55 Syncope and collapse: Secondary | ICD-10-CM

## 2012-09-01 DIAGNOSIS — R0602 Shortness of breath: Secondary | ICD-10-CM

## 2012-09-01 DIAGNOSIS — R42 Dizziness and giddiness: Secondary | ICD-10-CM

## 2012-09-03 ENCOUNTER — Other Ambulatory Visit: Payer: Self-pay

## 2012-09-03 ENCOUNTER — Encounter (HOSPITAL_COMMUNITY)
Admission: RE | Admit: 2012-09-03 | Discharge: 2012-09-03 | Disposition: A | Discharge status: Home / Self Care | Payer: Medicaid Other | Source: Ambulatory Visit | Attending: Cardiology | Admitting: Cardiology

## 2012-09-03 DIAGNOSIS — I10 Essential (primary) hypertension: Secondary | ICD-10-CM

## 2012-09-03 DIAGNOSIS — R42 Dizziness and giddiness: Secondary | ICD-10-CM

## 2012-09-03 DIAGNOSIS — R55 Syncope and collapse: Secondary | ICD-10-CM | POA: Insufficient documentation

## 2012-09-03 DIAGNOSIS — R0602 Shortness of breath: Secondary | ICD-10-CM | POA: Insufficient documentation

## 2012-09-03 MED ORDER — TECHNETIUM TC 99M SESTAMIBI GENERIC - CARDIOLITE
10.0000 | Freq: Once | INTRAVENOUS | Status: AC | PRN
Start: 2012-09-03 — End: 2012-09-03
  Administered 2012-09-03: 10 via INTRAVENOUS

## 2012-09-03 MED ORDER — TECHNETIUM TC 99M SESTAMIBI GENERIC - CARDIOLITE
30.0000 | Freq: Once | INTRAVENOUS | Status: AC | PRN
  Administered 2012-09-03: 30 via INTRAVENOUS

## 2012-09-03 MED ORDER — REGADENOSON 0.4 MG/5ML IV SOLN
INTRAVENOUS | Status: AC
  Filled 2012-09-03: qty 5

## 2012-09-03 MED ORDER — REGADENOSON 0.4 MG/5ML IV SOLN
0.4000 mg | Freq: Once | INTRAVENOUS | Status: AC
  Administered 2012-09-03: 0.4 mg via INTRAVENOUS

## 2013-04-28 ENCOUNTER — Emergency Department (HOSPITAL_COMMUNITY): Payer: Medicaid Other

## 2013-04-28 ENCOUNTER — Emergency Department (HOSPITAL_COMMUNITY)
Admission: EM | Admit: 2013-04-28 | Discharge: 2013-04-28 | Disposition: A | Discharge status: Home / Self Care | Payer: Medicaid Other | Attending: Emergency Medicine | Admitting: Emergency Medicine

## 2013-04-28 ENCOUNTER — Encounter (HOSPITAL_COMMUNITY): Payer: Self-pay | Admitting: Emergency Medicine

## 2013-04-28 DIAGNOSIS — Z87891 Personal history of nicotine dependence: Secondary | ICD-10-CM | POA: Insufficient documentation

## 2013-04-28 DIAGNOSIS — R112 Nausea with vomiting, unspecified: Secondary | ICD-10-CM | POA: Insufficient documentation

## 2013-04-28 DIAGNOSIS — Z79899 Other long term (current) drug therapy: Secondary | ICD-10-CM | POA: Insufficient documentation

## 2013-04-28 DIAGNOSIS — R51 Headache: Secondary | ICD-10-CM | POA: Insufficient documentation

## 2013-04-28 DIAGNOSIS — I10 Essential (primary) hypertension: Secondary | ICD-10-CM | POA: Insufficient documentation

## 2013-04-28 DIAGNOSIS — R42 Dizziness and giddiness: Secondary | ICD-10-CM | POA: Insufficient documentation

## 2013-04-28 LAB — COMPREHENSIVE METABOLIC PANEL
ALBUMIN: 3.3 g/dL — AB (ref 3.5–5.2)
ALK PHOS: 81 U/L (ref 39–117)
ALT: 125 U/L — AB (ref 0–53)
AST: 119 U/L — ABNORMAL HIGH (ref 0–37)
BUN: 8 mg/dL (ref 6–23)
CO2: 27 mEq/L (ref 19–32)
Calcium: 8.8 mg/dL (ref 8.4–10.5)
Chloride: 96 mEq/L (ref 96–112)
Creatinine, Ser: 0.86 mg/dL (ref 0.50–1.35)
GFR calc Af Amer: >90 mL/min (ref >90)
GFR calc non Af Amer: >90 mL/min (ref >90)
Glucose, Bld: 133 mg/dL — ABNORMAL HIGH (ref 70–99)
POTASSIUM: 3.5 meq/L — AB (ref 3.7–5.3)
SODIUM: 137 meq/L (ref 137–147)
TOTAL PROTEIN: 8.8 g/dL — AB (ref 6.0–8.3)
Total Bilirubin: 0.8 mg/dL (ref 0.3–1.2)

## 2013-04-28 LAB — LIPASE, BLOOD: Lipase: 24 U/L (ref 11–59)

## 2013-04-28 LAB — CBC WITH DIFFERENTIAL/PLATELET
BASOS ABS: 0 10*3/uL (ref 0.0–0.1)
Basophils Relative: 1 % (ref 0–1)
EOS PCT: 1 % (ref 0–5)
Eosinophils Absolute: 0 10*3/uL (ref 0.0–0.7)
HEMATOCRIT: 40.3 % (ref 39.0–52.0)
Hemoglobin: 14.6 g/dL (ref 13.0–17.0)
LYMPHS PCT: 37 % (ref 12–46)
Lymphs Abs: 1.4 10*3/uL (ref 0.7–4.0)
MCH: 33.3 pg (ref 26.0–34.0)
MCHC: 36.2 g/dL — AB (ref 30.0–36.0)
MCV: 91.8 fL (ref 78.0–100.0)
MONO ABS: 0.3 10*3/uL (ref 0.1–1.0)
MONOS PCT: 8 % (ref 3–12)
Neutro Abs: 2 10*3/uL (ref 1.7–7.7)
Neutrophils Relative %: 54 % (ref 43–77)
Platelets: 188 10*3/uL (ref 150–400)
RBC: 4.39 MIL/uL (ref 4.22–5.81)
RDW: 14.1 % (ref 11.5–15.5)
WBC: 3.7 10*3/uL — AB (ref 4.0–10.5)

## 2013-04-28 LAB — URINALYSIS, ROUTINE W REFLEX MICROSCOPIC
BILIRUBIN URINE: NEGATIVE
GLUCOSE, UA: NEGATIVE mg/dL
HGB URINE DIPSTICK: NEGATIVE
KETONES UR: NEGATIVE mg/dL
Leukocytes, UA: NEGATIVE
NITRITE: NEGATIVE
PH: 7 (ref 5.0–8.0)
Protein, ur: 100 mg/dL — AB
Specific Gravity, Urine: 1.013 (ref 1.005–1.030)
Urobilinogen, UA: 2 mg/dL — ABNORMAL HIGH (ref 0.0–1.0)

## 2013-04-28 LAB — URINE MICROSCOPIC-ADD ON

## 2013-04-28 MED ORDER — CLONIDINE HCL 0.1 MG PO TABS: 0.1000 mg | ORAL_TABLET | Freq: Two times a day (BID) | ORAL | Status: DC

## 2013-04-28 MED ORDER — CLONIDINE HCL 0.1 MG PO TABS
0.1000 mg | ORAL_TABLET | Freq: Once | ORAL | Status: AC
  Administered 2013-04-28: 0.1 mg via ORAL
  Filled 2013-04-28: qty 1

## 2013-04-28 MED ORDER — HYDRALAZINE HCL 25 MG PO TABS
25.0000 mg | ORAL_TABLET | Freq: Once | ORAL | Status: AC
  Administered 2013-04-28: 25 mg via ORAL
  Filled 2013-04-28: qty 1

## 2013-04-28 MED ORDER — HYDRALAZINE HCL 25 MG PO TABS: 25.0000 mg | ORAL_TABLET | Freq: Three times a day (TID) | ORAL | Status: DC

## 2013-04-28 NOTE — ED Provider Notes (Signed)
CSN: [id number]     Arrival date & time 04/28/13  1152 History   First MD Initiated Contact with Patient 04/28/13 1456     Chief Complaint  Patient presents with  . Emesis  . Hypertension   (Consider location/radiation/quality/duration/timing/severity/associated sxs/prior Treatment) HPI  This 53 year old male with history of hypertension and tobacco abuse who presents with nausea and "swimmy headedness." Patient reports that he feels that his blood pressure is high. He's been out of his blood pressure medications for 3 weeks.  Patient states that he has had red spinning dizziness and feels that the dizziness gets works when his pressure is elevated. He denies any shortness of breath or chest pain. He has had nausea without vomiting and generalized fatigue. He denies any weakness, numbness, or tingling. He had a headache yesterday that resolved with aspirin use. He denies any headache today. He did take one of his wife's blood pressure pills this morning.  Past Medical History  Diagnosis Date  . Hypertension    History reviewed. No pertinent past surgical history. History reviewed. No pertinent family history. History  Substance Use Topics  . Smoking status: Former Games developermoker  . Smokeless tobacco: Not on file  . Alcohol Use: Yes     Comment: Smells of ETOH  St's just stopped drinking    Review of Systems  Constitutional: Negative for fever.  Respiratory: Negative.  Negative for chest tightness and shortness of breath.   Cardiovascular: Negative.  Negative for chest pain.  Gastrointestinal: Positive for nausea and vomiting. Negative for abdominal pain.  Genitourinary: Negative.  Negative for dysuria.  Musculoskeletal: Negative for back pain.  Skin: Negative for rash.  Neurological: Positive for dizziness, light-headedness and headaches. Negative for weakness and numbness.  All other systems reviewed and are negative.    Allergies  Review of patient's allergies indicates no known  allergies.  Home Medications   Current Outpatient Rx  Name  Route  Sig  Dispense  Refill  . metoprolol succinate (TOPROL-XL) 25 MG 24 hr tablet   Oral   Take 1 tablet (25 mg total) by mouth daily.   30 tablet   0   . cloNIDine (CATAPRES) 0.1 MG tablet   Oral   Take 1 tablet (0.1 mg total) by mouth 2 (two) times daily.   15 tablet   0   . hydrALAZINE (APRESOLINE) 25 MG tablet   Oral   Take 1 tablet (25 mg total) by mouth 3 (three) times daily.   45 tablet   0    BP 154/95  Pulse 65  Temp(Src) 97 F (36.1 C) (Oral)  Resp 20  SpO2 100% Physical Exam  Nursing note and vitals reviewed. Constitutional: He is oriented to person, place, and time. No distress.  HENT:  Head: Normocephalic and atraumatic.  Mucous membranes dry  Eyes: Pupils are equal, round, and reactive to light.  Neck: Neck supple. No JVD present.  Cardiovascular: Normal rate, regular rhythm and normal heart sounds.   No murmur heard. Pulmonary/Chest: Effort normal and breath sounds normal. No respiratory distress. He has no wheezes.  Abdominal: Soft. Bowel sounds are normal. There is no tenderness. There is no rebound.  Musculoskeletal: He exhibits no edema.  Lymphadenopathy:    He has no cervical adenopathy.  Neurological: He is alert and oriented to person, place, and time. No cranial nerve deficit.  Coordination intact to finger-nose-finger, 5 out of 5 strength in all 4 extremities  Skin: Skin is warm and dry.  Psychiatric: He  has a normal mood and affect.    ED Course  Procedures (including critical care time) Labs Review Labs Reviewed  COMPREHENSIVE METABOLIC PANEL - Abnormal; Notable for the following:    Potassium 3.5 (*)    Glucose, Bld 133 (*)    Total Protein 8.8 (*)    Albumin 3.3 (*)    AST 119 (*)    ALT 125 (*)    All other components within normal limits  CBC WITH DIFFERENTIAL - Abnormal; Notable for the following:    WBC 3.7 (*)    MCHC 36.2 (*)    All other components within  normal limits  URINALYSIS, ROUTINE W REFLEX MICROSCOPIC - Abnormal; Notable for the following:    Protein, ur 100 (*)    Urobilinogen, UA 2.0 (*)    All other components within normal limits  URINE MICROSCOPIC-ADD ON - Abnormal; Notable for the following:    Casts HYALINE CASTS (*)    All other components within normal limits  LIPASE, BLOOD   Imaging Review Ct Head Wo Contrast  04/28/2013   CLINICAL DATA:  Weakness and dizziness and generalized pain with nausea and vomiting  EXAM: CT HEAD WITHOUT CONTRAST  TECHNIQUE: Contiguous axial images were obtained from the base of the skull through the vertex without intravenous contrast.  COMPARISON:  MR HEAD W/O CM dated 08/02/2012  FINDINGS: There is mild diffuse cerebral atrophy. There is compensatory ventriculomegaly. There is decreased density in the deep white matter of both cerebral hemispheres consistent with chronic small vessel ischemic type change. The cerebellum and brainstem exhibit no acute abnormalities. There are no abnormal intracranial calcifications. There is no evidence of an acute ischemic or hemorrhagic infarction.  There is mucoperiosteal thickening within the maxillary sinuses bilaterally as well with and ethmoid sinus cells on the right. No air-fluid levels are demonstrated. There is no evidence of an acute skull fracture.  IMPRESSION: 1. There is mild stable diffuse cerebral and cerebellar atrophy. There is no evidence of an acute ischemic or hemorrhagic event. 2. There is inflammatory change of the maxillary sinuses bilaterally as well as several right ethmoid sinus cells.   Electronically Signed   By: David  [place]   On: 04/28/2013 16:37    EKG Interpretation   None       MDM   1. Hypertension    Patient presents with hypertension and headache. Noted to be hypertensive on exam. He is nonfocal. CT scan of the head is negative. Patient has been office his blood pressure medications which included metoprolol, hydralazine, and  clonidine for several weeks. He endorses that he currently has prescriptions that are being filled. Patient was given hydralazine and clonidine. Blood pressures improved and patient is asymptomatic. I suspect patient's symptoms are likely secondary to blood pressure. Patient would like to be discharged home. I have given the patient prescriptions for a short course of clonidine and hydralazine. I have encouraged him to followup with his primary care physician and take his blood pressure medications as prescribed. If his primary physician is or to call him and prescriptions, he does not need to get the clonidine and hydralazine filled.  After history, exam, and medical workup I feel the patient has been appropriately medically screened and is safe for discharge home. Pertinent diagnoses were discussed with the patient. Patient was given return precautions.     Shon Baton, MD 04/29/13 [phone]

## 2013-04-28 NOTE — ED Notes (Signed)
Pt now reports that he is here for HTN. States he has been out of RX for appx 2 weeks and now starting to have weakness, HA. AO x4. Neuro intact. Pt also reports emesis, but requesting something to drink.

## 2013-04-28 NOTE — ED Notes (Signed)
Pt arrived by ptar. Having n/v since yesterday with generalized fatigue today, denies diarrhea. Reports left knee pain, no injury. No acute distress noted.

## 2013-04-28 NOTE — Discharge Instructions (Signed)
Arterial Hypertension °Arterial hypertension (high blood pressure) is a condition of elevated pressure in your blood vessels. Hypertension over a long period of time is a risk factor for strokes, heart attacks, and heart failure. It is also the leading cause of kidney (renal) failure.  °CAUSES  °· In Adults -- Over 90% of all hypertension has no known cause. This is called essential or primary hypertension. In the other 10% of people with hypertension, the increase in blood pressure is caused by another disorder. This is called secondary hypertension. Important causes of secondary hypertension are: °· Heavy alcohol use. °· Obstructive sleep apnea. °· Hyperaldosterosim (Conn's syndrome). °· Steroid use. °· Chronic kidney failure. °· Hyperparathyroidism. °· Medications. °· Renal artery stenosis. °· Pheochromocytoma. °· Cushing's disease. °· Coarctation of the aorta. °· Scleroderma renal crisis. °· Licorice (in excessive amounts). °· Drugs (cocaine, methamphetamine). °Your caregiver can explain any items above that apply to you. °· In Children -- Secondary hypertension is more common and should always be considered. °· Pregnancy -- Few women of childbearing age have high blood pressure. However, up to 10% of them develop hypertension of pregnancy. Generally, this will not harm the woman. It may be a sign of 3 complications of pregnancy: preeclampsia, HELLP syndrome, and eclampsia. Follow up and control with medication is necessary. °SYMPTOMS  °· This condition normally does not produce any noticeable symptoms. It is usually found during a routine exam. °· Malignant hypertension is a late problem of high blood pressure. It may have the following symptoms: °· Headaches. °· Blurred vision. °· End-organ damage (this means your kidneys, heart, lungs, and other organs are being damaged). °· Stressful situations can increase the blood pressure. If a person with normal blood pressure has their blood pressure go up while being  seen by their caregiver, this is often termed "white coat hypertension." Its importance is not known. It may be related with eventually developing hypertension or complications of hypertension. °· Hypertension is often confused with mental tension, stress, and anxiety. °DIAGNOSIS  °The diagnosis is made by 3 separate blood pressure measurements. They are taken at least 1 week apart from each other. If there is organ damage from hypertension, the diagnosis may be made without repeat measurements. °Hypertension is usually identified by having blood pressure readings: °· Above 140/90 mmHg measured in both arms, at 3 separate times, over a couple weeks. °· Over 130/80 mmHg should be considered a risk factor and may require treatment in patients with diabetes. °Blood pressure readings over 120/80 mmHg are called "pre-hypertension" even in non-diabetic patients. °To get a true blood pressure measurement, use the following guidelines. Be aware of the factors that can alter blood pressure readings. °· Take measurements at least 1 hour after caffeine. °· Take measurements 30 minutes after smoking and without any stress. This is another reason to quit smoking  it raises your blood pressure. °· Use a proper cuff size. Ask your caregiver if you are not sure about your cuff size. °· Most home blood pressure cuffs are automatic. They will measure systolic and diastolic pressures. The systolic pressure is the pressure reading at the start of sounds. Diastolic pressure is the pressure at which the sounds disappear. If you are elderly, measure pressures in multiple postures. Try sitting, lying or standing. °· Sit at rest for a minimum of 5 minutes before taking measurements. °· You should not be on any medications like decongestants. These are found in many cold medications. °· Record your blood pressure readings and review   them with your caregiver. °If you have hypertension: °· Your caregiver may do tests to be sure you do not have  secondary hypertension (see "causes" above). °· Your caregiver may also look for signs of metabolic syndrome. This is also called Syndrome X or Insulin Resistance Syndrome. You may have this syndrome if you have type 2 diabetes, abdominal obesity, and abnormal blood lipids in addition to hypertension. °· Your caregiver will take your medical and family history and perform a physical exam. °· Diagnostic tests may include blood tests (for glucose, cholesterol, potassium, and kidney function), a urinalysis, or an EKG. Other tests may also be necessary depending on your condition. °PREVENTION  °There are important lifestyle issues that you can adopt to reduce your chance of developing hypertension: °· Maintain a normal weight. °· Limit the amount of salt (sodium) in your diet. °· Exercise often. °· Limit alcohol intake. °· Get enough potassium in your diet. Discuss specific advice with your caregiver. °· Follow a DASH diet (dietary approaches to stop hypertension). This diet is rich in fruits, vegetables, and low-fat dairy products, and avoids certain fats. °PROGNOSIS  °Essential hypertension cannot be cured. Lifestyle changes and medical treatment can lower blood pressure and reduce complications. The prognosis of secondary hypertension depends on the underlying cause. Many people whose hypertension is controlled with medicine or lifestyle changes can live a normal, healthy life.  °RISKS AND COMPLICATIONS  °While high blood pressure alone is not an illness, it often requires treatment due to its short- and long-term effects on many organs. Hypertension increases your risk for: °· CVAs or strokes (cerebrovascular accident). °· Heart failure due to chronically high blood pressure (hypertensive cardiomyopathy). °· Heart attack (myocardial infarction). °· Damage to the retina (hypertensive retinopathy). °· Kidney failure (hypertensive nephropathy). °Your caregiver can explain list items above that apply to you. Treatment  of hypertension can significantly reduce the risk of complications. °TREATMENT  °· For overweight patients, weight loss and regular exercise are recommended. Physical fitness lowers blood pressure. °· Mild hypertension is usually treated with diet and exercise. A diet rich in fruits and vegetables, fat-free dairy products, and foods low in fat and salt (sodium) can help lower blood pressure. Decreasing salt intake decreases blood pressure in a 1/3 of people. °· Stop smoking if you are a smoker. °The steps above are highly effective in reducing blood pressure. While these actions are easy to suggest, they are difficult to achieve. Most patients with moderate or severe hypertension end up requiring medications to bring their blood pressure down to a normal level. There are several classes of medications for treatment. Blood pressure pills (antihypertensives) will lower blood pressure by their different actions. Lowering the blood pressure by 10 mmHg may decrease the risk of complications by as much as 25%. °The goal of treatment is effective blood pressure control. This will reduce your risk for complications. Your caregiver will help you determine the best treatment for you according to your lifestyle. What is excellent treatment for one person, may not be for you. °HOME CARE INSTRUCTIONS  °· Do not smoke. °· Follow the lifestyle changes outlined in the "Prevention" section. °· If you are on medications, follow the directions carefully. Blood pressure medications must be taken as prescribed. Skipping doses reduces their benefit. It also puts you at risk for problems. °· Follow up with your caregiver, as directed. °· If you are asked to monitor your blood pressure at home, follow the guidelines in the "Diagnosis" section above. °SEEK MEDICAL CARE   IF:  °· You think you are having medication side effects. °· You have recurrent headaches or lightheadedness. °· You have swelling in your ankles. °· You have trouble with  your vision. °SEEK IMMEDIATE MEDICAL CARE IF:  °· You have sudden onset of chest pain or pressure, difficulty breathing, or other symptoms of a heart attack. °· You have a severe headache. °· You have symptoms of a stroke (such as sudden weakness, difficulty speaking, difficulty walking). °MAKE SURE YOU:  °· Understand these instructions. °· Will watch your condition. °· Will get help right away if you are not doing well or get worse. °Document Released: 03/10/2005 Document Revised: 06/02/2011 Document Reviewed: 10/08/2006 °[employer]Care® Patient Information ©2014 [employer]. ° °

## 2013-09-13 ENCOUNTER — Encounter (HOSPITAL_COMMUNITY): Payer: Self-pay | Admitting: Emergency Medicine

## 2013-09-13 ENCOUNTER — Emergency Department (HOSPITAL_COMMUNITY)
Admission: EM | Admit: 2013-09-13 | Discharge: 2013-09-13 | Disposition: A | Discharge status: Home / Self Care | Payer: Medicaid Other | Attending: Emergency Medicine | Admitting: Emergency Medicine

## 2013-09-13 ENCOUNTER — Emergency Department (HOSPITAL_COMMUNITY): Payer: Medicaid Other

## 2013-09-13 DIAGNOSIS — I10 Essential (primary) hypertension: Secondary | ICD-10-CM | POA: Insufficient documentation

## 2013-09-13 DIAGNOSIS — Z87891 Personal history of nicotine dependence: Secondary | ICD-10-CM | POA: Insufficient documentation

## 2013-09-13 DIAGNOSIS — M25469 Effusion, unspecified knee: Secondary | ICD-10-CM | POA: Insufficient documentation

## 2013-09-13 DIAGNOSIS — R110 Nausea: Secondary | ICD-10-CM | POA: Insufficient documentation

## 2013-09-13 DIAGNOSIS — M25461 Effusion, right knee: Secondary | ICD-10-CM

## 2013-09-13 DIAGNOSIS — N289 Disorder of kidney and ureter, unspecified: Secondary | ICD-10-CM | POA: Insufficient documentation

## 2013-09-13 DIAGNOSIS — Z79899 Other long term (current) drug therapy: Secondary | ICD-10-CM | POA: Insufficient documentation

## 2013-09-13 LAB — COMPREHENSIVE METABOLIC PANEL
ALK PHOS: 108 U/L (ref 39–117)
ALT: 87 U/L — ABNORMAL HIGH (ref 0–53)
AST: 153 U/L — ABNORMAL HIGH (ref 0–37)
Albumin: 3.4 g/dL — ABNORMAL LOW (ref 3.5–5.2)
BILIRUBIN TOTAL: 1 mg/dL (ref 0.3–1.2)
BUN: 9 mg/dL (ref 6–23)
CHLORIDE: 97 meq/L (ref 96–112)
CO2: 23 meq/L (ref 19–32)
CREATININE: 1.42 mg/dL — AB (ref 0.50–1.35)
Calcium: 9.8 mg/dL (ref 8.4–10.5)
GFR calc Af Amer: 64 mL/min — ABNORMAL LOW (ref >90)
GFR, EST NON AFRICAN AMERICAN: 55 mL/min — AB (ref >90)
Glucose, Bld: 93 mg/dL (ref 70–99)
POTASSIUM: 4.2 meq/L (ref 3.7–5.3)
Sodium: 135 mEq/L — ABNORMAL LOW (ref 137–147)
Total Protein: 8.7 g/dL — ABNORMAL HIGH (ref 6.0–8.3)

## 2013-09-13 LAB — CBC WITH DIFFERENTIAL/PLATELET
Basophils Absolute: 0 10*3/uL (ref 0.0–0.1)
Basophils Relative: 0 % (ref 0–1)
Eosinophils Absolute: 0 10*3/uL (ref 0.0–0.7)
Eosinophils Relative: 1 % (ref 0–5)
HEMATOCRIT: 37.3 % — AB (ref 39.0–52.0)
HEMOGLOBIN: 13.5 g/dL (ref 13.0–17.0)
LYMPHS ABS: 0.8 10*3/uL (ref 0.7–4.0)
LYMPHS PCT: 29 % (ref 12–46)
MCH: 32 pg (ref 26.0–34.0)
MCHC: 36.2 g/dL — ABNORMAL HIGH (ref 30.0–36.0)
MCV: 88.4 fL (ref 78.0–100.0)
MONO ABS: 0.5 10*3/uL (ref 0.1–1.0)
MONOS PCT: 17 % — AB (ref 3–12)
NEUTROS ABS: 1.4 10*3/uL — AB (ref 1.7–7.7)
NEUTROS PCT: 53 % (ref 43–77)
Platelets: 132 10*3/uL — ABNORMAL LOW (ref 150–400)
RBC: 4.22 MIL/uL (ref 4.22–5.81)
RDW: 13.8 % (ref 11.5–15.5)
WBC: 2.7 10*3/uL — AB (ref 4.0–10.5)

## 2013-09-13 LAB — ETHANOL

## 2013-09-13 LAB — I-STAT TROPONIN, ED: TROPONIN I, POC: 0.01 ng/mL (ref 0.00–0.08)

## 2013-09-13 MED ORDER — TRAMADOL HCL 50 MG PO TABS: 50.0000 mg | ORAL_TABLET | Freq: Four times a day (QID) | ORAL | Status: DC | PRN

## 2013-09-13 NOTE — Discharge Instructions (Signed)
Knee Effusion ° Knee effusion means you have fluid in your knee. The knee may be more difficult to bend and move. °HOME CARE °· Use crutches or a brace as told by your doctor. °· Put ice on the injured area. °¨ Put ice in a plastic bag. °¨ Place a towel between your skin and the bag. °¨ Leave the ice on for 15-20 minutes, 03-04 times a day. °· Raise (elevate) your knee as much as possible. °· Only take medicine as told by your doctor. °· You may need to do strengthening exercises. Ask your doctor. °· Continue with your normal diet and activities as told by your doctor. °GET HELP RIGHT AWAY IF: °· You have more puffiness (swelling) in your knee. °· You see redness, puffiness, or have more pain in your knee. °· You have a temperature by mouth above 102° F (38.9° C). °· You get a rash. °· You have trouble breathing. °· You have a reaction to any medicine you are taking. °· You have a lot of pain when you move your knee. °MAKE SURE YOU: °· Understand these instructions. °· Will watch your condition. °· Will get help right away if you are not doing well or get worse. °Document Released: 04/12/2010 Document Revised: 06/02/2011 Document Reviewed: 04/12/2010 °[employer]® Patient Information ©2015 [employer]. This information is not intended to replace advice given to you by your health care provider. Make sure you discuss any questions you have with your health care provider. ° °

## 2013-09-13 NOTE — ED Notes (Signed)
Pt monitored by 5-lead, bp cuff, and pulse ox. 

## 2013-09-13 NOTE — ED Notes (Signed)
Pt placed into gown and on monitor upon arrival to room. Pt monitored by 5 lead, blood pressure, and pulse ox.  

## 2013-09-13 NOTE — ED Notes (Signed)
Pt. Stated, I've got high blood pressure, nausea, and my left knee goes out on me.

## 2013-09-13 NOTE — ED Notes (Signed)
Patient transported to X-ray 

## 2013-09-13 NOTE — ED Provider Notes (Signed)
CSN: [id number]     Arrival date & time 09/13/13  1342 History   First MD Initiated Contact with Patient 09/13/13 1419     Chief Complaint  Patient presents with  . Hypertension  . Knee Pain  . Nausea   HPI Patient presents to the emergency room with complaints of lightheadedness and left knee pain. Patient states the last couple of weeks has had pain in his left knee. Intermittently his knee will feel like it catches. Not had any recent falls or injuries.  Patient is also having episodes of feeling lightheaded. He has had some nausea with this as well. Feels like when he is walking around he will start to become dizzy. He denies any focal numbness or weakness. Is not having trouble with falls. Has not had any trouble with slurred speech. He does not have any abdominal pain no vomiting. Has not had any fevers. He has noticed a slight cough. Past Medical History  Diagnosis Date  . Hypertension    History reviewed. No pertinent past surgical history. No family history on file. History  Substance Use Topics  . Smoking status: Former Games developermoker  . Smokeless tobacco: Not on file  . Alcohol Use: Yes     Comment: Smells of ETOH  St's just stopped drinking    Review of Systems  All other systems reviewed and are negative.     Allergies  Review of patient's allergies indicates no known allergies.  Home Medications   Prior to Admission medications   Medication Sig Start Date End Date Taking? Authorizing Provider  amLODipine-benazepril (LOTREL) 5-40 MG per capsule Take 1 capsule by mouth daily.   Yes Historical Provider, MD  cloNIDine (CATAPRES) 0.2 MG tablet Take 0.2 mg by mouth 2 (two) times daily.   Yes Historical Provider, MD  traMADol (ULTRAM) 50 MG tablet Take 1 tablet (50 mg total) by mouth every 6 (six) hours as needed. 09/13/13   Linwood DibblesJon Knapp, MD   BP 141/101  Pulse 66  Temp(Src) 98 F (36.7 C) (Oral)  Resp 22  Ht 5\' 6"  (1.676 m)  Wt 153 lb (69.4 kg)  BMI 24.71 kg/m2  SpO2  99% Physical Exam  Nursing note and vitals reviewed. Constitutional: He appears well-developed and well-nourished. No distress.  HENT:  Head: Normocephalic and atraumatic.  Right Ear: External ear normal.  Left Ear: External ear normal.  Eyes: Conjunctivae are normal. Right eye exhibits no discharge. Left eye exhibits no discharge. No scleral icterus.  Neck: Neck supple. No tracheal deviation present.  Cardiovascular: Normal rate, regular rhythm and intact distal pulses.   Pulmonary/Chest: Effort normal and breath sounds normal. No stridor. No respiratory distress. He has no wheezes. He has no rales.  Occasional coughing  Abdominal: Soft. Bowel sounds are normal. He exhibits no distension. There is no tenderness. There is no rebound and no guarding.  Musculoskeletal: He exhibits no edema.       Left knee: He exhibits normal range of motion, no swelling, no effusion, no deformity and no erythema. Tenderness found.  Neurological: He is alert. He has normal strength. No cranial nerve deficit (no facial droop, extraocular movements intact, no slurred speech) or sensory deficit. He exhibits normal muscle tone. He displays no seizure activity. Coordination normal.  Skin: Skin is warm and dry. No rash noted.  Psychiatric: He has a normal mood and affect.    ED Course  Procedures (including critical care time) Labs Review Labs Reviewed  CBC WITH DIFFERENTIAL - Abnormal; Notable  for the following:    WBC 2.7 (*)    HCT 37.3 (*)    MCHC 36.2 (*)    Platelets 132 (*)    Neutro Abs 1.4 (*)    Monocytes Relative 17 (*)    All other components within normal limits  COMPREHENSIVE METABOLIC PANEL - Abnormal; Notable for the following:    Sodium 135 (*)    Creatinine, Ser 1.42 (*)    Total Protein 8.7 (*)    Albumin 3.4 (*)    AST 153 (*)    ALT 87 (*)    GFR calc non Af Amer 55 (*)    GFR calc Af Amer 64 (*)    All other components within normal limits  ETHANOL  URINALYSIS, ROUTINE W  REFLEX MICROSCOPIC  I-STAT TROPOININ, ED    Imaging Review Dg Chest 2 View  09/13/2013   CLINICAL DATA:  Hypertension  EXAM: CHEST  2 VIEW  COMPARISON:  08/02/2012  FINDINGS: Cardiomediastinal silhouette is unremarkable. Stable old left rib fracture. Stable chronic scarring in lingula. Mild hyperinflation. No acute infiltrate or pulmonary edema. Degenerative changes mid thoracic spine.  IMPRESSION: Mild hyperinflation. No active disease. Old left rib fractures. Stable scarring in the lingula.   Electronically Signed   By: Natasha MeadLiviu  Pop M.D.   On: 09/13/2013 16:41   Dg Knee Complete 4 Views Left  09/13/2013   CLINICAL DATA:  Anterior left knee pain, soft tissue swelling medially  EXAM: LEFT KNEE - COMPLETE 4+ VIEW  COMPARISON:  None.  FINDINGS: No fracture or dislocation. Mild femoral popliteal arterial calcification. Small to moderate joint effusion. No significant joint space narrowing or osteophyte formation.  IMPRESSION: Joint effusion.   Electronically Signed   By: Esperanza Heiraymond  Rubner M.D.   On: 09/13/2013 16:39    EKG Normal sinus rhythm left ventricular hypertrophy ST elevation consistent with pericarditis or early repolarization No prior EKG for comparison MDM   Final diagnoses:  Knee effusion, right  Renal insufficiency    Pt with increase in his creatinine.  May be related to his blood pressure.  No signs of dehydration.  Will have him follow up with PCP  Knee pain is related to an effusion.  NO sign of infection.  May have internal derangement of the meniscus.  Follow up with ortho  At this time there does not appear to be any evidence of an acute emergency medical condition and the patient appears stable for discharge with appropriate outpatient follow up.     Linwood DibblesJon Knapp, MD 09/13/13 [phone]

## 2015-09-07 ENCOUNTER — Emergency Department (HOSPITAL_COMMUNITY): Payer: Medicaid Other

## 2015-09-07 ENCOUNTER — Inpatient Hospital Stay (HOSPITAL_COMMUNITY)
Admission: EM | Admit: 2015-09-07 | Discharge: 2015-09-21 | DRG: 896 | Disposition: A | Discharge status: Home / Self Care | Payer: Medicaid Other | Attending: Internal Medicine | Admitting: Internal Medicine

## 2015-09-07 ENCOUNTER — Encounter (HOSPITAL_COMMUNITY): Payer: Self-pay | Admitting: Emergency Medicine

## 2015-09-07 DIAGNOSIS — E860 Dehydration: Secondary | ICD-10-CM | POA: Diagnosis present

## 2015-09-07 DIAGNOSIS — I2510 Atherosclerotic heart disease of native coronary artery without angina pectoris: Secondary | ICD-10-CM | POA: Diagnosis present

## 2015-09-07 DIAGNOSIS — I10 Essential (primary) hypertension: Secondary | ICD-10-CM

## 2015-09-07 DIAGNOSIS — Z9114 Patient's other noncompliance with medication regimen: Secondary | ICD-10-CM

## 2015-09-07 DIAGNOSIS — G312 Degeneration of nervous system due to alcohol: Secondary | ICD-10-CM | POA: Diagnosis present

## 2015-09-07 DIAGNOSIS — E162 Hypoglycemia, unspecified: Secondary | ICD-10-CM | POA: Diagnosis present

## 2015-09-07 DIAGNOSIS — Z91199 Patient's noncompliance with other medical treatment and regimen due to unspecified reason: Secondary | ICD-10-CM | POA: Diagnosis present

## 2015-09-07 DIAGNOSIS — I110 Hypertensive heart disease with heart failure: Secondary | ICD-10-CM | POA: Diagnosis present

## 2015-09-07 DIAGNOSIS — E871 Hypo-osmolality and hyponatremia: Secondary | ICD-10-CM | POA: Diagnosis not present

## 2015-09-07 DIAGNOSIS — B9620 Unspecified Escherichia coli [E. coli] as the cause of diseases classified elsewhere: Secondary | ICD-10-CM | POA: Diagnosis not present

## 2015-09-07 DIAGNOSIS — I5032 Chronic diastolic (congestive) heart failure: Secondary | ICD-10-CM | POA: Diagnosis present

## 2015-09-07 DIAGNOSIS — R1013 Epigastric pain: Secondary | ICD-10-CM | POA: Diagnosis present

## 2015-09-07 DIAGNOSIS — Y92019 Unspecified place in single-family (private) house as the place of occurrence of the external cause: Secondary | ICD-10-CM | POA: Diagnosis not present

## 2015-09-07 DIAGNOSIS — Z720 Tobacco use: Secondary | ICD-10-CM | POA: Diagnosis present

## 2015-09-07 DIAGNOSIS — K8020 Calculus of gallbladder without cholecystitis without obstruction: Secondary | ICD-10-CM | POA: Diagnosis present

## 2015-09-07 DIAGNOSIS — R062 Wheezing: Secondary | ICD-10-CM

## 2015-09-07 DIAGNOSIS — J690 Pneumonitis due to inhalation of food and vomit: Secondary | ICD-10-CM | POA: Diagnosis not present

## 2015-09-07 DIAGNOSIS — E876 Hypokalemia: Secondary | ICD-10-CM

## 2015-09-07 DIAGNOSIS — R112 Nausea with vomiting, unspecified: Secondary | ICD-10-CM

## 2015-09-07 DIAGNOSIS — F1010 Alcohol abuse, uncomplicated: Secondary | ICD-10-CM

## 2015-09-07 DIAGNOSIS — H269 Unspecified cataract: Secondary | ICD-10-CM | POA: Diagnosis present

## 2015-09-07 DIAGNOSIS — I160 Hypertensive urgency: Secondary | ICD-10-CM | POA: Diagnosis present

## 2015-09-07 DIAGNOSIS — R410 Disorientation, unspecified: Secondary | ICD-10-CM | POA: Diagnosis not present

## 2015-09-07 DIAGNOSIS — F17210 Nicotine dependence, cigarettes, uncomplicated: Secondary | ICD-10-CM | POA: Diagnosis present

## 2015-09-07 DIAGNOSIS — K7010 Alcoholic hepatitis without ascites: Secondary | ICD-10-CM | POA: Diagnosis present

## 2015-09-07 DIAGNOSIS — R509 Fever, unspecified: Secondary | ICD-10-CM

## 2015-09-07 DIAGNOSIS — Z9119 Patient's noncompliance with other medical treatment and regimen: Secondary | ICD-10-CM

## 2015-09-07 DIAGNOSIS — R55 Syncope and collapse: Secondary | ICD-10-CM | POA: Diagnosis present

## 2015-09-07 DIAGNOSIS — R4182 Altered mental status, unspecified: Secondary | ICD-10-CM

## 2015-09-07 DIAGNOSIS — T50906A Underdosing of unspecified drugs, medicaments and biological substances, initial encounter: Secondary | ICD-10-CM | POA: Diagnosis present

## 2015-09-07 DIAGNOSIS — F10231 Alcohol dependence with withdrawal delirium: Secondary | ICD-10-CM | POA: Diagnosis present

## 2015-09-07 DIAGNOSIS — G92 Toxic encephalopathy: Secondary | ICD-10-CM | POA: Diagnosis not present

## 2015-09-07 DIAGNOSIS — Z23 Encounter for immunization: Secondary | ICD-10-CM

## 2015-09-07 DIAGNOSIS — R0902 Hypoxemia: Secondary | ICD-10-CM | POA: Diagnosis not present

## 2015-09-07 DIAGNOSIS — N390 Urinary tract infection, site not specified: Secondary | ICD-10-CM | POA: Diagnosis not present

## 2015-09-07 DIAGNOSIS — Z91128 Patient's intentional underdosing of medication regimen for other reason: Secondary | ICD-10-CM

## 2015-09-07 DIAGNOSIS — N179 Acute kidney failure, unspecified: Secondary | ICD-10-CM | POA: Diagnosis present

## 2015-09-07 LAB — CBC WITH DIFFERENTIAL/PLATELET
BASOS PCT: 0 %
Basophils Absolute: 0 10*3/uL (ref 0.0–0.1)
EOS PCT: 2 %
Eosinophils Absolute: 0.1 10*3/uL (ref 0.0–0.7)
HEMATOCRIT: 41.4 % (ref 39.0–52.0)
Hemoglobin: 14.6 g/dL (ref 13.0–17.0)
Lymphocytes Relative: 30 %
Lymphs Abs: 1.7 10*3/uL (ref 0.7–4.0)
MCH: 32 pg (ref 26.0–34.0)
MCHC: 35.3 g/dL (ref 30.0–36.0)
MCV: 90.8 fL (ref 78.0–100.0)
MONO ABS: 0.4 10*3/uL (ref 0.1–1.0)
MONOS PCT: 8 %
NEUTROS ABS: 3.3 10*3/uL (ref 1.7–7.7)
Neutrophils Relative %: 60 %
PLATELETS: 221 10*3/uL (ref 150–400)
RBC: 4.56 MIL/uL (ref 4.22–5.81)
RDW: 14.4 % (ref 11.5–15.5)
WBC: 5.5 10*3/uL (ref 4.0–10.5)

## 2015-09-07 LAB — PROTIME-INR
INR: 1.12 (ref 0.00–1.49)
Prothrombin Time: 14.6 seconds (ref 11.6–15.2)

## 2015-09-07 LAB — URINALYSIS, ROUTINE W REFLEX MICROSCOPIC
GLUCOSE, UA: NEGATIVE mg/dL
Hgb urine dipstick: NEGATIVE
Ketones, ur: 15 mg/dL — AB
LEUKOCYTES UA: NEGATIVE
NITRITE: NEGATIVE
PH: 5.5 (ref 5.0–8.0)
Protein, ur: 100 mg/dL — AB
SPECIFIC GRAVITY, URINE: 1.015 (ref 1.005–1.030)

## 2015-09-07 LAB — COMPREHENSIVE METABOLIC PANEL
ALBUMIN: 3.6 g/dL (ref 3.5–5.0)
ALK PHOS: 102 U/L (ref 38–126)
ALT: 90 U/L — AB (ref 17–63)
ANION GAP: 12 (ref 5–15)
AST: 150 U/L — AB (ref 15–41)
BILIRUBIN TOTAL: 1.9 mg/dL — AB (ref 0.3–1.2)
BUN: 11 mg/dL (ref 6–20)
CALCIUM: 9.7 mg/dL (ref 8.9–10.3)
CO2: 28 mmol/L (ref 22–32)
Chloride: 89 mmol/L — ABNORMAL LOW (ref 101–111)
Creatinine, Ser: 1.3 mg/dL — ABNORMAL HIGH (ref 0.61–1.24)
GFR calc Af Amer: >60 mL/min (ref >60)
GFR calc non Af Amer: >60 mL/min (ref >60)
GLUCOSE: 90 mg/dL (ref 65–99)
Potassium: 2.9 mmol/L — ABNORMAL LOW (ref 3.5–5.1)
SODIUM: 129 mmol/L — AB (ref 135–145)
TOTAL PROTEIN: 8.7 g/dL — AB (ref 6.5–8.1)

## 2015-09-07 LAB — MAGNESIUM: Magnesium: 1.5 mg/dL — ABNORMAL LOW (ref 1.7–2.4)

## 2015-09-07 LAB — URINE MICROSCOPIC-ADD ON

## 2015-09-07 LAB — ETHANOL: Alcohol, Ethyl (B): <5 mg/dL (ref <5)

## 2015-09-07 LAB — TROPONIN I

## 2015-09-07 LAB — MRSA PCR SCREENING: MRSA BY PCR: NEGATIVE

## 2015-09-07 LAB — LIPASE, BLOOD: Lipase: 25 U/L (ref 11–51)

## 2015-09-07 MED ORDER — HYDROCODONE-ACETAMINOPHEN 5-325 MG PO TABS: 1.0000 | ORAL_TABLET | ORAL | Status: DC | PRN

## 2015-09-07 MED ORDER — NICOTINE 21 MG/24HR TD PT24
21.0000 mg | MEDICATED_PATCH | Freq: Every day | TRANSDERMAL | Status: DC
  Administered 2015-09-07 – 2015-09-21 (×14): 21 mg via TRANSDERMAL
  Filled 2015-09-07 (×15): qty 1

## 2015-09-07 MED ORDER — PROMETHAZINE HCL 25 MG PO TABS: 25.0000 mg | ORAL_TABLET | Freq: Four times a day (QID) | ORAL | Status: DC | PRN

## 2015-09-07 MED ORDER — LORAZEPAM 2 MG/ML IJ SOLN
2.0000 mg | INTRAMUSCULAR | Status: DC | PRN
Start: 2015-09-07 — End: 2015-09-12
  Administered 2015-09-07 – 2015-09-08 (×4): 2 mg via INTRAVENOUS
  Administered 2015-09-08: 3 mg via INTRAVENOUS
  Administered 2015-09-08 (×4): 2 mg via INTRAVENOUS
  Administered 2015-09-08: 3 mg via INTRAVENOUS
  Administered 2015-09-08 – 2015-09-09 (×5): 2 mg via INTRAVENOUS
  Administered 2015-09-09: 3 mg via INTRAVENOUS
  Administered 2015-09-09: 2 mg via INTRAVENOUS
  Administered 2015-09-09: 1 mg via INTRAVENOUS
  Administered 2015-09-09 (×2): 2 mg via INTRAVENOUS
  Administered 2015-09-09: 1 mg via INTRAVENOUS
  Administered 2015-09-09: 3 mg via INTRAVENOUS
  Administered 2015-09-09: 2 mg via INTRAVENOUS
  Administered 2015-09-09: 3 mg via INTRAVENOUS
  Administered 2015-09-09 (×3): 2 mg via INTRAVENOUS
  Administered 2015-09-09: 3 mg via INTRAVENOUS
  Filled 2015-09-07 (×6): qty 1
  Filled 2015-09-07: qty 2
  Filled 2015-09-07 (×3): qty 1
  Filled 2015-09-07 (×2): qty 2
  Filled 2015-09-07 (×9): qty 1
  Filled 2015-09-07 (×2): qty 2
  Filled 2015-09-07: qty 1
  Filled 2015-09-07: qty 2
  Filled 2015-09-07 (×3): qty 1
  Filled 2015-09-07: qty 2

## 2015-09-07 MED ORDER — ACETAMINOPHEN 325 MG PO TABS
650.0000 mg | ORAL_TABLET | Freq: Four times a day (QID) | ORAL | Status: DC | PRN
  Filled 2015-09-07: qty 2

## 2015-09-07 MED ORDER — VITAMIN B-1 100 MG PO TABS
100.0000 mg | ORAL_TABLET | Freq: Every day | ORAL | Status: DC
  Administered 2015-09-07 – 2015-09-08 (×3): 100 mg via ORAL
  Filled 2015-09-07 (×5): qty 1

## 2015-09-07 MED ORDER — AMLODIPINE BESYLATE 5 MG PO TABS
5.0000 mg | ORAL_TABLET | Freq: Once | ORAL | Status: AC
  Administered 2015-09-07: 5 mg via ORAL
  Filled 2015-09-07: qty 1

## 2015-09-07 MED ORDER — LORAZEPAM BOLUS VIA INFUSION
1.0000 mg | Freq: Once | INTRAVENOUS | Status: DC | PRN
  Filled 2015-09-07: qty 1

## 2015-09-07 MED ORDER — CLONIDINE HCL 0.2 MG PO TABS
0.2000 mg | ORAL_TABLET | Freq: Once | ORAL | Status: AC | PRN
  Administered 2015-09-07: 0.2 mg via ORAL
  Filled 2015-09-07: qty 1

## 2015-09-07 MED ORDER — SENNOSIDES-DOCUSATE SODIUM 8.6-50 MG PO TABS: 1.0000 | ORAL_TABLET | Freq: Every evening | ORAL | Status: DC | PRN

## 2015-09-07 MED ORDER — BISACODYL 10 MG RE SUPP
10.0000 mg | Freq: Every day | RECTAL | Status: DC | PRN
Start: 2015-09-07 — End: 2015-09-21

## 2015-09-07 MED ORDER — ONDANSETRON HCL 4 MG PO TABS: 4.0000 mg | ORAL_TABLET | Freq: Four times a day (QID) | ORAL | Status: DC | PRN

## 2015-09-07 MED ORDER — MAGNESIUM OXIDE 400 (241.3 MG) MG PO TABS
400.0000 mg | ORAL_TABLET | Freq: Once | ORAL | Status: AC
  Administered 2015-09-07: 400 mg via ORAL
  Filled 2015-09-07 (×2): qty 1

## 2015-09-07 MED ORDER — AMLODIPINE BESYLATE 5 MG PO TABS: 5.0000 mg | ORAL_TABLET | Freq: Every day | ORAL | Status: DC

## 2015-09-07 MED ORDER — HYDRALAZINE HCL 20 MG/ML IJ SOLN
5.0000 mg | Freq: Three times a day (TID) | INTRAMUSCULAR | Status: DC | PRN
  Administered 2015-09-07: 5 mg via INTRAVENOUS
  Administered 2015-09-08: 10 mg via INTRAVENOUS
  Filled 2015-09-07 (×4): qty 1

## 2015-09-07 MED ORDER — HEPARIN SODIUM (PORCINE) 5000 UNIT/ML IJ SOLN
5000.0000 [IU] | Freq: Three times a day (TID) | INTRAMUSCULAR | Status: DC
  Administered 2015-09-07 – 2015-09-21 (×40): 5000 [IU] via SUBCUTANEOUS
  Filled 2015-09-07 (×41): qty 1

## 2015-09-07 MED ORDER — POTASSIUM CHLORIDE CRYS ER 20 MEQ PO TBCR: 20.0000 meq | EXTENDED_RELEASE_TABLET | Freq: Every day | ORAL | Status: DC

## 2015-09-07 MED ORDER — FOLIC ACID 1 MG PO TABS
1.0000 mg | ORAL_TABLET | Freq: Every day | ORAL | Status: DC
  Administered 2015-09-07 – 2015-09-21 (×12): 1 mg via ORAL
  Filled 2015-09-07 (×16): qty 1

## 2015-09-07 MED ORDER — ADULT MULTIVITAMIN W/MINERALS CH
1.0000 | ORAL_TABLET | Freq: Every day | ORAL | Status: DC
  Administered 2015-09-07 – 2015-09-21 (×12): 1 via ORAL
  Filled 2015-09-07 (×15): qty 1

## 2015-09-07 MED ORDER — MAGNESIUM OXIDE 400 MG PO TABS: 400.0000 mg | ORAL_TABLET | Freq: Two times a day (BID) | ORAL | Status: DC

## 2015-09-07 MED ORDER — MORPHINE SULFATE (PF) 4 MG/ML IV SOLN
4.0000 mg | Freq: Once | INTRAVENOUS | Status: AC
  Administered 2015-09-07: 4 mg via INTRAVENOUS
  Filled 2015-09-07: qty 1

## 2015-09-07 MED ORDER — AMLODIPINE BESYLATE 5 MG PO TABS
5.0000 mg | ORAL_TABLET | Freq: Every day | ORAL | Status: DC
Start: 2015-09-07 — End: 2015-09-07

## 2015-09-07 MED ORDER — ONDANSETRON HCL 4 MG/2ML IJ SOLN
4.0000 mg | Freq: Once | INTRAMUSCULAR | Status: AC
  Administered 2015-09-07: 4 mg via INTRAVENOUS
  Filled 2015-09-07: qty 2

## 2015-09-07 MED ORDER — POTASSIUM CHLORIDE CRYS ER 20 MEQ PO TBCR
40.0000 meq | EXTENDED_RELEASE_TABLET | Freq: Once | ORAL | Status: DC
  Filled 2015-09-07: qty 2

## 2015-09-07 MED ORDER — SODIUM CHLORIDE 0.9 % IV SOLN
INTRAVENOUS | Status: DC
  Administered 2015-09-07: 17:00:00 via INTRAVENOUS

## 2015-09-07 MED ORDER — FAMOTIDINE IN NACL 20-0.9 MG/50ML-% IV SOLN
20.0000 mg | Freq: Once | INTRAVENOUS | Status: AC
  Administered 2015-09-07: 20 mg via INTRAVENOUS
  Filled 2015-09-07: qty 50

## 2015-09-07 MED ORDER — MAGNESIUM CITRATE PO SOLN: 1.0000 | Freq: Once | ORAL | Status: DC | PRN

## 2015-09-07 MED ORDER — POTASSIUM CHLORIDE CRYS ER 20 MEQ PO TBCR
40.0000 meq | EXTENDED_RELEASE_TABLET | Freq: Once | ORAL | Status: AC
Start: 2015-09-07 — End: 2015-09-07
  Administered 2015-09-07: 40 meq via ORAL

## 2015-09-07 MED ORDER — IOPAMIDOL (ISOVUE-300) INJECTION 61%
INTRAVENOUS | Status: AC
  Administered 2015-09-07: 80 mL via INTRAVENOUS
  Filled 2015-09-07: qty 100

## 2015-09-07 MED ORDER — ONDANSETRON HCL 4 MG/2ML IJ SOLN
4.0000 mg | Freq: Four times a day (QID) | INTRAMUSCULAR | Status: DC | PRN
Start: 2015-09-07 — End: 2015-09-21

## 2015-09-07 MED ORDER — PNEUMOCOCCAL VAC POLYVALENT 25 MCG/0.5ML IJ INJ
0.5000 mL | INJECTION | INTRAMUSCULAR | Status: AC
  Administered 2015-09-11: 0.5 mL via INTRAMUSCULAR
  Filled 2015-09-07 (×2): qty 0.5

## 2015-09-07 MED ORDER — ACETAMINOPHEN 650 MG RE SUPP
650.0000 mg | Freq: Four times a day (QID) | RECTAL | Status: DC | PRN
  Administered 2015-09-09: 650 mg via RECTAL
  Filled 2015-09-07: qty 1

## 2015-09-07 MED ORDER — SODIUM CHLORIDE 0.9 % IV BOLUS (SEPSIS)
1000.0000 mL | Freq: Once | INTRAVENOUS | Status: AC
  Administered 2015-09-07: 1000 mL via INTRAVENOUS

## 2015-09-07 NOTE — ED Notes (Signed)
Pt. reports dizziness , nausea/emesis , mid abdominal pain and elevated blood pressure onset 2 days ago .

## 2015-09-07 NOTE — H&P (Signed)
History and Physical    Austin RoughWilliam D Janota ZOX:[id number] DOB: May 14, 1960 DOA: 09/07/2015   PCP: Billee CashingMCKENZIE, WAYLAND, MD   Patient coming from:  Home   Chief Complaint: Dizziness   HPI: Austin Mendez is a 55 y.o. male with medical history significant for  poorly controlled HTN, ETOH abuse, presenting to to the ED with nausea and vomiting, diziness with onset about 4 days ago, accompanied by mild epigastric pain without diarrhea,.  Denies fevers, chills, night sweats, vision changes, or mucositis. Denies any respiratory complaints. Denies any chest pain or palpitations. Denies lower extremity swelling.  Appetite is normal. Denies any dysuria. Denies abnormal skin rashes, or neuropathy. Denies any bleeding issues such as epistaxis, hematemesis, hematuria or hematochezia. No history of withrawals in the past. He was dehydrated, given IVF in the Er. He also was hypokalemic, hypomagnesemic and his BBP was very elevated requiring IV antihypertensive. At the ED, electrolytes and IVF were replenished.  He was to ambulate to the bathroom,  But became very dizzy for which it felt prudent to admit management of symptoms    ED Course:  BP 171/100 mmHg  Pulse 65  Temp(Src) 97.4 F (36.3 C) (Oral)  Resp 12  SpO2 93%  Mg 1.5, K 2.9, T bil 1.9, Cr 1.3, AST 150. ALT 90. Lipase pending.  Tn neg, EKG without ACS ETOH pending  Hcult pending  CT head negative CT A/P remark for Cholelithiasis.  Gallbladder wall does not appear thickened. There are colonic diverticula without diverticulitis. No bowel obstruction. No abscess. Appendix appears normal. No renal or ureteral calculus.  No hydronephrosis. There are scattered foci of coronary artery calcification. There is calcification at multiple sites in the aorta and common iliac arteries.   Received  Norvasc 5 m tab po, MAgox 400 mg x1, Zofran, KCL 40 meq    Review of Systems: As per HPI otherwise 10 point review of systems negative.   Past Medical History    Diagnosis Date  . Hypertension   . Syncope and collapse   . Dizziness and giddiness   . Shortness of breath   . Unspecified essential hypertension     History reviewed. No pertinent past surgical history.  Social History Social History   Social History  . Marital Status: Married    Spouse Name: N/A  . Number of Children: N/A  . Years of Education: N/A   Occupational History  . Not on file.   Social History Main Topics  . Smoking status: Former Games developermoker  . Smokeless tobacco: Not on file  . Alcohol Use: Yes     Comment: Smells of ETOH  St's just stopped drinking  . Drug Use: Yes    Special: Marijuana  . Sexual Activity: Not on file     Comment: St's stopped 2 yrs ago   Other Topics Concern  . Not on file   Social History Narrative     No Known Allergies  No family history on file.    Prior to Admission medications   Medication Sig Start Date End Date Taking? Authorizing Provider  amLODipine (NORVASC) 5 MG tablet Take 1 tablet (5 mg total) by mouth daily. 09/07/15   Nicole Pisciotta, PA-C  magnesium oxide (MAG-OX) 400 MG tablet Take 1 tablet (400 mg total) by mouth 2 (two) times daily. 09/07/15   Nicole Pisciotta, PA-C  potassium chloride SA (K-DUR,KLOR-CON) 20 MEQ tablet Take 1 tablet (20 mEq total) by mouth daily. 09/07/15   Nicole Pisciotta, PA-C  promethazine (PHENERGAN)  25 MG tablet Take 1 tablet (25 mg total) by mouth every 6 (six) hours as needed for nausea or vomiting. 09/07/15   Joni Reining Pisciotta, PA-C  traMADol (ULTRAM) 50 MG tablet Take 1 tablet (50 mg total) by mouth every 6 (six) hours as needed. Patient not taking: Reported on 09/07/2015 09/13/13   Linwood Dibbles, MD    Physical Exam:    Filed Vitals:   09/07/15 1213 09/07/15 1300 09/07/15 1417 09/07/15 1430  BP: 188/113 170/118 178/121 171/100  Pulse:  64 65 65  Temp:      TempSrc:      Resp:   13 12  SpO2:  96% 90% 93%      Constitutional: NAD, calm, comfortable Filed Vitals:   09/07/15 1213  09/07/15 1300 09/07/15 1417 09/07/15 1430  BP: 188/113 170/118 178/121 171/100  Pulse:  64 65 65  Temp:      TempSrc:      Resp:   13 12  SpO2:  96% 90% 93%   Eyes: PERRL, lids and conjunctivae normal ENMT: Mucous membranes are moist. Posterior pharynx clear of any exudate or lesions. Mot teeth are missing Neck: normal, supple, no masses, no thyromegaly Respiratory: clear to auscultation bilaterally, no wheezing, no crackles. Normal respiratory effort. No accessory muscle use.  Cardiovascular: Regular rate and rhythm, no murmurs / rubs / gallops. No extremity edema. 2+ pedal pulses. No carotid bruits.  Abdomen: mild epigastric  tenderness, no masses palpated. No hepatosplenomegaly. Bowel sounds positive.  Musculoskeletal: no clubbing / cyanosis. No joint deformity upper and lower extremities.   Skin: no rashes, lesions, ulcers.  Neurologic: CN 2-12 grossly intact. Sensation intact, DTR normal. Strength 5/5 in all 4.  Speech somewhat  Distorted,  but this may be baseline Psychiatric: Normal judgment and insight. Alert and oriented x 3. Normal mood.     Labs on Admission: I have personally reviewed following labs and imaging studies  CBC:  Recent Labs Lab 09/07/15 0600  WBC 5.5  NEUTROABS 3.3  HGB 14.6  HCT 41.4  MCV 90.8  PLT 221    Basic Metabolic Panel:  Recent Labs Lab 09/07/15 0600 09/07/15 0722  NA 129*  --   K 2.9*  --   CL 89*  --   CO2 28  --   GLUCOSE 90  --   BUN 11  --   CREATININE 1.30*  --   CALCIUM 9.7  --   MG  --  1.5*    GFR: CrCl cannot be calculated (Unknown ideal weight.).  Liver Function Tests:  Recent Labs Lab 09/07/15 0600  AST 150*  ALT 90*  ALKPHOS 102  BILITOT 1.9*  PROT 8.7*  ALBUMIN 3.6    Recent Labs Lab 09/07/15 0600  LIPASE 25   No results for input(s): AMMONIA in the last 168 hours.  Coagulation Profile: No results for input(s): INR, PROTIME in the last 168 hours.  Cardiac Enzymes:  Recent Labs Lab  09/07/15 0600  TROPONINI <0.03    BNP (last 3 results) No results for input(s): PROBNP in the last 8760 hours.  HbA1C: No results for input(s): HGBA1C in the last 72 hours.  CBG: No results for input(s): GLUCAP in the last 168 hours.  Lipid Profile: No results for input(s): CHOL, HDL, LDLCALC, TRIG, CHOLHDL, LDLDIRECT in the last 72 hours.  Thyroid Function Tests: No results for input(s): TSH, T4TOTAL, FREET4, T3FREE, THYROIDAB in the last 72 hours.  Anemia Panel: No results for input(s): VITAMINB12, FOLATE, FERRITIN,  TIBC, IRON, RETICCTPCT in the last 72 hours.  Urine analysis:    Component Value Date/Time   COLORURINE AMBER* 09/07/2015 0806   APPEARANCEUR CLEAR 09/07/2015 0806   LABSPEC 1.015 09/07/2015 0806   PHURINE 5.5 09/07/2015 0806   GLUCOSEU NEGATIVE 09/07/2015 0806   HGBUR NEGATIVE 09/07/2015 0806   BILIRUBINUR SMALL* 09/07/2015 0806   KETONESUR 15* 09/07/2015 0806   PROTEINUR 100* 09/07/2015 0806   UROBILINOGEN 2.0* 04/28/2013 1602   NITRITE NEGATIVE 09/07/2015 0806   LEUKOCYTESUR NEGATIVE 09/07/2015 0806    Sepsis Labs: @LABRCNTIP (procalcitonin:4,lacticidven:4) )No results found for this or any previous visit (from the past 240 hour(s)).   Radiological Exams on Admission: Dg Chest 2 View  09/07/2015  CLINICAL DATA:  Dizziness and hypertension EXAM: CHEST  2 VIEW COMPARISON:  09/13/2013 FINDINGS: Cardiac shadow is within normal limits. The lungs are well aerated bilaterally. Old rib fractures with healing are noted again on the left. Mild scarring is again seen in the lingula. No acute abnormality noted. IMPRESSION: Chronic changes without acute abnormality. Electronically Signed   By: Alcide Clever M.D.   On: 09/07/2015 06:45   Ct Head Wo Contrast  09/07/2015  CLINICAL DATA:  Dizziness and syncope EXAM: CT HEAD WITHOUT CONTRAST TECHNIQUE: Contiguous axial images were obtained from the base of the skull through the vertex without intravenous contrast.  COMPARISON:  April 28, 2013 FINDINGS: There is mild diffuse atrophy. There is no intracranial mass, hemorrhage, extra-axial fluid collection, or midline shift. There is small vessel disease throughout the centra semiovale bilaterally. There is small vessel disease in the left thalamus as well. No acute infarct evident. The knee calvarium appears intact. Visualized mastoid air cells are clear. No intraorbital lesions are evident. 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 No acute infarct evident. No hemorrhage or mass effect. Electronically Signed   By: Bretta Bang III M.D.   On: 09/07/2015 12:43   Ct Abdomen Pelvis W Contrast  09/07/2015  CLINICAL DATA:  Abdominal pain with nausea and vomiting for 4 days EXAM: CT ABDOMEN AND PELVIS WITH CONTRAST TECHNIQUE: Multidetector CT imaging of the abdomen and pelvis was performed using the standard protocol following bolus administration of intravenous contrast. CONTRAST:  75 mL ISOVUE-300 IOPAMIDOL (ISOVUE-300) INJECTION 61% COMPARISON:  None. FINDINGS: Lower chest: There is scarring in the anterior left lung base. There is no lung base edema or consolidation. There are scattered foci of coronary artery calcification. Hepatobiliary: There is a 7 mm cyst near the dome of the liver on the right anteriorly. No other focal liver lesion is evident on this study. There is cholelithiasis. Gallbladder wall is not appreciably thickened. There is no biliary duct dilatation. Pancreas: There is no pancreatic mass or inflammatory focus. Pancreatic duct is upper normal in size in the head and body region. No lesion within the pancreatic duct is seen by CT. Spleen: No splenic lesions are evident. Adrenals/Urinary Tract: Adrenals appear normal bilaterally. There is no appreciable renal mass or hydronephrosis. There is no renal or ureteral calculus on either side. Urinary bladder is midline with wall thickness within normal  limits. Stomach/Bowel: There are scattered sigmoid diverticula without diverticulitis. Occasional colonic diverticula are noted elsewhere in the colon without inflammation. There is no appreciable bowel wall or mesenteric thickening. There is no bowel obstruction. No free air or portal venous air. Vascular/Lymphatic: There is atherosclerotic calcification throughout the mid the distal aorta and common iliac arteries bilaterally. There is moderate calcification in the proximal superior mesenteric  artery without high-grade obstruction seen. No vascular lesions are evident beyond areas of calcification. There is no adenopathy in the abdomen or pelvis by size criteria. There are scattered subcentimeter periportal lymph nodes. Reproductive: Prostate and seminal vesicles appear normal in size and contour. There is no pelvic mass or pelvic fluid collection. Other: Appendix appears unremarkable. There is no abscess or ascites in the abdomen or pelvis. There is fat in the left inguinal ring. Musculoskeletal: There is vacuum phenomenon in the L4-5 disc. There are areas of degenerative type change in the lumbar spine. There is evidence of an old healed rib fracture on the left laterally. There are no blastic or lytic bone lesions. There is osteoarthritic change in the right sacroiliac joint. There is also osteoarthritic change in both hip joints. There is no intramuscular or abdominal wall lesion. IMPRESSION: Cholelithiasis.  Gallbladder wall does not appear thickened. There are colonic diverticula without diverticulitis. No bowel obstruction. No abscess. Appendix appears normal. No renal or ureteral calculus.  No hydronephrosis. There are scattered foci of coronary artery calcification. There is calcification at multiple sites in the aorta and common iliac arteries. Electronically Signed   By: Bretta Bang III M.D.   On: 09/07/2015 09:01    ZOX:[id number] rhythm  Atrial premature complex Probable left ventricular  hypertrophy Anterior ST elevation, probably due to LVH Borderline prolonged QT interval QTC 480  Assessment/Plan Principal Problem:   Malignant hypertension Active Problems:   Alcohol abuse   Essential hypertension   Syncope and collapse   Hypokalemia   Acute kidney injury (HCC)   Hypomagnesemia   Epigastric pain   ETOH abuse    Malignant Hypertension BP 170/118 mmHg  Pulse 64 . Patient is non compliant with meds, has not refilled his antihypertensives in over a year. His BP has been running in the 170's/110's for at least 1 year. Highest BP on here 195/ syst-130 diast . Troponin negative  Restart home lisinopril  Add Hydralazine Q6 hours as needed for BP 160/90  Will need long term BP meds and compliance upon dc . Consult Case management for home meds needs  Dizziness with presyncopal episode likely due to dehydration in conjunction with ETOH withdrawal    Acute worsening at the ED where patient felt extremely nauseous, weak, and was unable to stand due to gait instability. CT head negative  Tn negative  and EKG with LVH without changes from prior studies. 2 D echo in 2014  CHF with Gr 1 DD.   IV fluids given at the ED with some improvement of his dizziness - Meclizine when necessary     Continue IVF - orthostatics    Alcohol abuse and dependence and possible withdrawal. Last drink 4 days ago., likely in active withdrawal  -   Admit to SDU  -  CIWA with Ativan per protocol -  Thiamine, folate, and MVI    UDS   Epigastric Pain ,not severe in the setting of ETOH abuse and withdrawal  Lipase , AST /ALT150/90. Alkaline phosphatase 102. t bil 1.9  AF VSS, WBC normal at 5.5. CT abdomen showing  Cholelithiasis.  Gallbladder wall does not appear thickened.  No bowel obstruction. No abscess. No evidence of cirrhosis or pancreatitis.   ETOH drinker.  No jaundice noted  - Gentle IVF  - lipid panel    Repeat CMet in am  Tobacco abuse 1/2 ppd -  Nicotine patch  -  Counseled  cessation   CKD, cannot rule out Acute Kidney Injury,  due to lack of evidence, unknown baseline  currently at 1.3,   IVF Strict I/O and daily weights  Follow CMET in am    Hypomagnesemia/ Hypokalemia generalized   K 2.9 , Magnesium level of 1.5.  Received  IV magnesium in the ER, EKG without ACS   repeat magnesium and K   level in a.m.   DVT prophylaxis: Heparin   Code Status:   Full     Family Communication:  Discussed with patient  Disposition Plan: Expect patient to be discharged to home after condition improves Consults called:    None Admission status:   SDU    [facility] E, PA-C [facility]   If 7PM-7AM, please contact night-coverage [email or web address] Password [facility]  09/07/2015, 3:03 PM

## 2015-09-07 NOTE — Discharge Instructions (Signed)
Your kidney tests today are not normal. You need to drink plenty of water and have this rechecked by your primary care doctor in the next week. Do not take any NSAIDs (motrin, ibuprofen, advil, aleve, excedrin, aspirin, naproxen, goody's powder,  etc.) because they can further hurt your kidneys.  ° °Please follow with your primary care doctor in the next 2 days for a check-up. They must obtain records for further management.  ° °Do not hesitate to return to the Emergency Department for any new, worsening or concerning symptoms.  ° °

## 2015-09-07 NOTE — ED Provider Notes (Signed)
CSN: [id number]     Arrival date & time 09/07/15  0506 History   First MD Initiated Contact with Patient 09/07/15 [id number]     Chief Complaint  Patient presents with  . Dizziness  . Emesis  . Hypertension     (Consider location/radiation/quality/duration/timing/severity/associated sxs/prior Treatment) HPI  Blood pressure 149/130, pulse 65, temperature 97.4 F (36.3 C), temperature source Oral, resp. rate 20, SpO2 97 %.  ANTHONNY Mendez is a 55 y.o. male past medical history significant for hypertension, alcohol use complaining of multiple episodes of emesis onset approximately 4 days ago significant epigastric pain and no diarrhea, fever, chills, chest pain, shortness of breath. Patient is passing flatus normally, he hasn't had his hypertension medication 2 weeks because he needs a refill on his to make an appointment with his primary care doctor. No history of liver issues as per patient. He denies chest pain, shortness of breath, headache, change in vision, unilateral weakness, dysarthria, ataxia. No history of DTs or anxiety when he stops drinking, he states his last drink was 4 days ago. Normally formed, non-melanotic stool yesterday.  Past Medical History  Diagnosis Date  . Hypertension   . Syncope and collapse   . Dizziness and giddiness   . Shortness of breath   . Unspecified essential hypertension    History reviewed. No pertinent past surgical history. No family history on file. Social History  Substance Use Topics  . Smoking status: Former Games developer  . Smokeless tobacco: None  . Alcohol Use: Yes     Comment: Smells of ETOH  St's just stopped drinking    Review of Systems  10 systems reviewed and found to be negative, except as noted in the HPI.   Allergies  Review of patient's allergies indicates no known allergies.  Home Medications   Prior to Admission medications   Medication Sig Start Date End Date Taking? Authorizing Provider  amLODipine (NORVASC) 5 MG  tablet Take 1 tablet (5 mg total) by mouth daily. 09/07/15   Dusan Lipford, PA-C  magnesium oxide (MAG-OX) 400 MG tablet Take 1 tablet (400 mg total) by mouth 2 (two) times daily. 09/07/15   Nidia Grogan, PA-C  potassium chloride SA (K-DUR,KLOR-CON) 20 MEQ tablet Take 1 tablet (20 mEq total) by mouth daily. 09/07/15   Lissette Schenk, PA-C  promethazine (PHENERGAN) 25 MG tablet Take 1 tablet (25 mg total) by mouth every 6 (six) hours as needed for nausea or vomiting. 09/07/15   Joni Reining Cristan Scherzer, PA-C  traMADol (ULTRAM) 50 MG tablet Take 1 tablet (50 mg total) by mouth every 6 (six) hours as needed. Patient not taking: Reported on 09/07/2015 09/13/13   Linwood Dibbles, MD   BP 170/116 mmHg  Pulse 64  Temp(Src) 97.4 F (36.3 C) (Oral)  Resp 17  SpO2 94% Physical Exam  Constitutional: He is oriented to person, place, and time. He appears well-developed and well-nourished. No distress.  Actively vomiting  HENT:  Head: Normocephalic and atraumatic.  Mouth/Throat: Oropharynx is clear and moist.  Eyes: Conjunctivae and EOM are normal. Pupils are equal, round, and reactive to light.  Neck: Normal range of motion.  Cardiovascular: Normal rate, regular rhythm and intact distal pulses.   Pulmonary/Chest: Effort normal and breath sounds normal.  Abdominal: Soft. Bowel sounds are normal. He exhibits no distension and no mass. There is tenderness. There is no rebound and no guarding.  Normo active bowel sounds, mild tenderness to palpation of the epigastrium without guarding or rebound  Musculoskeletal: Normal range of  motion.  Neurological: He is alert and oriented to person, place, and time.  Skin: He is not diaphoretic.  Psychiatric: He has a normal mood and affect.  Nursing note and vitals reviewed.   ED Course  Procedures (including critical care time) Labs Review Labs Reviewed  COMPREHENSIVE METABOLIC PANEL - Abnormal; Notable for the following:    Sodium 129 (*)    Potassium 2.9 (*)     Chloride 89 (*)    Creatinine, Ser 1.30 (*)    Total Protein 8.7 (*)    AST 150 (*)    ALT 90 (*)    Total Bilirubin 1.9 (*)    All other components within normal limits  MAGNESIUM - Abnormal; Notable for the following:    Magnesium 1.5 (*)    All other components within normal limits  URINALYSIS, ROUTINE W REFLEX MICROSCOPIC (NOT AT [facility]) - Abnormal; Notable for the following:    Color, Urine AMBER (*)    Bilirubin Urine SMALL (*)    Ketones, ur 15 (*)    Protein, ur 100 (*)    All other components within normal limits  URINE MICROSCOPIC-ADD ON - Abnormal; Notable for the following:    Squamous Epithelial / LPF 0-5 (*)    Bacteria, UA RARE (*)    All other components within normal limits  CBC WITH DIFFERENTIAL/PLATELET  LIPASE, BLOOD  TROPONIN I  ETHANOL  OCCULT BLOOD GASTRIC / DUODENUM (SPECIMEN CUP)    Imaging Review Dg Chest 2 View  09/07/2015  CLINICAL DATA:  Dizziness and hypertension EXAM: CHEST  2 VIEW COMPARISON:  09/13/2013 FINDINGS: Cardiac shadow is within normal limits. The lungs are well aerated bilaterally. Old rib fractures with healing are noted again on the left. Mild scarring is again seen in the lingula. No acute abnormality noted. IMPRESSION: Chronic changes without acute abnormality. Electronically Signed   By: Alcide CleverMark  Lukens M.D.   On: 09/07/2015 06:45   Ct Abdomen Pelvis W Contrast  09/07/2015  CLINICAL DATA:  Abdominal pain with nausea and vomiting for 4 days EXAM: CT ABDOMEN AND PELVIS WITH CONTRAST TECHNIQUE: Multidetector CT imaging of the abdomen and pelvis was performed using the standard protocol following bolus administration of intravenous contrast. CONTRAST:  75 mL ISOVUE-300 IOPAMIDOL (ISOVUE-300) INJECTION 61% COMPARISON:  None. FINDINGS: Lower chest: There is scarring in the anterior left lung base. There is no lung base edema or consolidation. There are scattered foci of coronary artery calcification. Hepatobiliary: There is a 7 mm cyst near the  dome of the liver on the right anteriorly. No other focal liver lesion is evident on this study. There is cholelithiasis. Gallbladder wall is not appreciably thickened. There is no biliary duct dilatation. Pancreas: There is no pancreatic mass or inflammatory focus. Pancreatic duct is upper normal in size in the head and body region. No lesion within the pancreatic duct is seen by CT. Spleen: No splenic lesions are evident. Adrenals/Urinary Tract: Adrenals appear normal bilaterally. There is no appreciable renal mass or hydronephrosis. There is no renal or ureteral calculus on either side. Urinary bladder is midline with wall thickness within normal limits. Stomach/Bowel: There are scattered sigmoid diverticula without diverticulitis. Occasional colonic diverticula are noted elsewhere in the colon without inflammation. There is no appreciable bowel wall or mesenteric thickening. There is no bowel obstruction. No free air or portal venous air. Vascular/Lymphatic: There is atherosclerotic calcification throughout the mid the distal aorta and common iliac arteries bilaterally. There is moderate calcification in the proximal superior  mesenteric artery without high-grade obstruction seen. No vascular lesions are evident beyond areas of calcification. There is no adenopathy in the abdomen or pelvis by size criteria. There are scattered subcentimeter periportal lymph nodes. Reproductive: Prostate and seminal vesicles appear normal in size and contour. There is no pelvic mass or pelvic fluid collection. Other: Appendix appears unremarkable. There is no abscess or ascites in the abdomen or pelvis. There is fat in the left inguinal ring. Musculoskeletal: There is vacuum phenomenon in the L4-5 disc. There are areas of degenerative type change in the lumbar spine. There is evidence of an old healed rib fracture on the left laterally. There are no blastic or lytic bone lesions. 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 There is also osteoarthritic change in both hip joints. There is no intramuscular or abdominal wall lesion. IMPRESSION: Cholelithiasis.  Gallbladder wall does not appear thickened. There are colonic diverticula without diverticulitis. No bowel obstruction. No abscess. Appendix appears normal. No renal or ureteral calculus.  No hydronephrosis. There are scattered foci of coronary artery calcification. There is calcification at multiple sites in the aorta and common iliac arteries. Electronically Signed   By: Bretta Bang III M.D.   On: 09/07/2015 09:01   I have personally reviewed and evaluated these images and lab results as part of my medical decision-making.   EKG Interpretation None      MDM   Final diagnoses:  Non-intractable vomiting with nausea, vomiting of unspecified type  Hypokalemia  Hypomagnesemia  AKI (acute kidney injury) (HCC)  Uncontrolled hypertension    Filed Vitals:   09/07/15 0856 09/07/15 0915 09/07/15 0930 09/07/15 1000  BP: 182/106 166/107 157/112 170/116  Pulse: 63 63 61 64  Temp:      TempSrc:      Resp: 16 14 10 17   SpO2: 97% 93% 96% 94%    Medications  magnesium oxide (MAG-OX) tablet 400 mg (not administered)  potassium chloride SA (K-DUR,KLOR-CON) CR tablet 40 mEq (not administered)  amLODipine (NORVASC) tablet 5 mg (not administered)  sodium chloride 0.9 % bolus 1,000 mL (1,000 mLs Intravenous New Bag/Given 09/07/15 0739)  ondansetron (ZOFRAN) injection 4 mg (4 mg Intravenous Given 09/07/15 0738)  famotidine (PEPCID) IVPB 20 mg premix (0 mg Intravenous Stopped 09/07/15 0856)  morphine 4 MG/ML injection 4 mg (4 mg Intravenous Given 09/07/15 0738)  iopamidol (ISOVUE-300) 61 % injection (80 mLs Intravenous Contrast Given 09/07/15 0815)    Austin Mendez is 55 y.o. male presenting with Nausea and vomiting onset several days ago after patient stopped drinking. He also has some diffuse abdominal pain, abdominal exam is nonsurgical  with no focal issues. No peritoneal signs. Hydrate, check basic lab work, although my abdominal exam is reassuring based on the severity of his reported pain will obtain CT. Patient is calm with no hand or tongue fasciculations, I don't think he is actively withdrawing from alcohol, he's had no prior history of DTs or anxiety when doesn't drink.  Patient has a hypokalemia at 2.9, acute kidney injury with a creatinine of 1.3, AST is 150 and ALT is 90. T bili very mildly elevated at 1.9. Magnesium is also low at 1.5. Reassuringly, he has no leukocytosis. CT with no acute abnormalities. Cholelithiasis without wall thickening.  Pharmacy tech has had an extensive discussion with patient and made several phone calls to pharmacies on [facility] and they state that they haven't had any prescriptions filled for him in the last year. I will start him  on Norvasc 5 mg.  Pt has tolerated oral fluids, repeat abdominal exam is benign, I've gone over multiple times the patient has to make an appointment with Dr. Ronne Binning, he verbalized back to me that he will call him today and teach back fashion.   Evaluation does not show pathology that would require ongoing emergent intervention or inpatient treatment. Pt is hemodynamically stable and mentating appropriately. Discussed findings and plan with patient/guardian, who agrees with care plan. All questions answered. Return precautions discussed and outpatient follow up given.   New Prescriptions   AMLODIPINE (NORVASC) 5 MG TABLET    Take 1 tablet (5 mg total) by mouth daily.   MAGNESIUM OXIDE (MAG-OX) 400 MG TABLET    Take 1 tablet (400 mg total) by mouth 2 (two) times daily.   POTASSIUM CHLORIDE SA (K-DUR,KLOR-CON) 20 MEQ TABLET    Take 1 tablet (20 mEq total) by mouth daily.   PROMETHAZINE (PHENERGAN) 25 MG TABLET    Take 1 tablet (25 mg total) by mouth every 6 (six) hours as needed for nausea or vomiting.         Wynetta Emery, PA-C 09/07/15  1108  Patient was going to the bathroom and had to himself to the wall and be felt to the floor, states he feels extremely lightheaded.  CT head negative, full neuro exam is without focal abnormality.   Test of ambulation, patient states he is very unsteady, like he is drunk.   Case stressed with triad hospitalist APP [facility], who will evaluate the patient and put in holding orders.  Wynetta Emery, PA-C 09/07/15 1350  Raeford Razor, MD 09/14/15 2207

## 2015-09-07 NOTE — ED Notes (Signed)
Patient was attempting to ambulate down the hallway when he complained of felling dizzy, pt was immediately sat down in a chair and transported back to stretcher via wheelchair. PA PIsciotta at bedside

## 2015-09-07 NOTE — ED Notes (Signed)
PA Pisciotta requesting that patient be ambulated in the hall at this time

## 2015-09-07 NOTE — Progress Notes (Signed)
Pt continues to be restless. Ambulated in hall 500 ft, standby assist, unsteady gate. 2 mg of ativan given, no relief. Pt states wanting to leave "and then he'll come back". Dr. Konrad DoloresMerrell made aware.

## 2015-09-07 NOTE — Progress Notes (Deleted)
Report attempted to [facility]. number left with secretary.  

## 2015-09-07 NOTE — Progress Notes (Signed)
pts BP 204/89. PRN hydralazine not in time frame. Dr. Konrad DoloresMerrell paged.

## 2015-09-07 NOTE — ED Provider Notes (Signed)
ED ECG REPORT   Date: 09/07/2015 0602am  Rate: 70  Rhythm: normal sinus rhythm  QRS Axis: normal  Intervals: normal  ST/T Wave abnormalities: normal  Conduction Disutrbances:none  Narrative Interpretation:   Old EKG Reviewed: unchanged  I have personally reviewed the EKG tracing and agree with the computerized printout as noted.   Zadie Rhineonald Lilliann Rossetti, MD 09/07/15 [phone]

## 2015-09-07 NOTE — ED Notes (Signed)
Patient became very dizzy during ambulation and walked back to room

## 2015-09-08 DIAGNOSIS — F1029 Alcohol dependence with unspecified alcohol-induced disorder: Secondary | ICD-10-CM

## 2015-09-08 DIAGNOSIS — F10231 Alcohol dependence with withdrawal delirium: Principal | ICD-10-CM

## 2015-09-08 DIAGNOSIS — I10 Essential (primary) hypertension: Secondary | ICD-10-CM

## 2015-09-08 DIAGNOSIS — G9340 Encephalopathy, unspecified: Secondary | ICD-10-CM

## 2015-09-08 LAB — COMPREHENSIVE METABOLIC PANEL
ALK PHOS: 75 U/L (ref 38–126)
ALT: 71 U/L — AB (ref 17–63)
AST: 108 U/L — AB (ref 15–41)
Albumin: 2.9 g/dL — ABNORMAL LOW (ref 3.5–5.0)
Anion gap: 7 (ref 5–15)
BILIRUBIN TOTAL: 0.7 mg/dL (ref 0.3–1.2)
BUN: 16 mg/dL (ref 6–20)
CHLORIDE: 100 mmol/L — AB (ref 101–111)
CO2: 28 mmol/L (ref 22–32)
CREATININE: 1.49 mg/dL — AB (ref 0.61–1.24)
Calcium: 9.3 mg/dL (ref 8.9–10.3)
GFR calc Af Amer: 59 mL/min — ABNORMAL LOW (ref >60)
GFR, EST NON AFRICAN AMERICAN: 51 mL/min — AB (ref >60)
Glucose, Bld: 98 mg/dL (ref 65–99)
Potassium: 3.4 mmol/L — ABNORMAL LOW (ref 3.5–5.1)
Sodium: 135 mmol/L (ref 135–145)
TOTAL PROTEIN: 7 g/dL (ref 6.5–8.1)

## 2015-09-08 LAB — BASIC METABOLIC PANEL
Anion gap: 7 (ref 5–15)
BUN: 16 mg/dL (ref 6–20)
CALCIUM: 9.3 mg/dL (ref 8.9–10.3)
CO2: 28 mmol/L (ref 22–32)
CREATININE: 1.46 mg/dL — AB (ref 0.61–1.24)
Chloride: 100 mmol/L — ABNORMAL LOW (ref 101–111)
GFR calc non Af Amer: 52 mL/min — ABNORMAL LOW (ref >60)
Glucose, Bld: 92 mg/dL (ref 65–99)
Potassium: 3.5 mmol/L (ref 3.5–5.1)
Sodium: 135 mmol/L (ref 135–145)

## 2015-09-08 LAB — GLUCOSE, CAPILLARY: Glucose-Capillary: 113 mg/dL — ABNORMAL HIGH (ref 65–99)

## 2015-09-08 LAB — CBC
HCT: 34.6 % — ABNORMAL LOW (ref 39.0–52.0)
HCT: 37.4 % — ABNORMAL LOW (ref 39.0–52.0)
Hemoglobin: 11.7 g/dL — ABNORMAL LOW (ref 13.0–17.0)
Hemoglobin: 12.8 g/dL — ABNORMAL LOW (ref 13.0–17.0)
MCH: 31.2 pg (ref 26.0–34.0)
MCH: 31.6 pg (ref 26.0–34.0)
MCHC: 33.8 g/dL (ref 30.0–36.0)
MCHC: 34.2 g/dL (ref 30.0–36.0)
MCV: 92.3 fL (ref 78.0–100.0)
MCV: 92.3 fL (ref 78.0–100.0)
PLATELETS: 180 10*3/uL (ref 150–400)
PLATELETS: 198 10*3/uL (ref 150–400)
RBC: 3.75 MIL/uL — ABNORMAL LOW (ref 4.22–5.81)
RBC: 4.05 MIL/uL — AB (ref 4.22–5.81)
RDW: 14.8 % (ref 11.5–15.5)
RDW: 14.8 % (ref 11.5–15.5)
WBC: 3.8 10*3/uL — AB (ref 4.0–10.5)
WBC: 7.8 10*3/uL (ref 4.0–10.5)

## 2015-09-08 LAB — HEMOGLOBIN A1C
Hgb A1c MFr Bld: 4.8 % (ref 4.8–5.6)
Mean Plasma Glucose: 91 mg/dL

## 2015-09-08 LAB — MAGNESIUM: Magnesium: 1.8 mg/dL (ref 1.7–2.4)

## 2015-09-08 MED ORDER — CLONIDINE HCL 0.2 MG/24HR TD PTWK
0.2000 mg | MEDICATED_PATCH | TRANSDERMAL | Status: DC
  Administered 2015-09-08: 0.2 mg via TRANSDERMAL
  Filled 2015-09-08: qty 1

## 2015-09-08 MED ORDER — HALOPERIDOL LACTATE 5 MG/ML IJ SOLN: 5.0000 mg | Freq: Once | INTRAMUSCULAR | Status: DC

## 2015-09-08 MED ORDER — POTASSIUM CHLORIDE CRYS ER 20 MEQ PO TBCR
40.0000 meq | EXTENDED_RELEASE_TABLET | Freq: Once | ORAL | Status: AC
  Administered 2015-09-08: 40 meq via ORAL
  Filled 2015-09-08: qty 2

## 2015-09-08 MED ORDER — ENSURE ENLIVE PO LIQD
237.0000 mL | Freq: Two times a day (BID) | ORAL | Status: DC
  Administered 2015-09-08 – 2015-09-21 (×18): 237 mL via ORAL

## 2015-09-08 MED ORDER — HYDRALAZINE HCL 50 MG PO TABS
50.0000 mg | ORAL_TABLET | Freq: Three times a day (TID) | ORAL | Status: DC
  Administered 2015-09-08: 50 mg via ORAL
  Filled 2015-09-08 (×7): qty 1

## 2015-09-08 MED ORDER — CLONIDINE HCL 0.3 MG/24HR TD PTWK: 0.3000 mg | MEDICATED_PATCH | TRANSDERMAL | Status: DC

## 2015-09-08 MED ORDER — LORAZEPAM 2 MG/ML IJ SOLN
2.0000 mg | Freq: Once | INTRAMUSCULAR | Status: AC
  Administered 2015-09-08: 2 mg via INTRAVENOUS

## 2015-09-08 NOTE — Progress Notes (Signed)
Initial Nutrition Assessment  DOCUMENTATION CODES:   Not applicable  INTERVENTION:  Provide Ensure Enlive po BID, each supplement provides 350 kcal and 20 grams of protein.  Monitor magnesium, potassium, and phosphorus daily for at least 3 days, MD to replete as needed, as pt is at risk for refeeding syndrome given ETOH abuse.  Encourage adequate PO intake.   NUTRITION DIAGNOSIS:   Increased nutrient needs related to acute illness as evidenced by estimated needs.  GOAL:   Patient will meet greater than or equal to 90% of their needs  MONITOR:   PO intake, Supplement acceptance, Weight trends, Labs, I & O's  REASON FOR ASSESSMENT:   Consult Poor PO  ASSESSMENT:   55 y.o. male with medical history significant for poorly controlled HTN, ETOH abuse, presenting to to the ED with nausea and vomiting, diziness with onset about 4 days ago, accompanied by mild epigastric pain without diarrhea,.   Safety sitter at bedside. Pt was difficult to understand as pt would make moaning sounds to answer questions. No family at bedside. RD unable to obtain nutrition history. Meal completion has been 25-100%. Sitter at bedside reports the pt only letting her feed him a couple of bites of food at breakfast. Noted pt with restraints on. Meal completion at lunch today was 100% however. RD to order nutritional supplements to aid in caloric and protein needs.   Nutrition-Focused physical exam completed. Findings are no fat depletion, moderate muscle depletion, and no edema.   Labs and medications reviewed. Potassium and magnesium low. MD aware and repleating.  Diet Order:  Diet Heart Room service appropriate?: Yes; Fluid consistency:: Thin  Skin:  Reviewed, no issues  Last BM:  Unknown  Height:   Ht Readings from Last 1 Encounters:  09/07/15 5\' 6"  (1.676 m)    Weight:   Wt Readings from Last 1 Encounters:  09/08/15 133 lb 1.6 oz (60.374 kg)    Ideal Body Weight:  64.5 kg  BMI:   Body mass index is 21.49 kg/(m^2).  Estimated Nutritional Needs:   Kcal:  1800-2000  Protein:  80-90 grams  Fluid:  1.8 - 2 L/day  EDUCATION NEEDS:   No education needs identified at this time  Roslyn SmilingStephanie Arek Spadafore, MS, RD, LDN Pager # [phone] After hours/ weekend pager # [phone]

## 2015-09-08 NOTE — Progress Notes (Signed)
PROGRESS NOTE    Austin Mendez  ZOX:[id number] DOB: 1960-10-27 DOA: 09/07/2015 PCP: Billee Cashing, MD    Brief Narrative: Austin Mendez is a 55 y.o. male with a Past Medical History of EtOH abuse, uncontrolled hypertension, medical noncompliance, presenting with complaints of dizziness, nausea,. He was found to be in hypertensive urgency, alcohol withdrawal.   Assessment & Plan:   Principal Problem:   Malignant hypertension Active Problems:   Alcohol abuse   Essential hypertension   Syncope and collapse   Hypokalemia   Acute kidney injury (HCC)   Hypomagnesemia   Epigastric pain   ETOH abuse  Malignant Hypertension: - admitted to step down and started on IV hydralazine.    Alcohol Withdrawal: On CIWA , received about 16 mg of ativan,s till very agitated, trashing his legs and arms. Will need transfer to ICU for precedex drip.    Hypokalemia/ Hypomagnesemia: Replete as needed. Repeat tonight.    Elevated liver function tests: Probably alcohol induced hepatitis.   Acute Kidney Injury: Probably pre renal/ hydrate and repeat renal parameters.    Presyncope: Unclear etiology. Malignant hypertension vs alcohol abuse vs dehydration.  Continue to monitor.  Fall precautions.       DVT prophylaxis: (Lovenox/ Code Status: presumed full code.  Family Communication: none at bedside.  Disposition Plan: (transfer to ICU.    Consultants:   PCCM for transfer to ICU for precedex drip.    Procedures: none   Antimicrobials: none    Subjective: Confused.   Objective: Filed Vitals:   09/08/15 1005 09/08/15 1155 09/08/15 1350 09/08/15 1430  BP: 164/104 188/112 195/111 207/114  Pulse: 86  97   Temp: 97.9 F (36.6 C) 98.1 F (36.7 C) 98.8 F (37.1 C)   TempSrc: Oral Oral Oral   Resp: Height:      Weight:      SpO2: 94% 94% 95%     Intake/Output Summary (Last 24 hours) at 09/08/15 1442 Last data filed at 09/08/15 1301  Gross  per 24 hour  Intake 731.67 ml  Output    300 ml  Net 431.67 ml   Filed Weights   09/07/15 1700 09/08/15 0403  Weight: 61.326 kg (135 lb 3.2 oz) 60.374 kg (133 lb 1.6 oz)    Examination:  General exam: Agitated and trashing his legs and arms. Confused, delirious.   Respiratory system: Clear to auscultation. Respiratory effort normal. Cardiovascular system: tachycardic, no pedal edema.  Gastrointestinal system: Abdomen is nondistended, soft and nontender. No organomegaly or masses felt. Normal bowel sounds heard. Central nervous system: Alert and delirious.  Extremities: Symmetric 5 x 5 power. Skin: No rashes, lesions or ulcers Psychiatry: confused, delirious, on restraints.     Data Reviewed: I have personally reviewed following labs and imaging studies  CBC:  Recent Labs Lab 09/07/15 0600 09/08/15 0400  WBC 5.5 3.8*  NEUTROABS 3.3  --   HGB 14.6 11.7*  HCT 41.4 34.6*  MCV 90.8 92.3  PLT 221 180   Basic Metabolic Panel:  Recent Labs Lab 09/07/15 0600 09/07/15 0722 09/08/15 0400  NA 129*  --  135  K 2.9*  --  3.4*  CL 89*  --  100*  CO2 28  --  28  GLUCOSE 90  --  98  BUN 11  --  16  CREATININE 1.30*  --  1.49*  CALCIUM 9.7  --  9.3  MG  --  1.5*  --    GFR:  Estimated Creatinine Clearance: 47.9 mL/min (by C-G formula based on Cr of 1.49). Liver Function Tests:  Recent Labs Lab 09/07/15 0600 09/08/15 0400  AST 150* 108*  ALT 90* 71*  ALKPHOS 102 75  BILITOT 1.9* 0.7  PROT 8.7* 7.0  ALBUMIN 3.6 2.9*    Recent Labs Lab 09/07/15 0600  LIPASE 25   No results for input(s): AMMONIA in the last 168 hours. Coagulation Profile:  Recent Labs Lab 09/07/15 1456  INR 1.12   Cardiac Enzymes:  Recent Labs Lab 09/07/15 0600  TROPONINI <0.03   BNP (last 3 results) No results for input(s): PROBNP in the last 8760 hours. HbA1C:  Recent Labs  09/07/15 1456  HGBA1C 4.8   CBG: No results for input(s): GLUCAP in the last 168 hours. Lipid  Profile: No results for input(s): CHOL, HDL, LDLCALC, TRIG, CHOLHDL, LDLDIRECT in the last 72 hours. Thyroid Function Tests: No results for input(s): TSH, T4TOTAL, FREET4, T3FREE, THYROIDAB in the last 72 hours. Anemia Panel: No results for input(s): VITAMINB12, FOLATE, FERRITIN, TIBC, IRON, RETICCTPCT in the last 72 hours. Sepsis Labs: No results for input(s): PROCALCITON, LATICACIDVEN in the last 168 hours.  Recent Results (from the past 240 hour(s))  MRSA PCR Screening     Status: None   Collection Time: 09/07/15  5:12 PM  Result Value Ref Range Status   MRSA by PCR NEGATIVE NEGATIVE Final    Comment:        The GeneXpert MRSA Assay (FDA approved for NASAL specimens only), is one component of a comprehensive MRSA colonization surveillance program. It is not intended to diagnose MRSA infection nor to guide or monitor treatment for MRSA infections.          Radiology Studies: Dg Chest 2 View  09/07/2015  CLINICAL DATA:  Dizziness and hypertension EXAM: CHEST  2 VIEW COMPARISON:  09/13/2013 FINDINGS: Cardiac shadow is within normal limits. The lungs are well aerated bilaterally. Old rib fractures with healing are noted again on the left. Mild scarring is again seen in the lingula. No acute abnormality noted. IMPRESSION: Chronic changes without acute abnormality. Electronically Signed   By: Alcide CleverMark  Lukens M.D.   On: 09/07/2015 06:45   Ct Head Wo Contrast  09/07/2015  CLINICAL DATA:  Dizziness and syncope EXAM: CT HEAD WITHOUT CONTRAST TECHNIQUE: Contiguous axial images were obtained from the base of the skull through the vertex without intravenous contrast. COMPARISON:  April 28, 2013 FINDINGS: There is mild diffuse atrophy. There is no intracranial mass, hemorrhage, extra-axial fluid collection, or midline shift. There is small vessel disease throughout the centra semiovale bilaterally. There is small vessel disease in the left thalamus as well. No acute infarct evident. The knee  calvarium appears intact. Visualized mastoid air cells are clear. No intraorbital lesions are evident. 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 No acute infarct evident. No hemorrhage or mass effect. Electronically Signed   By: Bretta BangWilliam  Woodruff III M.D.   On: 09/07/2015 12:43   Ct Abdomen Pelvis W Contrast  09/07/2015  CLINICAL DATA:  Abdominal pain with nausea and vomiting for 4 days EXAM: CT ABDOMEN AND PELVIS WITH CONTRAST TECHNIQUE: Multidetector CT imaging of the abdomen and pelvis was performed using the standard protocol following bolus administration of intravenous contrast. CONTRAST:  75 mL ISOVUE-300 IOPAMIDOL (ISOVUE-300) INJECTION 61% COMPARISON:  None. FINDINGS: Lower chest: There is scarring in the anterior left lung base. There is no lung base edema or consolidation. There are scattered foci of  coronary artery calcification. Hepatobiliary: There is a 7 mm cyst near the dome of the liver on the right anteriorly. No other focal liver lesion is evident on this study. There is cholelithiasis. Gallbladder wall is not appreciably thickened. There is no biliary duct dilatation. Pancreas: There is no pancreatic mass or inflammatory focus. Pancreatic duct is upper normal in size in the head and body region. No lesion within the pancreatic duct is seen by CT. Spleen: No splenic lesions are evident. Adrenals/Urinary Tract: Adrenals appear normal bilaterally. There is no appreciable renal mass or hydronephrosis. There is no renal or ureteral calculus on either side. Urinary bladder is midline with wall thickness within normal limits. Stomach/Bowel: There are scattered sigmoid diverticula without diverticulitis. Occasional colonic diverticula are noted elsewhere in the colon without inflammation. There is no appreciable bowel wall or mesenteric thickening. There is no bowel obstruction. No free air or portal venous air. Vascular/Lymphatic: There is  atherosclerotic calcification throughout the mid the distal aorta and common iliac arteries bilaterally. There is moderate calcification in the proximal superior mesenteric artery without high-grade obstruction seen. No vascular lesions are evident beyond areas of calcification. There is no adenopathy in the abdomen or pelvis by size criteria. There are scattered subcentimeter periportal lymph nodes. Reproductive: Prostate and seminal vesicles appear normal in size and contour. There is no pelvic mass or pelvic fluid collection. Other: Appendix appears unremarkable. There is no abscess or ascites in the abdomen or pelvis. There is fat in the left inguinal ring. Musculoskeletal: There is vacuum phenomenon in the L4-5 disc. There are areas of degenerative type change in the lumbar spine. There is evidence of an old healed rib fracture on the left laterally. There are no blastic or lytic bone lesions. There is osteoarthritic change in the right sacroiliac joint. There is also osteoarthritic change in both hip joints. There is no intramuscular or abdominal wall lesion. IMPRESSION: Cholelithiasis.  Gallbladder wall does not appear thickened. There are colonic diverticula without diverticulitis. No bowel obstruction. No abscess. Appendix appears normal. No renal or ureteral calculus.  No hydronephrosis. There are scattered foci of coronary artery calcification. There is calcification at multiple sites in the aorta and common iliac arteries. Electronically Signed   By: Bretta Bang III M.D.   On: 09/07/2015 09:01        Scheduled Meds: . feeding supplement (ENSURE ENLIVE)  237 mL Oral BID BM  . folic acid  1 mg Oral Daily  . heparin  5,000 Units Subcutaneous Q8H  . hydrALAZINE  50 mg Oral Q8H  . multivitamin with minerals  1 tablet Oral Daily  . nicotine  21 mg Transdermal Daily  . pneumococcal 23 valent vaccine  0.5 mL Intramuscular Tomorrow-1000  . thiamine  100 mg Oral Daily   Continuous Infusions:     LOS: 1 day    Time spent: 25 minutes.     Kathlen Mody, MD [facility] Pager [phone]  If 7PM-7AM, please contact night-coverage [email or web address] Password [email or web address] 09/08/2015, 2:42 PM

## 2015-09-08 NOTE — Progress Notes (Signed)
 2mg  IV Ativan given per MD order. Verbal order to give another 2mg  in 5 minutes if first dose ineffective.

## 2015-09-08 NOTE — Progress Notes (Signed)
Patient has been restless and agitated since the beginning of the shift. He has been wanting to get out of bed and was combative. CIWA score at 20:00 was 15 and at midnight 19.  Patient has received a total of 8 mg of Ativan since 19:33 and has not been effective. Safety sitter at bedside, but she is unable to stop patient from climbing out of bed. Order received for restraints. Will continue to monitor.

## 2015-09-08 NOTE — Consult Note (Signed)
Name: Austin RoughWilliam D Mendez MRN: [id number] DOB: 1960-10-13    ADMISSION DATE:  09/07/2015 CONSULTATION DATE:  6/17  REFERRING MD :  Blake DivineAkula   CHIEF COMPLAINT:  Agitated delirium/ ETOH w/d   BRIEF PATIENT DESCRIPTION:  55 y.o. male with medical history significant for poorly controlled HTN, ETOH abuse. Admitted on 6/15 w/ ETOH w/d, dehydration and HTN urgency.   SIGNIFICANT EVENTS    STUDIES:   6/16 CT head: Atrophy with extensive supratentorial small vessel disease, not significantly changed. No acute infarct evident.  HISTORY OF PRESENT ILLNESS:   55 y.o. male with medical history significant for poorly controlled HTN, ETOH abuse, presenting to to the ED on 6/16 with 4 d h/o nausea and vomiting, diziness,.   No history of withrawals in the past. He was dehydrated, given IVF in the Er. He also was hypokalemic, hypomagnesemic and his BBP was very elevated requiring IV antihypertensive. At the ED, electrolytes and IVF were replenished. He was to ambulate to the bathroom, But became very dizzy for which it felt prudent to admit management of symptoms. He was admitted to the SDU setting. Became progressively restless and agitated. CIWA protocol started for w/d. He had received total of 16 mg ativan, then 12 mg the following day, and on 6/17 only 4 mg. PCCM was asked to see for agitation & concern for w/d  PAST MEDICAL HISTORY :   has a past medical history of Hypertension; Syncope and collapse; Dizziness and giddiness; Shortness of breath; and Unspecified essential hypertension.  has no past surgical history on file. Prior to Admission medications   Medication Sig Start Date End Date Taking? Authorizing Provider  amLODipine (NORVASC) 5 MG tablet Take 1 tablet (5 mg total) by mouth daily. 09/07/15   Nicole Pisciotta, PA-C  magnesium oxide (MAG-OX) 400 MG tablet Take 1 tablet (400 mg total) by mouth 2 (two) times daily. 09/07/15   Nicole Pisciotta, PA-C  potassium chloride SA  (K-DUR,KLOR-CON) 20 MEQ tablet Take 1 tablet (20 mEq total) by mouth daily. 09/07/15   Nicole Pisciotta, PA-C  promethazine (PHENERGAN) 25 MG tablet Take 1 tablet (25 mg total) by mouth every 6 (six) hours as needed for nausea or vomiting. 09/07/15   Joni ReiningNicole Pisciotta, PA-C  traMADol (ULTRAM) 50 MG tablet Take 1 tablet (50 mg total) by mouth every 6 (six) hours as needed. Patient not taking: Reported on 09/07/2015 09/13/13   Linwood DibblesJon Knapp, MD   No Known Allergies  FAMILY HISTORY:  family history includes Cancer in his mother; Diabetes in his father. SOCIAL HISTORY:  reports that he has been smoking Cigarettes.  He has been smoking about 10.00 packs per day. He does not have any smokeless tobacco history on file. He reports that he drinks alcohol. He reports that he uses illicit drugs (Marijuana).  REVIEW OF SYSTEMS:   Unable d/t agitation   SUBJECTIVE:   VITAL SIGNS: Temp:  [97.5 F (36.4 C)-99.1 F (37.3 C)] 97.8 F (36.6 C) (06/17 1632) Pulse Rate:  [62-125] 125 (06/17 1602) Resp:  [16-18] 18 (06/17 1602) BP: (123-207)/(83-116) 193/116 mmHg (06/17 1602) SpO2:  [93 %-100 %] 95 % (06/17 1350) Weight:  [133 lb 1.6 oz (60.374 kg)-135 lb 3.2 oz (61.326 kg)] 133 lb 1.6 oz (60.374 kg) (06/17 0403)  PHYSICAL EXAMINATION: General:  55 year old male, awake, restless. Sp slurred  Neuro:  Confused, moves all ext. Sp slurred.  HEENT:  Poor dentition, MMM. No JVD  Cardiovascular:  RRR, tachy rrr  Lungs:  Scattered  rhonchi no accessory use  Abdomen:  Soft, not tender + bowel sounds  Musculoskeletal:  Equal st/bulk  Skin:  Soft, not tender    Recent Labs Lab 09/07/15 0600 09/08/15 0400  NA 129* 135  K 2.9* 3.4*  CL 89* 100*  CO2 28 28  BUN 11 16  CREATININE 1.30* 1.49*  GLUCOSE 90 98    Recent Labs Lab 09/07/15 0600 09/08/15 0400  HGB 14.6 11.7*  HCT 41.4 34.6*  WBC 5.5 3.8*  PLT 221 180   Dg Chest 2 View  09/07/2015  CLINICAL DATA:  Dizziness and hypertension EXAM: CHEST  2  VIEW COMPARISON:  09/13/2013 FINDINGS: Cardiac shadow is within normal limits. The lungs are well aerated bilaterally. Old rib fractures with healing are noted again on the left. Mild scarring is again seen in the lingula. No acute abnormality noted. IMPRESSION: Chronic changes without acute abnormality. Electronically Signed   By: Alcide Clever M.D.   On: 09/07/2015 06:45   Ct Head Wo Contrast  09/07/2015  CLINICAL DATA:  Dizziness and syncope EXAM: CT HEAD WITHOUT CONTRAST TECHNIQUE: Contiguous axial images were obtained from the base of the skull through the vertex without intravenous contrast. COMPARISON:  April 28, 2013 FINDINGS: There is mild diffuse atrophy. There is no intracranial mass, hemorrhage, extra-axial fluid collection, or midline shift. There is small vessel disease throughout the centra semiovale bilaterally. There is small vessel disease in the left thalamus as well. No acute infarct evident. The knee calvarium appears intact. Visualized mastoid air cells are clear. No intraorbital lesions are evident. 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 No acute infarct evident. No hemorrhage or mass effect. Electronically Signed   By: Bretta Bang III M.D.   On: 09/07/2015 12:43   Ct Abdomen Pelvis W Contrast  09/07/2015  CLINICAL DATA:  Abdominal pain with nausea and vomiting for 4 days EXAM: CT ABDOMEN AND PELVIS WITH CONTRAST TECHNIQUE: Multidetector CT imaging of the abdomen and pelvis was performed using the standard protocol following bolus administration of intravenous contrast. CONTRAST:  75 mL ISOVUE-300 IOPAMIDOL (ISOVUE-300) INJECTION 61% COMPARISON:  None. FINDINGS: Lower chest: There is scarring in the anterior left lung base. There is no lung base edema or consolidation. There are scattered foci of coronary artery calcification. Hepatobiliary: There is a 7 mm cyst near the dome of the liver on the right anteriorly. No other  focal liver lesion is evident on this study. There is cholelithiasis. Gallbladder wall is not appreciably thickened. There is no biliary duct dilatation. Pancreas: There is no pancreatic mass or inflammatory focus. Pancreatic duct is upper normal in size in the head and body region. No lesion within the pancreatic duct is seen by CT. Spleen: No splenic lesions are evident. Adrenals/Urinary Tract: Adrenals appear normal bilaterally. There is no appreciable renal mass or hydronephrosis. There is no renal or ureteral calculus on either side. Urinary bladder is midline with wall thickness within normal limits. Stomach/Bowel: There are scattered sigmoid diverticula without diverticulitis. Occasional colonic diverticula are noted elsewhere in the colon without inflammation. There is no appreciable bowel wall or mesenteric thickening. There is no bowel obstruction. No free air or portal venous air. Vascular/Lymphatic: There is atherosclerotic calcification throughout the mid the distal aorta and common iliac arteries bilaterally. There is moderate calcification in the proximal superior mesenteric artery without high-grade obstruction seen. No vascular lesions are evident beyond areas of calcification. There is no adenopathy in the abdomen or pelvis by size  criteria. There are scattered subcentimeter periportal lymph nodes. Reproductive: Prostate and seminal vesicles appear normal in size and contour. There is no pelvic mass or pelvic fluid collection. Other: Appendix appears unremarkable. There is no abscess or ascites in the abdomen or pelvis. There is fat in the left inguinal ring. Musculoskeletal: There is vacuum phenomenon in the L4-5 disc. There are areas of degenerative type change in the lumbar spine. There is evidence of an old healed rib fracture on the left laterally. There are no blastic or lytic bone lesions. There is osteoarthritic change in the right sacroiliac joint. There is also osteoarthritic change in  both hip joints. There is no intramuscular or abdominal wall lesion. IMPRESSION: Cholelithiasis.  Gallbladder wall does not appear thickened. There are colonic diverticula without diverticulitis. No bowel obstruction. No abscess. Appendix appears normal. No renal or ureteral calculus.  No hydronephrosis. There are scattered foci of coronary artery calcification. There is calcification at multiple sites in the aorta and common iliac arteries. Electronically Signed   By: Bretta Bang III M.D.   On: 09/07/2015 09:01    ASSESSMENT / PLAN:  Acute Encephalopathy d/t ETOH w/d; DTs ->has required several doses of ativan over last 48 hrs but had received only 4mg  today. Think still safe to continue Benzos Plan Cont CIWA protocol w/ PRN ativan Add clonidine patch Think he is safe for SDU currently-->might benefit from a sitter.  Cont multi-vit and folate   HTN Plan Add clonidine patch Cont PRN hydralazine   AKI Plan Cont IV hydration  Trend chemistry     We will s/o he can stay in the SDU from our stand-point.   Simonne Martinet ACNP-BC [facility] Pulmonary/Critical Care Pager # [phone] OR # [phone] if no answer   09/08/2015, 4:52 PM  Attending Note:  Patient is a 55 year old alcoholic male who present to the hospital with poorly controlled HTN and etoh abuse who received 16 mg of IV ativan yesterday with adequate control of his symptoms.  Today he received 4 mg only so developed withdrawal.  Patient was transferred to the ICU for a precedex drip.  Patient has been on the CIWA protocol with ativan but evidently has not been given.  Patient was seen in the ICU, was clearly in withdrawal.  Was given 4 mg of IV ativan with significant stabilization of symptoms.  On exam now, tremor is significantly better.  HR down to 100 from 130.  I reviewed CXR myself, no abnormalities.  Discussed with Dr. Blake Divine and will transfer back to the SDU where he can be treated as delineated above.  Etoh  withdrawal: responded very nicely to ativan, protecting airway and stable.  - Ativan PRN per CIWA protocol.  - Clonidine 0.2 patch for alpha 2 effect.  Wellsite geologist.  - May transfer back to SDU, if airway protection becomes an issue please call PCCM back.  - Monitor O2 sat.  Systemic hypertension with crisis:  - Clonidine for dual effect, HTN and withdrawal.  - Tele monitoring.  - Hydralazine.  AMS: due to etoh withdrawal and ativen  - Minimal amount of ativan needed to control withdrawal.  Alcohol dependence:  - Thiamine.  - Folate.  - MVI.  Patient simply needed more ativan, will transfer back to SDU, apply O2 sat monitor to insure patient is doing well from a respiratory standpoint.  PCCM will sign off, please call back if needed.  Patient seen and examined, agree with above note.  I dictated  the care and orders written for this patient under my direction.  Rush Farmer, MD [phone]

## 2015-09-09 DIAGNOSIS — I10 Essential (primary) hypertension: Secondary | ICD-10-CM | POA: Diagnosis present

## 2015-09-09 DIAGNOSIS — E876 Hypokalemia: Secondary | ICD-10-CM

## 2015-09-09 DIAGNOSIS — Z720 Tobacco use: Secondary | ICD-10-CM | POA: Diagnosis present

## 2015-09-09 DIAGNOSIS — Z91199 Patient's noncompliance with other medical treatment and regimen due to unspecified reason: Secondary | ICD-10-CM | POA: Diagnosis present

## 2015-09-09 DIAGNOSIS — Z9119 Patient's noncompliance with other medical treatment and regimen: Secondary | ICD-10-CM | POA: Diagnosis present

## 2015-09-09 LAB — RAPID URINE DRUG SCREEN, HOSP PERFORMED
AMPHETAMINES: NOT DETECTED
BENZODIAZEPINES: POSITIVE — AB
Barbiturates: NOT DETECTED
Cocaine: NOT DETECTED
OPIATES: NOT DETECTED
Tetrahydrocannabinol: NOT DETECTED

## 2015-09-09 LAB — GLUCOSE, CAPILLARY
Glucose-Capillary: 76 mg/dL (ref 65–99)
Glucose-Capillary: 101 mg/dL — ABNORMAL HIGH (ref 65–99)
Glucose-Capillary: 93 mg/dL (ref 65–99)

## 2015-09-09 MED ORDER — LORAZEPAM 2 MG/ML IJ SOLN
4.0000 mg | Freq: Four times a day (QID) | INTRAMUSCULAR | Status: DC
  Administered 2015-09-09 – 2015-09-12 (×5): 4 mg via INTRAVENOUS
  Filled 2015-09-09 (×5): qty 2

## 2015-09-09 MED ORDER — LORAZEPAM 2 MG/ML IJ SOLN
2.0000 mg | Freq: Three times a day (TID) | INTRAMUSCULAR | Status: DC
  Administered 2015-09-09 (×2): 2 mg via INTRAVENOUS
  Filled 2015-09-09 (×2): qty 1

## 2015-09-09 MED ORDER — SODIUM CHLORIDE 0.9 % IV SOLN: INTRAVENOUS | Status: DC

## 2015-09-09 MED ORDER — METOPROLOL TARTRATE 5 MG/5ML IV SOLN
7.5000 mg | Freq: Once | INTRAVENOUS | Status: AC
  Administered 2015-09-09: 7.5 mg via INTRAVENOUS
  Filled 2015-09-09: qty 10

## 2015-09-09 MED ORDER — METOPROLOL TARTRATE 5 MG/5ML IV SOLN
5.0000 mg | Freq: Three times a day (TID) | INTRAVENOUS | Status: DC
  Administered 2015-09-09: 5 mg via INTRAVENOUS
  Filled 2015-09-09 (×3): qty 5

## 2015-09-09 MED ORDER — CHLORHEXIDINE GLUCONATE 0.12 % MT SOLN
15.0000 mL | Freq: Two times a day (BID) | OROMUCOSAL | Status: DC
  Administered 2015-09-09 – 2015-09-21 (×20): 15 mL via OROMUCOSAL
  Filled 2015-09-09 (×22): qty 15

## 2015-09-09 MED ORDER — DEXTROSE-NACL 5-0.9 % IV SOLN
INTRAVENOUS | Status: DC
  Administered 2015-09-09: 15:00:00 via INTRAVENOUS
  Administered 2015-09-10: 100 mL/h via INTRAVENOUS

## 2015-09-09 MED ORDER — METOPROLOL TARTRATE 5 MG/5ML IV SOLN
10.0000 mg | Freq: Three times a day (TID) | INTRAVENOUS | Status: DC
  Administered 2015-09-09 – 2015-09-10 (×2): 10 mg via INTRAVENOUS
  Filled 2015-09-09 (×2): qty 10

## 2015-09-09 MED ORDER — HYDRALAZINE HCL 20 MG/ML IJ SOLN
5.0000 mg | INTRAMUSCULAR | Status: DC | PRN
  Administered 2015-09-09 (×2): 10 mg via INTRAVENOUS
  Administered 2015-09-09: 5 mg via INTRAVENOUS
  Administered 2015-09-09 – 2015-09-10 (×4): 10 mg via INTRAVENOUS
  Filled 2015-09-09 (×7): qty 1

## 2015-09-09 MED ORDER — CETYLPYRIDINIUM CHLORIDE 0.05 % MT LIQD
7.0000 mL | Freq: Two times a day (BID) | OROMUCOSAL | Status: DC
  Administered 2015-09-09 – 2015-09-20 (×16): 7 mL via OROMUCOSAL

## 2015-09-09 NOTE — Progress Notes (Signed)
Paged Dr Joseph ArtWoods and made him aware that pt has needed 20mg  of IV Ativan today. BP currently 211/130 (MAP 154). New orders for 7.5 mg of IV Lopressor x1 now. Will continue to monitor.

## 2015-09-09 NOTE — Progress Notes (Addendum)
PROGRESS NOTE    Austin Mendez  WUJ:[id number] DOB: 10/31/1960 DOA: 09/07/2015 PCP: Billee Cashing, MD   Brief Narrative:  55 y.o. BM PMHx poorly controlled HTN, ETOH abuse, Substance Abuse (at admission admitted to beer 540oz/dy), Syncope and collapse,  Presenting to to the ED on 6/16 with 4 d h/o nausea and vomiting, diziness,.  No history of withrawals in the past. He was dehydrated, given IVF in the Er. He also was hypokalemic, hypomagnesemic and his BBP was very elevated requiring IV antihypertensive. At the ED, electrolytes and IVF were replenished. He was to ambulate to the bathroom, But became very dizzy for which it felt prudent to admit management of symptoms. He was admitted to the SDU setting. Became progressively restless and agitated. CIWA protocol started for w/d. He had received total of 16 mg ativan, then 12 mg the following day, and on 6/17 only 4 mg. PCCM was asked to see for agitation & concern for w/d   Assessment & Plan:   Principal Problem:   Malignant hypertension Active Problems:   Alcohol abuse   Essential hypertension   Syncope and collapse   Hypokalemia   Acute kidney injury (HCC)   Hypomagnesemia   Epigastric pain   ETOH abuse  Malignant Hypertension/Noncompliance medication -Per admitting note BP has been running in the 170's/110's for at least 1 year.  -Troponin negative  -Clonidine patch 0.2 mg q 7 days -Metoprolol IV10 mg TID -Hydralazine PRN    Dizziness with presyncopal episode likely due to dehydration in conjunction with ETOH withdrawal  -CT head negative  -Currently withdrawing not an issue -2 D echo in 2014 CHF with Gr 1 DD;-Echocardiogram pending  Alcohol abuse/Withdrawal  -Per admission note, prior to admission Last drink 4 days ago. - CIWA  Protocol;  -CIWA protocol not adequate by itself add Ativan 4 mg QID scheduled -Thiamine, folate, MVI per protocol -UDS positive benzodiazepine; expected patient on benzos     Tobacco abuse  - Nicotine patch  - Counseled cessation  CKD, +/- Acute Kidney Injury; unknown baseline (admission Cr 1.3)  -Strict I/O -Daily weights Filed Weights   09/07/15 1700 09/08/15 0403  Weight: 61.326 kg (135 lb 3.2 oz) 60.374 kg (133 lb 1.6 oz)   Lab Results  Component Value Date   CREATININE 1.46* 09/08/2015   CREATININE 1.49* 09/08/2015   CREATININE 1.30* 09/07/2015  -D5-NS 100 ml/hr  Hypoglycemia -CBG q 4hr -See CKD  Hypomagnesemia -Resolved  Hypokalemia -Resolved    DVT prophylaxis: Subcutaneous heparin Code Status: Full Family Communication: None Disposition Plan: Resolution alcohol withdrawal   Consultants:  Dr. 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 --No acute infarct evident  Cultures NA  Antimicrobials: NA   Devices NA   LINES / TUBES:  NA    Continuous Infusions:    Subjective: 6/18 A/O 0 patient sleeping difficult to arouse (secondary to benzodiazepine). RN Casimer Lanius states when awake only knows name and birthday.    Objective: Filed Vitals:   09/09/15 0732 09/09/15 0748 09/09/15 0800 09/09/15 0830  BP: 142/85  176/113 176/113  Pulse:   116   Temp:  97.5 F (36.4 C)    TempSrc:  Oral    Resp:   30   Height:      Weight:      SpO2:   92%     Intake/Output Summary (Last 24 hours) at 09/09/15 0847 Last  data filed at 09/09/15 0800  Gross per 24 hour  Intake    620 ml  Output   1050 ml  Net   -430 ml   Filed Weights   09/07/15 1700 09/08/15 0403  Weight: 61.326 kg (135 lb 3.2 oz) 60.374 kg (133 lb 1.6 oz)    Examination:  General: A/O 0, No acute respiratory distress Eyes: negative scleral hemorrhage, negative anisocoria, negative icterus ENT: Negative Runny nose, negative gingival bleeding, Neck:  Negative scars, masses, torticollis, lymphadenopathy, JVD Lungs: Clear to auscultation  bilaterally without wheezes or crackles Cardiovascular: Regular rate and rhythm without murmur gallop or rub normal S1 and S2 Abdomen: negative abdominal pain, nondistended, positive soft, bowel sounds, no rebound, no ascites, no appreciable mass Extremities: No significant cyanosis, clubbing, or edema bilateral lower extremities Skin: Negative rashes, lesions, ulcers Psychiatric: Unable to assess Central nervous system: Unable to assess   .     Data Reviewed: Care during the described time interval was provided by me .  I have reviewed this patient's available data, including medical history, events of note, physical examination, and all test results as part of my evaluation. I have personally reviewed and interpreted all radiology studies.  CBC:  Recent Labs Lab 09/07/15 0600 09/08/15 0400 09/08/15 1641  WBC 5.5 3.8* 7.8  NEUTROABS 3.3  --   --   HGB 14.6 11.7* 12.8*  HCT 41.4 34.6* 37.4*  MCV 90.8 92.3 92.3  PLT 221 180 198   Basic Metabolic Panel:  Recent Labs Lab 09/07/15 0600 09/07/15 0722 09/08/15 0400 09/08/15 1641  NA 129*  --  135 135  K 2.9*  --  3.4* 3.5  CL 89*  --  100* 100*  CO2 28  --  28 28  GLUCOSE 90  --  98 92  BUN 11  --  16 16  CREATININE 1.30*  --  1.49* 1.46*  CALCIUM 9.7  --  9.3 9.3  MG  --  1.5*  --  1.8   GFR: Estimated Creatinine Clearance: 48.8 mL/min (by C-G formula based on Cr of 1.46). Liver Function Tests:  Recent Labs Lab 09/07/15 0600 09/08/15 0400  AST 150* 108*  ALT 90* 71*  ALKPHOS 102 75  BILITOT 1.9* 0.7  PROT 8.7* 7.0  ALBUMIN 3.6 2.9*    Recent Labs Lab 09/07/15 0600  LIPASE 25   No results for input(s): AMMONIA in the last 168 hours. Coagulation Profile:  Recent Labs Lab 09/07/15 1456  INR 1.12   Cardiac Enzymes:  Recent Labs Lab 09/07/15 0600  TROPONINI <0.03   BNP (last 3 results) No results for input(s): PROBNP in the last 8760 hours. HbA1C:  Recent Labs  09/07/15 1456  HGBA1C 4.8     CBG:  Recent Labs Lab 09/08/15 1635  GLUCAP 113*   Lipid Profile: No results for input(s): CHOL, HDL, LDLCALC, TRIG, CHOLHDL, LDLDIRECT in the last 72 hours. Thyroid Function Tests: No results for input(s): TSH, T4TOTAL, FREET4, T3FREE, THYROIDAB in the last 72 hours. Anemia Panel: No results for input(s): VITAMINB12, FOLATE, FERRITIN, TIBC, IRON, RETICCTPCT in the last 72 hours. Urine analysis:    Component Value Date/Time   COLORURINE AMBER* 09/07/2015 0806   APPEARANCEUR CLEAR 09/07/2015 0806   LABSPEC 1.015 09/07/2015 0806   PHURINE 5.5 09/07/2015 0806   GLUCOSEU NEGATIVE 09/07/2015 0806   HGBUR NEGATIVE 09/07/2015 0806   BILIRUBINUR SMALL* 09/07/2015 0806   KETONESUR 15* 09/07/2015 0806   PROTEINUR 100* 09/07/2015 0806   UROBILINOGEN  2.0* 04/28/2013 1602   NITRITE NEGATIVE 09/07/2015 0806   LEUKOCYTESUR NEGATIVE 09/07/2015 0806   Sepsis Labs: @LABRCNTIP (procalcitonin:4,lacticidven:4)  ) Recent Results (from the past 240 hour(s))  MRSA PCR Screening     Status: None   Collection Time: 09/07/15  5:12 PM  Result Value Ref Range Status   MRSA by PCR NEGATIVE NEGATIVE Final    Comment:        The GeneXpert MRSA Assay (FDA approved for NASAL specimens only), is one component of a comprehensive MRSA colonization surveillance program. It is not intended to diagnose MRSA infection nor to guide or monitor treatment for MRSA infections.          Radiology Studies: Ct Head Wo Contrast  09/07/2015  CLINICAL DATA:  Dizziness and syncope EXAM: CT HEAD WITHOUT CONTRAST TECHNIQUE: Contiguous axial images were obtained from the base of the skull through the vertex without intravenous contrast. COMPARISON:  April 28, 2013 FINDINGS: There is mild diffuse atrophy. There is no intracranial mass, hemorrhage, extra-axial fluid collection, or midline shift. There is small vessel disease throughout the centra semiovale bilaterally. There is small vessel disease in the left  thalamus as well. No acute infarct evident. The knee calvarium appears intact. Visualized mastoid air cells are clear. No intraorbital lesions are evident. 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 No acute infarct evident. No hemorrhage or mass effect. Electronically Signed   By: Bretta Bang III M.D.   On: 09/07/2015 12:43   Ct Abdomen Pelvis W Contrast  09/07/2015  CLINICAL DATA:  Abdominal pain with nausea and vomiting for 4 days EXAM: CT ABDOMEN AND PELVIS WITH CONTRAST TECHNIQUE: Multidetector CT imaging of the abdomen and pelvis was performed using the standard protocol following bolus administration of intravenous contrast. CONTRAST:  75 mL ISOVUE-300 IOPAMIDOL (ISOVUE-300) INJECTION 61% COMPARISON:  None. FINDINGS: Lower chest: There is scarring in the anterior left lung base. There is no lung base edema or consolidation. There are scattered foci of coronary artery calcification. Hepatobiliary: There is a 7 mm cyst near the dome of the liver on the right anteriorly. No other focal liver lesion is evident on this study. There is cholelithiasis. Gallbladder wall is not appreciably thickened. There is no biliary duct dilatation. Pancreas: There is no pancreatic mass or inflammatory focus. Pancreatic duct is upper normal in size in the head and body region. No lesion within the pancreatic duct is seen by CT. Spleen: No splenic lesions are evident. Adrenals/Urinary Tract: Adrenals appear normal bilaterally. There is no appreciable renal mass or hydronephrosis. There is no renal or ureteral calculus on either side. Urinary bladder is midline with wall thickness within normal limits. Stomach/Bowel: There are scattered sigmoid diverticula without diverticulitis. Occasional colonic diverticula are noted elsewhere in the colon without inflammation. There is no appreciable bowel wall or mesenteric thickening. There is no bowel obstruction. No free air or  portal venous air. Vascular/Lymphatic: There is atherosclerotic calcification throughout the mid the distal aorta and common iliac arteries bilaterally. There is moderate calcification in the proximal superior mesenteric artery without high-grade obstruction seen. No vascular lesions are evident beyond areas of calcification. There is no adenopathy in the abdomen or pelvis by size criteria. There are scattered subcentimeter periportal lymph nodes. Reproductive: Prostate and seminal vesicles appear normal in size and contour. There is no pelvic mass or pelvic fluid collection. Other: Appendix appears unremarkable. There is no abscess or ascites in the abdomen or pelvis. There is fat in the left  inguinal ring. Musculoskeletal: There is vacuum phenomenon in the L4-5 disc. There are areas of degenerative type change in the lumbar spine. There is evidence of an old healed rib fracture on the left laterally. There are no blastic or lytic bone lesions. There is osteoarthritic change in the right sacroiliac joint. There is also osteoarthritic change in both hip joints. There is no intramuscular or abdominal wall lesion. IMPRESSION: Cholelithiasis.  Gallbladder wall does not appear thickened. There are colonic diverticula without diverticulitis. No bowel obstruction. No abscess. Appendix appears normal. No renal or ureteral calculus.  No hydronephrosis. There are scattered foci of coronary artery calcification. There is calcification at multiple sites in the aorta and common iliac arteries. Electronically Signed   By: Bretta Bang III M.D.   On: 09/07/2015 09:01        Scheduled Meds: . antiseptic oral rinse  7 mL Mouth Rinse q12n4p  . chlorhexidine  15 mL Mouth Rinse BID  . cloNIDine  0.2 mg Transdermal Weekly  . feeding supplement (ENSURE ENLIVE)  237 mL Oral BID BM  . folic acid  1 mg Oral Daily  . heparin  5,000 Units Subcutaneous Q8H  . hydrALAZINE  50 mg Oral Q8H  . multivitamin with minerals  1  tablet Oral Daily  . nicotine  21 mg Transdermal Daily  . pneumococcal 23 valent vaccine  0.5 mL Intramuscular Tomorrow-1000  . thiamine  100 mg Oral Daily   Continuous Infusions:    LOS: 2 days    Time spent: 40 minutes    Austin Mendez, Roselind Messier, MD [facility] Pager [phone]   If 7PM-7AM, please contact night-coverage [email or web address] Password [facility] 09/09/2015, 8:47 AM
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
## 2015-09-09 NOTE — Progress Notes (Signed)
Since 0730 this am pt has received 7mg  of IV Ativan with no change in agitation/resltessness. CIWA score increased to 24. RASS +2/+3. Pt in 5 point restraints, no safety sitter available this time. Pt trying to climb out of bed, HR 120's.   Dr Joseph ArtWoods called and notified. Order to give another 2mg  of Ativan now.   Pts sister arrived and at bedside. Pts sister trying to help calm and redirect pt also.

## 2015-09-10 ENCOUNTER — Inpatient Hospital Stay (HOSPITAL_COMMUNITY): Payer: Medicaid Other

## 2015-09-10 LAB — BASIC METABOLIC PANEL
Anion gap: 9 (ref 5–15)
BUN: 12 mg/dL (ref 6–20)
CALCIUM: 8.8 mg/dL — AB (ref 8.9–10.3)
CO2: 23 mmol/L (ref 22–32)
Chloride: 107 mmol/L (ref 101–111)
Creatinine, Ser: 1.18 mg/dL (ref 0.61–1.24)
GFR calc Af Amer: >60 mL/min (ref >60)
GLUCOSE: 121 mg/dL — AB (ref 65–99)
POTASSIUM: 3.1 mmol/L — AB (ref 3.5–5.1)
Sodium: 139 mmol/L (ref 135–145)

## 2015-09-10 LAB — GLUCOSE, CAPILLARY
GLUCOSE-CAPILLARY: 131 mg/dL — AB (ref 65–99)
GLUCOSE-CAPILLARY: 99 mg/dL (ref 65–99)
Glucose-Capillary: 88 mg/dL (ref 65–99)
Glucose-Capillary: 96 mg/dL (ref 65–99)

## 2015-09-10 LAB — MAGNESIUM: MAGNESIUM: 1.2 mg/dL — AB (ref 1.7–2.4)

## 2015-09-10 MED ORDER — MORPHINE SULFATE (PF) 2 MG/ML IV SOLN: 1.0000 mg | INTRAVENOUS | Status: DC | PRN

## 2015-09-10 MED ORDER — ACETAMINOPHEN 650 MG RE SUPP: 325.0000 mg | Freq: Four times a day (QID) | RECTAL | Status: DC | PRN

## 2015-09-10 MED ORDER — KCL IN DEXTROSE-NACL 20-5-0.9 MEQ/L-%-% IV SOLN
INTRAVENOUS | Status: DC
  Administered 2015-09-10 – 2015-09-12 (×2): via INTRAVENOUS
  Filled 2015-09-10 (×6): qty 1000

## 2015-09-10 MED ORDER — METOPROLOL TARTRATE 5 MG/5ML IV SOLN
10.0000 mg | Freq: Four times a day (QID) | INTRAVENOUS | Status: DC
  Administered 2015-09-10 – 2015-09-12 (×7): 10 mg via INTRAVENOUS
  Filled 2015-09-10 (×9): qty 10

## 2015-09-10 MED ORDER — HYDRALAZINE HCL 20 MG/ML IJ SOLN
10.0000 mg | Freq: Four times a day (QID) | INTRAMUSCULAR | Status: DC
  Administered 2015-09-10 – 2015-09-11 (×5): 10 mg via INTRAVENOUS
  Filled 2015-09-10 (×5): qty 1
  Filled 2015-09-10: qty 0.5
  Filled 2015-09-10 (×2): qty 1

## 2015-09-10 MED ORDER — THIAMINE HCL 100 MG/ML IJ SOLN
100.0000 mg | Freq: Every day | INTRAMUSCULAR | Status: DC
  Administered 2015-09-10 – 2015-09-11 (×2): 100 mg via INTRAVENOUS
  Filled 2015-09-10: qty 1
  Filled 2015-09-10: qty 2

## 2015-09-10 MED ORDER — ACETAMINOPHEN 500 MG PO TABS
500.0000 mg | ORAL_TABLET | Freq: Four times a day (QID) | ORAL | Status: DC | PRN
  Administered 2015-09-15 – 2015-09-21 (×3): 500 mg via ORAL
  Filled 2015-09-10 (×3): qty 1

## 2015-09-10 MED ORDER — MAGNESIUM SULFATE 4 GM/100ML IV SOLN
4.0000 g | Freq: Once | INTRAVENOUS | Status: AC
  Administered 2015-09-10: 4 g via INTRAVENOUS
  Filled 2015-09-10: qty 100

## 2015-09-10 MED ORDER — POTASSIUM CHLORIDE 10 MEQ/100ML IV SOLN
10.0000 meq | INTRAVENOUS | Status: AC
  Administered 2015-09-10 (×5): 10 meq via INTRAVENOUS
  Filled 2015-09-10 (×7): qty 100

## 2015-09-10 NOTE — Progress Notes (Signed)
Pt arrived to unit from ICU. Sitter at bedside per order. Pt confused but is following simple commands. Garbled speech, but nods yes/ no. Restraints on, order current. VSS. elink and CMT notified of new pt. Will continue to monitor for changes.

## 2015-09-10 NOTE — Progress Notes (Signed)
Austin Mendez TEAM 1 - Stepdown/ICU TEAM  SHAUNAK KREIS  ZOX:[id number] DOB: [birth year] DOA: 09/07/2015 PCP: Billee Cashing, MD    Brief Narrative:  55yo M Hx poorly controlled HTN, ETOH abuse, Substance Abuse, Alcoholism, and Syncope who presented to the ED on 6/16 with 4 d h/o nausea and vomiting with dizziness.  He was hypokalemic, hypomagnesemic, and his BP was elevated requiring IV antihypertensive.   Following admission he became progressively restless and agitated. CIWA protocol was utilized. He received a total of 16 mg ativan in the first 24hrs, 12 mg the following day, and on 6/17 only 4 mg.  Subjective: Pt is sedate.  When awakened, he displays garbled speech.  He does not follow commands.  He is somewhat agitated when disturbed.  He can not provide a hx or ROS.    Assessment & Plan:  Alcohol Withdrawal - Alcoholism Continues - cont ativan per CIWA protocol  Hypomagnesemia Cont to replace and follow - anticipate signif total body deficit  Hypokalemia Cont to replace and follow - anticipate signif total body deficit  Malignant Hypertension  A long standing issue complicated by noncompliance w/ medications - avoid overaggressive acute correction   Dizziness with presyncopal episode due to dehydration in conjunction with ETOH withdrawal - CT head negative   Tobacco abuse  Nicotine patch  Possible Acute Kidney Injury unknown baseline - admission Cr 1.3 - crt improving - follow trend   Hypoglycemia Appears to have resolved   DVT prophylaxis: SQ heparin Code Status: FULL CODE Family Communication: no family present at time of exam  Disposition Plan: SDU   Consultants:  PCCM  Procedures: none  Antimicrobials:  none   Objective: Blood pressure 148/114, pulse 65, temperature 97.7 F (36.5 C), temperature source Oral, resp. rate 24, height  (1.676 m), weight 49.9 kg (110 lb 0.2 oz), SpO2 100 %.  Intake/Output Summary (Last 24 hours) at 09/10/15  0846 Last data filed at 09/10/15 0600  Gross per 24 hour  Intake 1663.34 ml  Output   1200 ml  Net 463.34 ml   Filed Weights   09/08/15 0403 09/09/15 1258 09/10/15 0500  Weight: 60.374 kg (133 lb 1.6 oz) 60.3 kg (132 lb 15 oz) 49.9 kg (110 lb 0.2 oz)    Examination: General: No acute respiratory distress - lethargic and confused when awakened  Lungs: Clear to auscultation bilaterally without wheezes or crackles Cardiovascular: Regular rate and rhythm without murmur gallop or rub normal S1 and S2 Abdomen: Nontender, nondistended, soft, bowel sounds positive, no rebound, no ascites, no appreciable mass Extremities: No significant cyanosis, clubbing, or edema bilateral lower extremities  CBC:  Recent Labs Lab 09/07/15 0600 09/08/15 0400 09/08/15 1641  WBC 5.5 3.8* 7.8  NEUTROABS 3.3  --   --   HGB 14.6 11.7* 12.8*  HCT 41.4 34.6* 37.4*  MCV 90.8 92.3 92.3  PLT 221 180 198   Basic Metabolic Panel:  Recent Labs Lab 09/07/15 0600 09/07/15 0722 09/08/15 0400 09/08/15 1641 09/10/15 0245  NA 129*  --  135 135 139  K 2.9*  --  3.4* 3.5 3.1*  CL 89*  --  100* 100* 107  CO2 28  --  GLUCOSE 90  --  98 92 121*  BUN 11  --  CREATININE 1.30*  --  1.49* 1.46* 1.18  CALCIUM 9.7  --  9.3 9.3 8.8*  MG  --  1.5*  --  1.8 1.2*  GFR: Estimated Creatinine Clearance: 49.9 mL/min (by C-G formula based on Cr of 1.18).  Liver Function Tests:  Recent Labs Lab 09/07/15 0600 09/08/15 0400  AST 150* 108*  ALT 90* 71*  ALKPHOS 102 75  BILITOT 1.9* 0.7  PROT 8.7* 7.0  ALBUMIN 3.6 2.9*    Recent Labs Lab 09/07/15 0600  LIPASE 25    Coagulation Profile:  Recent Labs Lab 09/07/15 1456  INR 1.12    Cardiac Enzymes:  Recent Labs Lab 09/07/15 0600  TROPONINI <0.03    HbA1C: HGB A1C MFR BLD  Date/Time Value Ref Range Status  09/07/2015 02:56 PM 4.8 4.8 - 5.6 % Final    Comment:    (NOTE)         Pre-diabetes: 5.7 - 6.4         Diabetes:  >6.4         Glycemic control for adults with diabetes: <7.0   10/17/2009 06:11 PM  <5.7 % Final   5.0 (NOTE)                                                                       According to the ADA Clinical Practice Recommendations for 2011, when HbA1c is used as a screening test:   >=6.5%   Diagnostic of Diabetes Mellitus           (if abnormal result  is confirmed)  5.7-6.4%   Increased risk of developing Diabetes Mellitus  References:Diagnosis and Classification of Diabetes Mellitus,Diabetes Care,2011,34(Suppl 1):S62-S69 and Standards of Medical Care in         Diabetes - 2011,Diabetes Care,2011,34  (Suppl 1):S11-S61.    CBG:  Recent Labs Lab 09/09/15 1232 09/09/15 1618 09/09/15 2033 09/09/15 2342 09/10/15 0345  GLUCAP 76 101* 93 131* 99    Recent Results (from the past 240 hour(s))  MRSA PCR Screening     Status: None   Collection Time: 09/07/15  5:12 PM  Result Value Ref Range Status   MRSA by PCR NEGATIVE NEGATIVE Final    Comment:        The GeneXpert MRSA Assay (FDA approved for NASAL specimens only), is one component of a comprehensive MRSA colonization surveillance program. It is not intended to diagnose MRSA infection nor to guide or monitor treatment for MRSA infections.      Scheduled Meds: . antiseptic oral rinse  7 mL Mouth Rinse q12n4p  . chlorhexidine  15 mL Mouth Rinse BID  . cloNIDine  0.2 mg Transdermal Weekly  . feeding supplement (ENSURE ENLIVE)  237 mL Oral BID BM  . folic acid  1 mg Oral Daily  . heparin  5,000 Units Subcutaneous Q8H  . hydrALAZINE  50 mg Oral Q8H  . LORazepam  4 mg Intravenous Q6H  . metoprolol  10 mg Intravenous Q8H  . multivitamin with minerals  1 tablet Oral Daily  . nicotine  21 mg Transdermal Daily  . pneumococcal 23 valent vaccine  0.5 mL Intramuscular Tomorrow-1000  . thiamine  100 mg Oral Daily   Continuous Infusions: . sodium chloride Stopped (09/09/15 1450)  . dextrose 5 % and 0.9% NaCl 100 mL/hr at  09/10/15 0600     LOS: 3 days   Time spent: 35 minutes  Lonia BloodJeffrey T. Kyrillos Adams, MD [facility] Office  [phone] Pager - Text Page per Amion as per below:  On-Call/Text Page:      [email or web address]      password [email or web address]  If 7PM-7AM, please contact night-coverage [email or web address] Password [email or web address] 09/10/2015, 8:46 AM

## 2015-09-11 ENCOUNTER — Inpatient Hospital Stay (HOSPITAL_COMMUNITY): Payer: Medicaid Other

## 2015-09-11 DIAGNOSIS — R55 Syncope and collapse: Secondary | ICD-10-CM

## 2015-09-11 DIAGNOSIS — N179 Acute kidney failure, unspecified: Secondary | ICD-10-CM | POA: Diagnosis present

## 2015-09-11 LAB — ECHOCARDIOGRAM COMPLETE
CHL CUP DOP CALC LVOT VTI: 25.5 cm
CHL CUP MV DEC (S): 306
CHL CUP STROKE VOLUME: 54 mL
E/e' ratio: 8.72
EWDT: 306 ms
FS: 51 % — AB (ref 28–44)
HEIGHTINCHES: 66 in
IV/PV OW: 1.12
LA ID, A-P, ES: 32 mm
LA diam index: 1.87 cm/m2
LA vol A4C: 39 ml
LA vol index: 28.9 mL/m2
LAVOL: 49.5 mL
LEFT ATRIUM END SYS DIAM: 32 mm
LV PW d: 13.7 mm — AB (ref 0.6–1.1)
LV e' LATERAL: 7.4 cm/s
LV sys vol index: 13 mL/m2
LV sys vol: 22 mL (ref 21–61)
LVDIAVOL: 76 mL (ref 62–150)
LVDIAVOLIN: 44 mL/m2
LVEEAVG: 8.72
LVEEMED: 8.72
LVOT area: 4.15 cm2
LVOTD: 23 mm
LVOTPV: 104 cm/s
LVOTSV: 106 mL
MVPKAVEL: 91.2 m/s
MVPKEVEL: 64.5 m/s
Simpson's disk: 71
TAPSE: 21.2 mm
TDI e' lateral: 7.4
TDI e' medial: 4.9
WEIGHTICAEL: 2218.71 [oz_av]

## 2015-09-11 LAB — GLUCOSE, CAPILLARY
GLUCOSE-CAPILLARY: 93 mg/dL (ref 65–99)
GLUCOSE-CAPILLARY: 120 mg/dL — AB (ref 65–99)
Glucose-Capillary: 76 mg/dL (ref 65–99)
Glucose-Capillary: 108 mg/dL — ABNORMAL HIGH (ref 65–99)
Glucose-Capillary: 102 mg/dL — ABNORMAL HIGH (ref 65–99)

## 2015-09-11 LAB — CBC
HEMATOCRIT: 37 % — AB (ref 39.0–52.0)
HEMOGLOBIN: 12.2 g/dL — AB (ref 13.0–17.0)
MCH: 31.4 pg (ref 26.0–34.0)
MCHC: 33 g/dL (ref 30.0–36.0)
MCV: 95.4 fL (ref 78.0–100.0)
Platelets: 179 10*3/uL (ref 150–400)
RBC: 3.88 MIL/uL — ABNORMAL LOW (ref 4.22–5.81)
RDW: 15.5 % (ref 11.5–15.5)
WBC: 7.9 10*3/uL (ref 4.0–10.5)

## 2015-09-11 LAB — PHOSPHORUS: Phosphorus: 2.5 mg/dL (ref 2.5–4.6)

## 2015-09-11 LAB — COMPREHENSIVE METABOLIC PANEL
ALBUMIN: 2.6 g/dL — AB (ref 3.5–5.0)
ALT: 45 U/L (ref 17–63)
ANION GAP: 7 (ref 5–15)
AST: 58 U/L — ABNORMAL HIGH (ref 15–41)
Alkaline Phosphatase: 67 U/L (ref 38–126)
BILIRUBIN TOTAL: 0.9 mg/dL (ref 0.3–1.2)
BUN: 14 mg/dL (ref 6–20)
CHLORIDE: 107 mmol/L (ref 101–111)
CO2: 23 mmol/L (ref 22–32)
Calcium: 9 mg/dL (ref 8.9–10.3)
Creatinine, Ser: 1.08 mg/dL (ref 0.61–1.24)
GFR calc Af Amer: >60 mL/min (ref >60)
GFR calc non Af Amer: >60 mL/min (ref >60)
GLUCOSE: 91 mg/dL (ref 65–99)
POTASSIUM: 3.7 mmol/L (ref 3.5–5.1)
Sodium: 137 mmol/L (ref 135–145)
TOTAL PROTEIN: 6.8 g/dL (ref 6.5–8.1)

## 2015-09-11 LAB — MAGNESIUM: MAGNESIUM: 1.6 mg/dL — AB (ref 1.7–2.4)

## 2015-09-11 MED ORDER — MAGNESIUM SULFATE 2 GM/50ML IV SOLN
2.0000 g | Freq: Once | INTRAVENOUS | Status: AC
  Administered 2015-09-11: 2 g via INTRAVENOUS
  Filled 2015-09-11: qty 50

## 2015-09-11 MED ORDER — HYDRALAZINE HCL 20 MG/ML IJ SOLN
15.0000 mg | Freq: Four times a day (QID) | INTRAMUSCULAR | Status: DC
  Administered 2015-09-12 (×2): 15 mg via INTRAVENOUS
  Filled 2015-09-11 (×2): qty 1

## 2015-09-11 MED ORDER — VITAMIN B-1 100 MG PO TABS
100.0000 mg | ORAL_TABLET | Freq: Every day | ORAL | Status: DC
  Administered 2015-09-12 – 2015-09-21 (×9): 100 mg via ORAL
  Filled 2015-09-11 (×10): qty 1
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
## 2015-09-11 NOTE — Progress Notes (Signed)
  Echocardiogram 2D Echocardiogram has been performed.  Janalyn HarderWest, Xochitl Egle R 09/11/2015, 10:04 AM

## 2015-09-11 NOTE — Progress Notes (Signed)
PROGRESS NOTE    Austin Mendez  ZOX:[id number] DOB: 01-13-1961 DOA: 09/07/2015 PCP: Billee Cashing, MD   Brief Narrative:  55 y.o. BM PMHx poorly controlled HTN, ETOH abuse, Substance Abuse (at admission admitted to beer 540oz/dy), Syncope and collapse,  Presenting to to the ED on 6/16 with 4 d h/o nausea and vomiting, diziness,.  No history of withrawals in the past. He was dehydrated, given IVF in the Er. He also was hypokalemic, hypomagnesemic and his BBP was very elevated requiring IV antihypertensive. At the ED, electrolytes and IVF were replenished. He was to ambulate to the bathroom, But became very dizzy for which it felt prudent to admit management of symptoms. He was admitted to the SDU setting. Became progressively restless and agitated. CIWA protocol started for w/d. He had received total of 16 mg ativan, then 12 mg the following day, and on 6/17 only 4 mg. PCCM was asked to see for agitation & concern for w/d   Assessment & Plan:   Principal Problem:   Malignant hypertension Active Problems:   Alcohol abuse   Essential hypertension   Syncope and collapse   Hypokalemia   Acute kidney injury (HCC)   Hypomagnesemia   Epigastric pain   ETOH abuse   Uncontrolled hypertension   Noncompliance with treatment   Tobacco abuse   AKI (acute kidney injury) (HCC)  Malignant Hypertension/Noncompliance medication -Per admitting note BP has been running in the 170's/110's for at least 1 year.  -Troponin negative  -Clonidine patch 0.2 mg q 7 days -Metoprolol IV10 mg TID -Increase Hydralazine IV 15 mg QID  Dizziness with presyncopal episode likely due to dehydration in conjunction with ETOH withdrawal  -CT head negative  -Currently withdrawing not an issue -2 D echo in 2014 CHF with Gr 1 DD;-Echocardiogram pending  Alcohol abuse/Withdrawal  -Per admission note, prior to admission Last drink 4 days ago. - CIWA  Protocol;  -CIWA protocol not adequate by itself  add Ativan 4 mg QID scheduled -Thiamine, folate, MVI per protocol -UDS positive benzodiazepine; expected patient on benzos   Tobacco abuse  - Nicotine patch  - Counseled cessation  CKD, +/- Acute Kidney Injury; unknown baseline (admission Cr 1.3)  -Strict I/O -Daily weights Filed Weights   09/09/15 1258 09/10/15 0500 09/11/15 0630  Weight: 60.3 kg (132 lb 15 oz) 49.9 kg (110 lb 0.2 oz) 62.9 kg (138 lb 10.7 oz)   Lab Results  Component Value Date   CREATININE 1.08 09/11/2015   CREATININE 1.18 09/10/2015   CREATININE 1.46* 09/08/2015  -D5-NS+ Kcl 20 mEq 60 ml/hr  Hypoglycemia -CBG q 4hr -See CKD  Hypomagnesemia -Magnesium IV 2 gm  Hypokalemia -Resolved    DVT prophylaxis: Subcutaneous heparin Code Status: Full Family Communication: None Disposition Plan: Resolution alcohol withdrawal   Consultants:  Dr. 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 --No acute infarct evident 6/20 echocardiogram;    Cultures NA  Antimicrobials: NA   Devices NA   LINES / TUBES:  NA    Continuous Infusions: . dextrose 5 % and 0.9 % NaCl with KCl 20 mEq/L 60 mL/hr at 09/11/15 1500     Subjective: 6/20 A/O 2 (does not know when, why).    Objective: Filed Vitals:   09/11/15 1535 09/11/15 1723 09/11/15 1803 09/11/15 1945  BP: 146/76 160/96 174/107   Pulse: 72 81 75   Temp:    98.2 F (36.8 C)  TempSrc:    Oral  Resp:  23 21   Height:      Weight:      SpO2:        Intake/Output Summary (Last 24 hours) at 09/11/15 2147 Last data filed at 09/11/15 1948  Gross per 24 hour  Intake   1490 ml  Output    550 ml  Net    940 ml   Filed Weights   09/09/15 1258 09/10/15 0500 09/11/15 0630  Weight: 60.3 kg (132 lb 15 oz) 49.9 kg (110 lb 0.2 oz) 62.9 kg (138 lb 10.7 oz)    Examination:  General: A/O 2 (does not know when, why), NAD, No acute respiratory  distress Eyes: negative scleral hemorrhage, negative anisocoria, negative icterus ENT: Negative Runny nose, negative gingival bleeding, Neck:  Negative scars, masses, torticollis, lymphadenopathy, JVD Lungs: Clear to auscultation bilaterally without wheezes or crackles Cardiovascular: Regular rate and rhythm without murmur gallop or rub normal S1 and S2 Abdomen: negative abdominal pain, nondistended, positive soft, bowel sounds, no rebound, no ascites, no appreciable mass Extremities: No significant cyanosis, clubbing, or edema bilateral lower extremities Skin: Negative rashes, lesions, ulcers Psychiatric: Unable to assess; patient appears confused but pleasant Central nervous system:  Cranial nerves II through XII intact, tongue/uvula midline, all extremities muscle strength 5/5, sensation intact throughout, positive dysarthria, negative expressive aphasia, negative receptive aphasia. Follows all commands   .     Data Reviewed: Care during the described time interval was provided by me .  I have reviewed this patient's available data, including medical history, events of note, physical examination, and all test results as part of my evaluation. I have personally reviewed and interpreted all radiology studies.  CBC:  Recent Labs Lab 09/07/15 0600 09/08/15 0400 09/08/15 1641 09/11/15 0434  WBC 5.5 3.8* 7.8 7.9  NEUTROABS 3.3  --   --   --   HGB 14.6 11.7* 12.8* 12.2*  HCT 41.4 34.6* 37.4* 37.0*  MCV 90.8 92.3 92.3 95.4  PLT 221 180 198 179   Basic Metabolic Panel:  Recent Labs Lab 09/07/15 0600 09/07/15 0722 09/08/15 0400 09/08/15 1641 09/10/15 0245 09/11/15 0434  NA 129*  --  135 135 139 137  K 2.9*  --  3.4* 3.5 3.1* 3.7  CL 89*  --  100* 100* 107 107  CO2 28  --  28 28 23 23   GLUCOSE 90  --  98 92 121* 91  BUN 11  --  16 16 12 14   CREATININE 1.30*  --  1.49* 1.46* 1.18 1.08  CALCIUM 9.7  --  9.3 9.3 8.8* 9.0  MG  --  1.5*  --  1.8 1.2* 1.6*  PHOS  --   --   --    --   --  2.5   GFR: Estimated Creatinine Clearance: 68.8 mL/min (by C-G formula based on Cr of 1.08). Liver Function Tests:  Recent Labs Lab 09/07/15 0600 09/08/15 0400 09/11/15 0434  AST 150* 108* 58*  ALT 90* 71* 45  ALKPHOS 102 75 67  BILITOT 1.9* 0.7 0.9  PROT 8.7* 7.0 6.8  ALBUMIN 3.6 2.9* 2.6*    Recent Labs Lab 09/07/15 0600  LIPASE 25   No results for input(s): AMMONIA in the last 168 hours. Coagulation Profile:  Recent Labs Lab 09/07/15 1456  INR 1.12   Cardiac Enzymes:  Recent Labs Lab 09/07/15 0600  TROPONINI <0.03   BNP (last 3 results) No results for input(s): PROBNP in the  last 8760 hours. HbA1C: No results for input(s): HGBA1C in the last 72 hours. CBG:  Recent Labs Lab 09/10/15 2111 09/11/15 0827 09/11/15 1128 09/11/15 1619 09/11/15 2112  GLUCAP 96 76 108* 102* 120*   Lipid Profile: No results for input(s): CHOL, HDL, LDLCALC, TRIG, CHOLHDL, LDLDIRECT in the last 72 hours. Thyroid Function Tests: No results for input(s): TSH, T4TOTAL, FREET4, T3FREE, THYROIDAB in the last 72 hours. Anemia Panel: No results for input(s): VITAMINB12, FOLATE, FERRITIN, TIBC, IRON, RETICCTPCT in the last 72 hours. Urine analysis:    Component Value Date/Time   COLORURINE AMBER* 09/07/2015 0806   APPEARANCEUR CLEAR 09/07/2015 0806   LABSPEC 1.015 09/07/2015 0806   PHURINE 5.5 09/07/2015 0806   GLUCOSEU NEGATIVE 09/07/2015 0806   HGBUR NEGATIVE 09/07/2015 0806   BILIRUBINUR SMALL* 09/07/2015 0806   KETONESUR 15* 09/07/2015 0806   PROTEINUR 100* 09/07/2015 0806   UROBILINOGEN 2.0* 04/28/2013 1602   NITRITE NEGATIVE 09/07/2015 0806   LEUKOCYTESUR NEGATIVE 09/07/2015 0806   Sepsis Labs: (procalcitonin:4,lacticidven:4)  ) Recent Results (from the past 240 hour(s))  MRSA PCR Screening     Status: None   Collection Time: 09/07/15  5:12 PM  Result Value Ref Range Status   MRSA by PCR NEGATIVE NEGATIVE Final    Comment:        The  GeneXpert MRSA Assay (FDA approved for NASAL specimens only), is one component of a comprehensive MRSA colonization surveillance program. It is not intended to diagnose MRSA infection nor to guide or monitor treatment for MRSA infections.          Radiology Studies: No results found.      Scheduled Meds: . antiseptic oral rinse  7 mL Mouth Rinse q12n4p  . chlorhexidine  15 mL Mouth Rinse BID  . cloNIDine  0.2 mg Transdermal Weekly  . feeding supplement (ENSURE ENLIVE)  237 mL Oral BID BM  . folic acid  1 mg Oral Daily  . heparin  5,000 Units Subcutaneous Q8H  . hydrALAZINE  15 mg Intravenous Q6H  . LORazepam  4 mg Intravenous Q6H  . metoprolol  10 mg Intravenous Q6H  . multivitamin with minerals  1 tablet Oral Daily  . nicotine  21 mg Transdermal Daily  . thiamine  100 mg Oral Daily   Continuous Infusions: . dextrose 5 % and 0.9 % NaCl with KCl 20 mEq/L 60 mL/hr at 09/11/15 1500     LOS: 4 days    Time spent: 40 minutes    Taylorann Tkach, Roselind Messier, MD [facility] Pager [phone]   If 7PM-7AM, please contact night-coverage [email or web address] Password [facility] 09/11/2015, 9:47 PM

## 2015-09-12 LAB — GLUCOSE, CAPILLARY: Glucose-Capillary: 91 mg/dL (ref 65–99)

## 2015-09-12 MED ORDER — CLONIDINE HCL 0.2 MG PO TABS
0.2000 mg | ORAL_TABLET | Freq: Three times a day (TID) | ORAL | Status: DC
  Administered 2015-09-12 – 2015-09-13 (×6): 0.2 mg via ORAL
  Filled 2015-09-12 (×8): qty 1

## 2015-09-12 MED ORDER — METOPROLOL TARTRATE 50 MG PO TABS
50.0000 mg | ORAL_TABLET | Freq: Two times a day (BID) | ORAL | Status: DC
  Administered 2015-09-12 – 2015-09-15 (×5): 50 mg via ORAL
  Filled 2015-09-12 (×8): qty 1

## 2015-09-12 MED ORDER — LORAZEPAM 2 MG/ML IJ SOLN
2.0000 mg | INTRAMUSCULAR | Status: DC | PRN
  Administered 2015-09-12: 3 mg via INTRAVENOUS
  Administered 2015-09-12: 2 mg via INTRAVENOUS
  Administered 2015-09-12: 3 mg via INTRAVENOUS
  Administered 2015-09-12: 2 mg via INTRAVENOUS
  Administered 2015-09-13: 3 mg via INTRAVENOUS
  Filled 2015-09-12 (×2): qty 1
  Filled 2015-09-12: qty 2
  Filled 2015-09-12: qty 1
  Filled 2015-09-12: qty 2
  Filled 2015-09-12 (×3): qty 1

## 2015-09-12 MED ORDER — HYDRALAZINE HCL 20 MG/ML IJ SOLN
10.0000 mg | INTRAMUSCULAR | Status: DC | PRN
  Administered 2015-09-12 – 2015-09-15 (×5): 10 mg via INTRAVENOUS
  Filled 2015-09-12 (×6): qty 1

## 2015-09-12 MED ORDER — LORAZEPAM 2 MG/ML IJ SOLN
2.0000 mg | Freq: Once | INTRAMUSCULAR | Status: AC
  Administered 2015-09-12: 2 mg via INTRAVENOUS

## 2015-09-12 MED ORDER — LORAZEPAM 2 MG/ML IJ SOLN
2.0000 mg | INTRAMUSCULAR | Status: DC | PRN
  Administered 2015-09-12: 2 mg via INTRAVENOUS
  Filled 2015-09-12 (×2): qty 1

## 2015-09-12 MED ORDER — LORAZEPAM 2 MG/ML IJ SOLN
4.0000 mg | Freq: Once | INTRAMUSCULAR | Status: AC
  Administered 2015-09-12: 4 mg via INTRAVENOUS
  Filled 2015-09-12: qty 2

## 2015-09-12 MED ORDER — HYDRALAZINE HCL 25 MG PO TABS
25.0000 mg | ORAL_TABLET | Freq: Four times a day (QID) | ORAL | Status: DC
  Administered 2015-09-12 – 2015-09-14 (×7): 25 mg via ORAL
  Filled 2015-09-12 (×7): qty 1

## 2015-09-12 MED ORDER — TRAMADOL HCL 50 MG PO TABS
50.0000 mg | ORAL_TABLET | Freq: Four times a day (QID) | ORAL | Status: DC | PRN
  Administered 2015-09-19: 50 mg via ORAL
  Filled 2015-09-12: qty 1

## 2015-09-12 MED ORDER — LORAZEPAM 2 MG/ML IJ SOLN: 2.0000 mg | Freq: Once | INTRAMUSCULAR | Status: AC

## 2015-09-12 MED ORDER — HALOPERIDOL LACTATE 5 MG/ML IJ SOLN
5.0000 mg | Freq: Once | INTRAMUSCULAR | Status: AC
  Administered 2015-09-12: 5 mg via INTRAVENOUS
  Filled 2015-09-12: qty 1

## 2015-09-12 MED ORDER — POTASSIUM CHLORIDE CRYS ER 20 MEQ PO TBCR
40.0000 meq | EXTENDED_RELEASE_TABLET | Freq: Every day | ORAL | Status: DC
  Administered 2015-09-12 – 2015-09-21 (×9): 40 meq via ORAL
  Filled 2015-09-12 (×10): qty 2

## 2015-09-12 MED ORDER — OXYCODONE HCL 5 MG PO TABS: 5.0000 mg | ORAL_TABLET | Freq: Four times a day (QID) | ORAL | Status: DC | PRN
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
## 2015-09-12 NOTE — Progress Notes (Signed)
[facility] TEAM 1 - Stepdown/ICU TEAM  Austin Mendez  WJX:[id number] DOB: 1960-09-03 DOA: 09/07/2015 PCP: Billee Cashing, MD    Brief Narrative:  55yo M Hx poorly controlled HTN, ETOH abuse, Substance Abuse, Alcoholism, and Syncope who presented to the ED on 6/16 with 4 d h/o nausea and vomiting with dizziness.  He was hypokalemic, hypomagnesemic, and his BP was elevated requiring IV antihypertensive.   Following admission he became progressively restless and agitated. CIWA protocol was utilized. He received a total of 16 mg ativan in the first 24hrs, 12 mg the following day, and on 6/17 only 4 mg.  Subjective: The pt is much more alert and conversant.  He c/o hunger, and generalize weakness.  He is able to tell me where he is and why he is here.  He is no longer agitated.    Assessment & Plan:  Alcohol Withdrawal - Alcoholism Appears to have essentially resolve d - decrease frequency of CIWA w/ goal to wean to off   Hypomagnesemia Persistent - cont to replace and follow - anticipate signif total body deficit  Hypokalemia Much improved - cont to replace and follow - anticipate signif total body deficit  Malignant Hypertension  A long standing issue complicated by noncompliance w/ medications - adjust treatment further and follow trend   Dizziness with presyncopal episode due to dehydration in conjunction with ETOH withdrawal - CT head negative - TTE notes preserved EF w/ no WMA and 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 rate 29, height  (1.676 m), weight 65 kg (143 lb 4.8 oz), SpO2 94 %.  Intake/Output Summary (Last 24 hours) at 09/12/15 1048 Last data filed at 09/12/15 1001  Gross per 24 hour  Intake   1684 ml  Output   1225 ml  Net    459 ml   Filed Weights   09/10/15 0500 09/11/15 0630 09/12/15 0500  Weight: 49.9 kg (110 lb 0.2 oz) 62.9 kg (138 lb 10.7 oz) 65 kg (143 lb 4.8 oz)    Examination: General: No acute respiratory distress - alert  Lungs: Clear to auscultation bilaterally without wheezes  Cardiovascular: Regular rate and rhythm without murmur gallop or rub  Abdomen: Nontender, nondistended, soft, bowel sounds positive, no rebound, no ascites, no appreciable mass Extremities: No significant cyanosis, clubbing, edema bilateral lower extremities  CBC:  Recent Labs Lab 09/07/15 0600 09/08/15 0400 09/08/15 1641 09/11/15 0434  WBC 5.5 3.8* 7.8 7.9  NEUTROABS 3.3  --   --   --   HGB 14.6 11.7* 12.8* 12.2*  HCT 41.4 34.6* 37.4* 37.0*  MCV 90.8 92.3 92.3 95.4  PLT 221 180 198 179   Basic Metabolic Panel:  Recent Labs Lab 09/07/15 0600 09/07/15 0722 09/08/15 0400 09/08/15 1641 09/10/15 0245 09/11/15 0434  NA 129*  --  135 135 139 137  K 2.9*  --  3.4* 3.5 3.1* 3.7  CL 89*  --  100* 100* 107 107  CO2 28  --  28 28 23 23   GLUCOSE 90  --  98 92 121* 91  BUN 11  --  16 16 12 14   CREATININE 1.30*  --  1.49* 1.46* 1.18 1.08  CALCIUM 9.7  --  9.3 9.3 8.8* 9.0  MG  --  1.5*  --  1.8 1.2* 1.6*  PHOS  --   --   --   --   --  2.5   GFR: Estimated Creatinine Clearance: 69.7 mL/min (by C-G formula based on Cr of 1.08).  Liver Function Tests:  Recent Labs Lab 09/07/15 0600 09/08/15 0400 09/11/15 0434  AST 150* 108* 58*  ALT 90* 71* 45  ALKPHOS 102 75 67  BILITOT 1.9* 0.7 0.9  PROT 8.7* 7.0 6.8  ALBUMIN 3.6 2.9* 2.6*    Recent Labs Lab 09/07/15 0600   LIPASE 25    Coagulation Profile:  Recent Labs Lab 09/07/15 1456  INR 1.12    Cardiac Enzymes:  Recent Labs Lab 09/07/15 0600  TROPONINI <0.03    HbA1C: HGB A1C MFR BLD  Date/Time Value Ref Range Status  09/07/2015 02:56 PM 4.8 4.8 - 5.6 % Final    Comment:    (NOTE)         Pre-diabetes: 5.7 - 6.4         Diabetes: >6.4         Glycemic control for adults with diabetes: <7.0   10/17/2009 06:11 PM  <5.7 % Final   5.0 (NOTE)                                                                       According to the ADA Clinical Practice Recommendations for 2011, when HbA1c is used as a screening test:   >=6.5%   Diagnostic of Diabetes Mellitus           (if abnormal result  is confirmed)  5.7-6.4%   Increased risk of developing Diabetes Mellitus  References:Diagnosis and Classification of Diabetes Mellitus,Diabetes Care,2011,34(Suppl 1):S62-S69 and Standards of Medical Care in         Diabetes - 2011,Diabetes Care,2011,34  (Suppl 1):S11-S61.    CBG:  Recent Labs Lab 09/11/15 0827 09/11/15 1128 09/11/15 1619 09/11/15 2112 09/12/15 0759  GLUCAP 76 108* 102* 120* 91    Recent Results (from the past 240 hour(s))  MRSA PCR Screening     Status: None   Collection Time: 09/07/15  5:12 PM  Result Value Ref Range Status   MRSA by PCR NEGATIVE NEGATIVE Final    Comment:        The GeneXpert MRSA Assay (FDA approved for NASAL specimens only), is one component of a comprehensive MRSA colonization surveillance program. It is not intended to diagnose MRSA infection nor to guide or monitor treatment for MRSA infections.      Scheduled Meds: . antiseptic oral rinse  7 mL Mouth Rinse q12n4p  . chlorhexidine  15 mL Mouth Rinse BID  . cloNIDine  0.2 mg Transdermal Weekly  . feeding supplement (ENSURE ENLIVE)  237 mL Oral BID BM  . folic acid  1 mg Oral Daily  . heparin  5,000 Units Subcutaneous Q8H  . hydrALAZINE  15 mg Intravenous Q6H  . LORazepam  4 mg Intravenous  Q6H  . metoprolol  10 mg Intravenous Q6H  . multivitamin with minerals  1 tablet Oral Daily  . nicotine  21 mg Transdermal Daily  . thiamine  100 mg Oral Daily   Continuous Infusions: . dextrose 5 % and 0.9 % NaCl with KCl 20 mEq/L 60 mL/hr at 09/12/15 0856     LOS: 5 days   Time spent: 35 minutes   Lonia Blood, MD [facility] Office  [phone] Pager - Text Page per Loretha Stapler as per below:  On-Call/Text Page:      [email or web address]      password [email or web address]  If 7PM-7AM, please contact night-coverage [email or web address] Password [facility] 09/12/2015, 10:48 AM
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
## 2015-09-12 NOTE — Care Management Note (Signed)
Case Management Note  Patient Details  Name: Austin Mendez MRN: [id number] Date of Birth: 31-Jul-1960  Subjective/Objective:      HTN, ETOH, weakness               Action/Plan: Discharge Planning:  PCP Billee CashingMCKENZIE, WAYLAND MD  NCM spoke to pt and states he lives at home with wife, Austin Mendez. Gave permission to speak to wife. States he was not having the issues with his speech prior to admission. States he quit drinking but smokes occasionally. Pt states he currently does not work. Contacted wife, Austin Mendez via phone # [phone]. Wife states pt does drink occasions still and has been in declining health since 2010. He has not worked since that time. Medicaid was recently reinstated. Explained he can pick up his meds from pharmacy for small copay $3.00 or they can request a copay waiver. Explained PT/OT will work eval and tx today. [facility] but for qualifying diagnosis. Waiting final recommendations for home.    Expected Discharge Date:  09/13/2015               Expected Discharge Plan:  Home/Self Care  In-House Referral:  NA  Discharge planning Services  CM Consult  Post Acute Care Choice:    Choice offered to:     DME Arranged:    DME Agency:     HH Arranged:    HH Agency:     Status of Service:  In process, will continue to follow  If discussed at Long Length of Stay Meetings, dates discussed:    Additional Comments:  Elliot CousinShavis, Tykeshia Tourangeau Ellen, RN 09/12/2015, 11:20 AM
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
## 2015-09-13 ENCOUNTER — Inpatient Hospital Stay (HOSPITAL_COMMUNITY): Payer: Medicaid Other | Admitting: Certified Registered Nurse Anesthetist

## 2015-09-13 DIAGNOSIS — R410 Disorientation, unspecified: Secondary | ICD-10-CM

## 2015-09-13 DIAGNOSIS — R4182 Altered mental status, unspecified: Secondary | ICD-10-CM | POA: Diagnosis present

## 2015-09-13 LAB — BASIC METABOLIC PANEL
ANION GAP: 7 (ref 5–15)
BUN: 8 mg/dL (ref 6–20)
CALCIUM: 9.4 mg/dL (ref 8.9–10.3)
CHLORIDE: 107 mmol/L (ref 101–111)
CO2: 22 mmol/L (ref 22–32)
CREATININE: 0.89 mg/dL (ref 0.61–1.24)
GFR calc non Af Amer: >60 mL/min (ref >60)
Glucose, Bld: 97 mg/dL (ref 65–99)
Potassium: 3.8 mmol/L (ref 3.5–5.1)
SODIUM: 136 mmol/L (ref 135–145)

## 2015-09-13 LAB — VITAMIN B12: VITAMIN B 12: 320 pg/mL (ref 180–914)

## 2015-09-13 LAB — MAGNESIUM: MAGNESIUM: 1.3 mg/dL — AB (ref 1.7–2.4)

## 2015-09-13 LAB — FOLATE: Folate: 17.4 ng/mL (ref >5.9)

## 2015-09-13 MED ORDER — DEXTROSE 5 % IV SOLN
3.0000 g | Freq: Once | INTRAVENOUS | Status: AC
  Administered 2015-09-13: 3 g via INTRAVENOUS
  Filled 2015-09-13: qty 6

## 2015-09-13 NOTE — Progress Notes (Signed)
Occupational Therapy Evaluation Patient Details Name: Willa RoughWilliam D Lizarraga MRN: [id number] DOB: 10/12/1960 Today's Date: 09/13/2015    History of Present Illness 55 y.o. male with medical history significant for poorly controlled HTN, ETOH abuse, presenting to to the ED with nausea and vomiting, diziness with onset about 4 days ago, accompanied by mild epigastric pain without diarrhea,.    Clinical Impression   PTA, pt apparenttly lived at home with his wife and states he was independent with ADL and mobility, although unsure of accuracy of information. Pt currently requires +2 mod A for mobility and Max A for ADL. At this time, recommend rehab at SNF prior to returning home. Will follow acutely to address established goals and facilitate safe D?C to next venue of care.     Follow Up Recommendations  SNF;Supervision/Assistance - 24 hour    Equipment Recommendations  Other (comment) (TBA at SNF)    Recommendations for Other Services       Precautions / Restrictions Precautions Precautions: Fall Restrictions Weight Bearing Restrictions: No      Mobility Bed Mobility Overal bed mobility: Needs Assistance Bed Mobility: Supine to Sit;Sit to Supine     Supine to sit: Supervision Sit to supine: Supervision   General bed mobility comments: Pt assisted wtih scooting up in bed by pulling up on bedreails  Transfers Overall transfer level: Needs assistance Equipment used: 2 person hand held assist Transfers: Sit to/from UGI CorporationStand;Stand Pivot Transfers Sit to Stand: +2 physical assistance;Mod assist Stand pivot transfers: Mod assist;+2 physical assistance            Balance Overall balance assessment: Needs assistance   Sitting balance-Leahy Scale: Fair       Standing balance-Leahy Scale: Poor                              ADL Overall ADL's : Needs assistance/impaired Eating/Feeding: NPO   Grooming: Minimal assistance;Sitting   Upper Body Bathing:  Moderate assistance;Sitting   Lower Body Bathing: Moderate assistance;Sit to/from stand   Upper Body Dressing : Maximal assistance;Sitting   Lower Body Dressing: Maximal assistance;Sit to/from stand       Toileting- ArchitectClothing Manipulation and Hygiene: Maximal assistance Toileting - Clothing Manipulation Details (indicate cue type and reason): foley. Pt asking to use the bathrrom adn astook to urinate in foley     Functional mobility during ADLs: Moderate assistance;+2 for physical assistance General ADL Comments: Ambulated with +2 assist with pt's arms over therapist's shoulders     Vision Additional Comments: need to asses   Perception     Praxis      Pertinent Vitals/Pain Pain Assessment: Faces Faces Pain Scale: No hurt     Hand Dominance     Extremity/Trunk Assessment Upper Extremity Assessment Upper Extremity Assessment: Generalized weakness   Lower Extremity Assessment Lower Extremity Assessment: Defer to PT evaluation   Cervical / Trunk Assessment Cervical / Trunk Assessment: Kyphotic   Communication Communication Communication: Expressive difficulties (dysarthric)   Cognition Arousal/Alertness: Lethargic;Suspect due to medications Behavior During Therapy: Restless;Flat affect;Impulsive Overall Cognitive Status: Impaired/Different from baseline Area of Impairment: Orientation;Attention;Memory;Following commands;Safety/judgement;Awareness;Problem solving Orientation Level: Disoriented to;Place;Time;Situation Current Attention Level: Sustained   Following Commands: Follows one step commands with increased time Safety/Judgement: Decreased awareness of safety;Decreased awareness of deficits Awareness: Intellectual Problem Solving: Slow processing;Decreased initiation;Difficulty sequencing;Requires verbal cues  Apparent confusion. Most likely experiencing delirium also.    General Comments       Exercises  Shoulder Instructions      Home Living  Family/patient expects to be discharged to:: Unsure                                        Prior Functioning/Environment Level of Independence:  (unsure)             OT Diagnosis: Generalized weakness;Cognitive deficits;Altered mental status   OT Problem List: Decreased strength;Decreased activity tolerance;Impaired balance (sitting and/or standing);Decreased coordination;Decreased cognition;Decreased safety awareness;Decreased knowledge of use of DME or AE;Cardiopulmonary status limiting activity   OT Treatment/Interventions: Self-care/ADL training;Therapeutic exercise;DME and/or AE instruction;Therapeutic activities;Patient/family education;Balance training    OT Goals(Current goals can be found in the care plan section) Acute Rehab OT Goals Patient Stated Goal: to go home OT Goal Formulation: Patient unable to participate in goal setting Time For Goal Achievement: 09/27/15  OT Frequency: Min 2X/week   Barriers to D/C: Other (comment) (unsure of caregiver support)          Co-evaluation PT/OT/SLP Co-Evaluation/Treatment: Yes Reason for Co-Treatment: Necessary to address cognition/behavior during functional activity;For patient/therapist safety   OT goals addressed during session: ADL's and self-care      End of Session Equipment Utilized During Treatment: Gait belt Nurse Communication: Mobility status  Activity Tolerance: Patient tolerated treatment well Patient left: in bed;with call bell/phone within reach;with bed alarm set;with restraints reapplied   Time: [id number] OT Time Calculation (min): 22 min Charges:  OT General Charges $OT Visit: 1 Procedure OT Evaluation $OT Eval Moderate Complexity: 1 Procedure G-Codes:    Elisabetta Mishra,HILLARY 09/13/2015, 9:15 AM   Luisa DagoHilary Michaela Shankel, OTR/L  [phone] 09/13/2015

## 2015-09-13 NOTE — Evaluation (Signed)
Physical Therapy Evaluation Patient Details Name: Austin Mendez MRN: [id number] DOB: 11-15-1960 Today's Date: 09/13/2015   History of Present Illness  55 y.o. male with medical history significant for poorly controlled HTN, ETOH abuse, presenting to to the ED with nausea and vomiting, diziness with onset about 4 days ago, accompanied by mild epigastric pain without diarrhea,.   Clinical Impression  Patient demonstrates deficits in functional mobility as indicated below. Will need continued skilled PT to address deficits and maximize function. Will see as indicated and progress as tolerated. As of now patient requiring increased 2 person moderate assist for very basic mobility. Will need SNF prior to returning home. Will Follow    Follow Up Recommendations SNF;Supervision/Assistance - 24 hour    Equipment Recommendations  Other (comment) (TBD)    Recommendations for Other Services       Precautions / Restrictions Precautions Precautions: Fall Restrictions Weight Bearing Restrictions: No      Mobility  Bed Mobility Overal bed mobility: Needs Assistance Bed Mobility: Supine to Sit;Sit to Supine     Supine to sit: Supervision Sit to supine: Supervision   General bed mobility comments: Pt assisted wtih scooting up in bed by pulling up on bedreails  Transfers Overall transfer level: Needs assistance Equipment used: 2 person hand held assist Transfers: Sit to/from UGI CorporationStand;Stand Pivot Transfers Sit to Stand: +2 physical assistance;Mod assist Stand pivot transfers: Mod assist;+2 physical assistance          Ambulation/Gait Ambulation/Gait assistance: Mod assist;+2 physical assistance Ambulation Distance (Feet): 16 Feet Assistive device: 2 person hand held assist (2 person wrap around with 3 musketeer approach) Gait Pattern/deviations: Step-through pattern;Decreased stride length;Decreased dorsiflexion - left;Decreased weight shift to left;Shuffle;Trunk flexed;Narrow  base of support Gait velocity: decreased   General Gait Details: patient required increased physical assist for mobility, 2 persons 3 musketeer approach with cues for upright posture.   Stairs            Wheelchair Mobility    Modified Rankin (Stroke Patients Only)       Balance Overall balance assessment: Needs assistance   Sitting balance-Leahy Scale: Fair       Standing balance-Leahy Scale: Poor                               Pertinent Vitals/Pain Pain Assessment: Faces Faces Pain Scale: No hurt    Home Living Family/patient expects to be discharged to:: Unsure                      Prior Function Level of Independence:  (unsure)               Hand Dominance        Extremity/Trunk Assessment   Upper Extremity Assessment: Generalized weakness           Lower Extremity Assessment: Generalized weakness;LLE deficits/detail;Difficult to assess due to impaired cognition      Cervical / Trunk Assessment: Kyphotic  Communication   Communication: Expressive difficulties (dysarthric)  Cognition Arousal/Alertness: Lethargic;Suspect due to medications Behavior During Therapy: Restless;Flat affect;Impulsive Overall Cognitive Status: Impaired/Different from baseline Area of Impairment: Orientation;Attention;Memory;Following commands;Safety/judgement;Awareness;Problem solving Orientation Level: Disoriented to;Place;Time;Situation Current Attention Level: Sustained   Following Commands: Follows one step commands with increased time Safety/Judgement: Decreased awareness of safety;Decreased awareness of deficits Awareness: Intellectual Problem Solving: Slow processing;Decreased initiation;Difficulty sequencing;Requires verbal cues      General Comments      Exercises  Assessment/Plan    PT Assessment Patient needs continued PT services  PT Diagnosis Difficulty walking;Abnormality of gait;Generalized weakness;Altered  mental status   PT Problem List Decreased strength;Decreased activity tolerance;Decreased balance;Decreased mobility;Decreased coordination;Decreased cognition;Decreased safety awareness  PT Treatment Interventions DME instruction;Gait training;Functional mobility training;Therapeutic activities;Therapeutic exercise;Balance training;Cognitive remediation;Patient/family education   PT Goals (Current goals can be found in the Care Plan section) Acute Rehab PT Goals Patient Stated Goal: to go home PT Goal Formulation: Patient unable to participate in goal setting Time For Goal Achievement: 09/27/15 Potential to Achieve Goals: Fair    Frequency Min 3X/week   Barriers to discharge        Co-evaluation PT/OT/SLP Co-Evaluation/Treatment: Yes Reason for Co-Treatment: Necessary to address cognition/behavior during functional activity;For patient/therapist safety PT goals addressed during session: Mobility/safety with mobility OT goals addressed during session: ADL's and self-care       End of Session Equipment Utilized During Treatment: Gait belt Activity Tolerance: Patient limited by fatigue;Other (comment) (AMS) Patient left: in bed;with call bell/phone within reach;with bed alarm set;with restraints reapplied;with SCD's reapplied Nurse Communication: Mobility status         Time: [id number] PT Time Calculation (min) (ACUTE ONLY): 26 min   Charges:   PT Evaluation $PT Eval Moderate Complexity: 1 Procedure     PT G CodesFabio Asa:        Hugh Kamara J 09/13/2015, 9:49 AM  Charlotte Crumbevon Zenia Guest, PT DPT  [phone]

## 2015-09-13 NOTE — Progress Notes (Signed)
   09/13/15 0018  Vitals  Temp 97.5 F (36.4 C)  Temp Source Axillary  BP (!) 200/105 mmHg  MAP (mmHg) 130  BP Location Right Arm  BP Method Automatic  Patient Position (if appropriate) Lying  Pulse Rate 66  Pulse Rate Source Monitor  ECG Heart Rate 67  Cardiac Rhythm NSR  Resp (!) 24  pt refused his 2200 blood pressure med's and his midnight hydralazine, gave prn IV hydralazine pt states that i am trying to give him" crazy pills" I explained the importance of his BP meds pt still refuses.

## 2015-09-13 NOTE — Progress Notes (Signed)
PROGRESS NOTE    Austin Mendez  [id number] DOB: [birth year] DOA: 09/07/2015 PCP: Billee CashingMCKENZIE, WAYLAND, MD   Brief Narrative:  55 y.o. BM PMHx poorly controlled HTN, ETOH abuse, Substance Abuse (at admission admitted to beer 540oz/dy), Syncope and collapse,  Presenting to to the ED on 6/16 with 4 d h/o nausea and vomiting, diziness,.  No history of withrawals in the past. He was dehydrated, given IVF in the Er. He also was hypokalemic, hypomagnesemic and his BBP was very elevated requiring IV antihypertensive. At the ED, electrolytes and IVF were replenished. He was to ambulate to the bathroom, But became very dizzy for which it felt prudent to admit management of symptoms. He was admitted to the SDU setting. Became progressively restless and agitated. CIWA protocol started for w/d. He had received total of 16 mg ativan, then 12 mg the following day, and on 6/17 only 4 mg. PCCM was asked to see for agitation & concern for w/d   Assessment & Plan:   Principal Problem:   Malignant hypertension Active Problems:   Alcohol abuse   Essential hypertension   Syncope and collapse   Hypokalemia   Acute kidney injury (HCC)   Hypomagnesemia   Epigastric pain   ETOH abuse   Uncontrolled hypertension   Noncompliance with treatment   Tobacco abuse   AKI (acute kidney injury) (HCC)   Altered mental status  Malignant Hypertension/Noncompliance medication -Per admitting note BP has been running in the 170's/110's for at least 1 year.  -Troponin negative  -Clonidine patch 0.2 TID -Hydralazine 25 mgQID -Metoprolol 50 mg BID  Dizziness with presyncopal episode likely due to dehydration in conjunction with ETOH withdrawal  -CT head negative  -Currently withdrawing not an issue -2 D echo in 2014 CHF with Gr 1 DD;-6/20 Echocardiogram; normal ejection fraction see results  Dysarthria/AMS -Patient appears to have worsening dysarthria and increasing periods of delirium -MRI brain  pending  Alcohol abuse/Withdrawal  -Per admission note, prior to admission Last drink 4 days ago. - CIWA  Protocol;  -Thiamine, folate, MVI per protocol -UDS positive benzodiazepine; expected patient on benzos   Tobacco abuse  - Nicotine patch  - Counseled cessation  CKD, +/- Acute Kidney Injury; unknown baseline (admission Cr 1.3)  -Strict I/O since admission +56715ml -Daily weights Filed Weights   09/11/15 0630 09/12/15 0500 09/13/15 0438  Weight: 62.9 kg (138 lb 10.7 oz) 65 kg (143 lb 4.8 oz) 62.8 kg (138 lb 7.2 oz)  -Resolved  Hypoglycemia -CBG q 4hr -See CKD  Hypomagnesemia -Magnesium IV 3 gm  Hypokalemia -Resolved    DVT prophylaxis: Subcutaneous heparin Code Status: Full Family Communication: None Disposition Plan: Resolution alcohol withdrawal   Consultants:  Dr. Alyson ReedyWesam G Yacoub PCCM     Procedures/Significant Events:  6/16 CT head: Atrophy with extensive supratentorial small vessel disease. --No acute infarct evident 6/20 echocardiogram;Left ventricle: moderate concentric hypertrophy.--LVEF= 60% to 65%. - (grade 1 diastolic dysfunction).    Cultures NA  Antimicrobials: NA   Devices NA   LINES / TUBES:  NA    Continuous Infusions:     Subjective: 6/22 A/O  3 (does not know where).    Objective: Filed Vitals:   09/13/15 0438 09/13/15 0700 09/13/15 1100 09/13/15 1511  BP: 154/91 159/96 136/83 163/89  Pulse: 72 60 65 50  Temp: 97.6 F (36.4 C) 97.5 F (36.4 C) 97.4 F (36.3 C) 97.6 F (36.4 C)  TempSrc: Axillary Oral Oral Axillary  Resp: 21 20 28  32  Height:   (1.676 m)     Weight: 62.8 kg (138 lb 7.2 oz)     SpO2: 100% 100% 100% 97%    Intake/Output Summary (Last 24 hours) at 09/13/15 1903 Last data filed at 09/13/15 1609  Gross per 24 hour  Intake    360 ml  Output   2050 ml  Net  -1690 ml   Filed Weights   09/11/15 0630 09/12/15 0500 09/13/15 0438  Weight: 62.9 kg (138 lb 10.7 oz) 65 kg (143 lb 4.8 oz)  62.8 kg (138 lb 7.2 oz)    Examination:  General: A/O 3 (does not know Where), NAD, No acute respiratory distress Eyes: negative scleral hemorrhage, negative anisocoria, negative icterus ENT: Negative Runny nose, negative gingival bleeding, Neck:  Negative scars, masses, torticollis, lymphadenopathy, JVD Lungs: Clear to auscultation bilaterally without wheezes or crackles Cardiovascular: Regular rate and rhythm without murmur gallop or rub normal S1 and S2 Abdomen: negative abdominal pain, nondistended, positive soft, bowel sounds, no rebound, no ascites, no appreciable mass Extremities: No significant cyanosis, clubbing, or edema bilateral lower extremities Skin: Negative rashes, lesions, ulcers Psychiatric: Unable to assess; patient appears confused but pleasant Central nervous system:  Cranial nerves II through XII intact, tongue/uvula midline, all extremities muscle strength 5/5, sensation intact throughout, positive dysarthria, negative expressive aphasia, negative receptive aphasia. Follows all commands   .     Data Reviewed: Care during the described time interval was provided by me .  I have reviewed this patient's available data, including medical history, events of note, physical examination, and all test results as part of my evaluation. I have personally reviewed and interpreted all radiology studies.  CBC:  Recent Labs Lab 09/07/15 0600 09/08/15 0400 09/08/15 1641 09/11/15 0434  WBC 5.5 3.8* 7.8 7.9  NEUTROABS 3.3  --   --   --   HGB 14.6 11.7* 12.8* 12.2*  HCT 41.4 34.6* 37.4* 37.0*  MCV 90.8 92.3 92.3 95.4  PLT 221 180 198 179   Basic Metabolic Panel:  Recent Labs Lab 09/07/15 0722 09/08/15 0400 09/08/15 1641 09/10/15 0245 09/11/15 0434 09/13/15 0527  NA  --  135 135 139 137 136  K  --  3.4* 3.5 3.1* 3.7 3.8  CL  --  100* 100* 107 107 107  CO2  --  GLUCOSE  --  98 92 121* 91 97  BUN  --  CREATININE  --  1.49* 1.46*  1.18 1.08 0.89  CALCIUM  --  9.3 9.3 8.8* 9.0 9.4  MG 1.5*  --  1.8 1.2* 1.6* 1.3*  PHOS  --   --   --   --  2.5  --    GFR: Estimated Creatinine Clearance: 83.3 mL/min (by C-G formula based on Cr of 0.89). Liver Function Tests:  Recent Labs Lab 09/07/15 0600 09/08/15 0400 09/11/15 0434  AST 150* 108* 58*  ALT 90* 71* 45  ALKPHOS 102 75 67  BILITOT 1.9* 0.7 0.9  PROT 8.7* 7.0 6.8  ALBUMIN 3.6 2.9* 2.6*    Recent Labs Lab 09/07/15 0600  LIPASE 25   No results for input(s): AMMONIA in the last 168 hours. Coagulation Profile:  Recent Labs Lab 09/07/15 1456  INR 1.12   Cardiac Enzymes:  Recent Labs Lab 09/07/15 0600  TROPONINI <0.03   BNP (last 3 results) No results for input(s): PROBNP in the last 8760 hours. HbA1C: No results for input(s): HGBA1C in  the last 72 hours. CBG:  Recent Labs Lab 09/11/15 0827 09/11/15 1128 09/11/15 1619 09/11/15 2112 09/12/15 0759  GLUCAP 76 108* 102* 120* 91   Lipid Profile: No results for input(s): CHOL, HDL, LDLCALC, TRIG, CHOLHDL, LDLDIRECT in the last 72 hours. Thyroid Function Tests: No results for input(s): TSH, T4TOTAL, FREET4, T3FREE, THYROIDAB in the last 72 hours. Anemia Panel:  Recent Labs  09/13/15 0527  VITAMINB12 320  FOLATE 17.4   Urine analysis:    Component Value Date/Time   COLORURINE AMBER* 09/07/2015 0806   APPEARANCEUR CLEAR 09/07/2015 0806   LABSPEC 1.015 09/07/2015 0806   PHURINE 5.5 09/07/2015 0806   GLUCOSEU NEGATIVE 09/07/2015 0806   HGBUR NEGATIVE 09/07/2015 0806   BILIRUBINUR SMALL* 09/07/2015 0806   KETONESUR 15* 09/07/2015 0806   PROTEINUR 100* 09/07/2015 0806   UROBILINOGEN 2.0* 04/28/2013 1602   NITRITE NEGATIVE 09/07/2015 0806   LEUKOCYTESUR NEGATIVE 09/07/2015 0806   Sepsis Labs: @LABRCNTIP (procalcitonin:4,lacticidven:4)  ) Recent Results (from the past 240 hour(s))  MRSA PCR Screening     Status: None   Collection Time: 09/07/15  5:12 PM  Result Value Ref Range  Status   MRSA by PCR NEGATIVE NEGATIVE Final    Comment:        The GeneXpert MRSA Assay (FDA approved for NASAL specimens only), is one component of a comprehensive MRSA colonization surveillance program. It is not intended to diagnose MRSA infection nor to guide or monitor treatment for MRSA infections.          Radiology Studies: No results found.      Scheduled Meds: . antiseptic oral rinse  7 mL Mouth Rinse q12n4p  . chlorhexidine  15 mL Mouth Rinse BID  . cloNIDine  0.2 mg Oral TID  . feeding supplement (ENSURE ENLIVE)  237 mL Oral BID BM  . folic acid  1 mg Oral Daily  . heparin  5,000 Units Subcutaneous Q8H  . hydrALAZINE  25 mg Oral Q6H  . metoprolol tartrate  50 mg Oral BID  . multivitamin with minerals  1 tablet Oral Daily  . nicotine  21 mg Transdermal Daily  . potassium chloride  40 mEq Oral Daily  . thiamine  100 mg Oral Daily   Continuous Infusions:     LOS: 6 days    Time spent: 40 minutes    WOODS, Roselind MessierURTIS J, MD [facility] Pager [phone]   If 7PM-7AM, please contact night-coverage [email or web address] Password [email or web address] 09/13/2015, 7:03 PM

## 2015-09-13 NOTE — Clinical Social Work Note (Addendum)
CSW called patient's wife for assessment to discuss SNF and discharge planning. Patient has Medicaid and they do not pay for therapies. Left voicemail with contact information.  Charlynn CourtSarah Norine Reddington, CSW [phone]  12:25 pm Patient's wife called back. Discussed SNF recommendation. Patient's wife unable to pay out of pocket for PT. She is interested in home health. Someone will be at the home with him 24/7. Patient lives at home with wife and son. Patient's wife feels comfortable with having to help him get around the house. RNCM notified.  Charlynn CourtSarah Aadil Sur, CSW [phone]  3:48 pm CSW called and left another voicemail for patient's sister about her concerns regarding his home life.  Charlynn CourtSarah Miller Limehouse, CSW [phone]

## 2015-09-13 NOTE — Progress Notes (Signed)
Notified that Patients HR dropped into the 20's and sustained for a few seconds before coming back up into the 70's.  Difficult to assess if patient experienced any symptoms during the episode due to patients slurred speech.  Assessed patients pupils and patients left pupil is no longer responsive at this time.  Contacted physician and received an order for an MRI.  Will continue to monitor.

## 2015-09-14 ENCOUNTER — Encounter (HOSPITAL_COMMUNITY)
Admission: EM | Disposition: A | Discharge status: Home / Self Care | Payer: Self-pay | Source: Home / Self Care | Attending: Internal Medicine

## 2015-09-14 ENCOUNTER — Inpatient Hospital Stay (HOSPITAL_COMMUNITY): Payer: Medicaid Other

## 2015-09-14 ENCOUNTER — Encounter (HOSPITAL_COMMUNITY): Payer: Self-pay | Admitting: Certified Registered Nurse Anesthetist

## 2015-09-14 SURGERY — RADIOLOGY WITH ANESTHESIA
Anesthesia: General

## 2015-09-14 MED ORDER — MAGNESIUM SULFATE 4 GM/100ML IV SOLN
4.0000 g | Freq: Once | INTRAVENOUS | Status: AC
  Administered 2015-09-14: 4 g via INTRAVENOUS
  Filled 2015-09-14: qty 100

## 2015-09-14 MED ORDER — LACTATED RINGERS IV SOLN
INTRAVENOUS | Status: DC
  Administered 2015-09-14: 08:00:00 via INTRAVENOUS

## 2015-09-14 MED ORDER — HYDRALAZINE HCL 50 MG PO TABS
50.0000 mg | ORAL_TABLET | Freq: Three times a day (TID) | ORAL | Status: DC
  Administered 2015-09-14 – 2015-09-15 (×2): 50 mg via ORAL
  Filled 2015-09-14 (×3): qty 1

## 2015-09-14 MED ORDER — CLONIDINE HCL 0.2 MG PO TABS
0.3000 mg | ORAL_TABLET | Freq: Three times a day (TID) | ORAL | Status: DC
  Administered 2015-09-14 – 2015-09-21 (×20): 0.3 mg via ORAL
  Filled 2015-09-14 (×20): qty 1

## 2015-09-14 NOTE — Progress Notes (Signed)
[place] TEAM 1 - Stepdown/ICU TEAM  Willa RoughWilliam D Crean  [id number] DOB: 1960/12/13 DOA: 09/07/2015 PCP: Billee CashingMCKENZIE, WAYLAND, MD    Brief Narrative:  55yo M Hx poorly controlled HTN, ETOH abuse, Substance Abuse, Alcoholism, and Syncope who presented to the ED on 6/16 with 4 d h/o nausea and vomiting with dizziness.  He was hypokalemic, hypomagnesemic, and his BP was elevated requiring IV antihypertensive.   Following admission he became progressively restless and agitated. CIWA protocol was utilized. He received a total of 16 mg ativan in the first 24hrs, 12 mg the following day, and on 6/17 only 4 mg.  Subjective: The pt is the best I have seen him today.  He is alert and conversant.  He can tell me where he is, and why, but he does no know the correct year.  He denies cp, sob, n/v, or abdom pain.    Assessment & Plan:  Alcohol Withdrawal - Alcoholism Has waxed and waned significantly over the past 48 hours with periods of lucidity mixed with periods of severe agitation - appears to be stabilizing again now - follow  Hypomagnesemia Persistent - cont to replace and follow - anticipate signif total body deficit  Hypokalemia Much improved - cont to replace at low dose and follow   Malignant Hypertension  A long standing issue complicated by noncompliance w/ medications - blood pressure remains erratic but overall trend is that of improvement   Dizziness with presyncopal episode due to dehydration in conjunction with ETOH withdrawal - CT head negative - TTE notes preserved EF w/ no WMA and 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 - of note patient has a cataract on the left and therefore chronic minimally reactive pupil  Tobacco abuse  Nicotine patch  Possible Acute Kidney Injury unknown baseline - admission Cr 1.3 - crt has normalized   Hypoglycemia Appears to have resolved   DVT prophylaxis: SQ heparin Code Status: FULL CODE Family Communication: no family  present at time of exam  Disposition Plan: Continue to monitor in step down unit given volatility of mental status  Consultants:  PCCM  Procedures: TTE - 6/20 - see results above   Antimicrobials:  none   Objective: Blood pressure 133/75, pulse 78, temperature 98.3 F (36.8 C), temperature source Oral, resp. rate 25, height 5\' 6"  (1.676 m), weight 62 kg (136 lb 11 oz), SpO2 97 %.  Intake/Output Summary (Last 24 hours) at 09/14/15 1606 Last data filed at 09/14/15 1556  Gross per 24 hour  Intake    480 ml  Output   1700 ml  Net  -1220 ml   Filed Weights   09/12/15 0500 09/13/15 0438 09/14/15 0500  Weight: 65 kg (143 lb 4.8 oz) 62.8 kg (138 lb 7.2 oz) 62 kg (136 lb 11 oz)    Examination: General: No acute respiratory distress - alert and pleasant  Lungs: Clear to auscultation bilaterally Cardiovascular: Regular rate and rhythm without murmur  Abdomen: Nontender, nondistended, soft, bowel sounds positive, no rebound, no ascites, no appreciable mass Extremities: No significant cyanosis, clubbing, or edema bilateral lower extremities  CBC:  Recent Labs Lab 09/08/15 0400 09/08/15 1641 09/11/15 0434  WBC 3.8* 7.8 7.9  HGB 11.7* 12.8* 12.2*  HCT 34.6* 37.4* 37.0*  MCV 92.3 92.3 95.4  PLT 180 198 179   Basic Metabolic Panel:  Recent Labs Lab 09/08/15 0400 09/08/15 1641 09/10/15 0245 09/11/15 0434 09/13/15 0527  NA 135 135 139 137 136  K 3.4* 3.5  3.1* 3.7 3.8  CL 100* 100* 107 107 107  CO2 28 28 23 23 22   GLUCOSE 98 92 121* 91 97  BUN 16 16 12 14 8   CREATININE 1.49* 1.46* 1.18 1.08 0.89  CALCIUM 9.3 9.3 8.8* 9.0 9.4  MG  --  1.8 1.2* 1.6* 1.3*  PHOS  --   --   --  2.5  --    GFR: Estimated Creatinine Clearance: 82.2 mL/min (by C-G formula based on Cr of 0.89).  Liver Function Tests:  Recent Labs Lab 09/08/15 0400 09/11/15 0434  AST 108* 58*  ALT 71* 45  ALKPHOS 75 67  BILITOT 0.7 0.9  PROT 7.0 6.8  ALBUMIN 2.9* 2.6*    HbA1C: HGB A1C MFR BLD    Date/Time Value Ref Range Status  09/07/2015 02:56 PM 4.8 4.8 - 5.6 % Final    Comment:    (NOTE)         Pre-diabetes: 5.7 - 6.4         Diabetes: >6.4         Glycemic control for adults with diabetes: <7.0   10/17/2009 06:11 PM  <5.7 % Final   5.0 (NOTE)                                                                       According to the ADA Clinical Practice Recommendations for 2011, when HbA1c is used as a screening test:   >=6.5%   Diagnostic of Diabetes Mellitus           (if abnormal result  is confirmed)  5.7-6.4%   Increased risk of developing Diabetes Mellitus  References:Diagnosis and Classification of Diabetes Mellitus,Diabetes Care,2011,34(Suppl 1):S62-S69 and Standards of Medical Care in         Diabetes - 2011,Diabetes Care,2011,34  (Suppl 1):S11-S61.    CBG:  Recent Labs Lab 09/11/15 0827 09/11/15 1128 09/11/15 1619 09/11/15 2112 09/12/15 0759  GLUCAP 76 108* 102* 120* 91    Recent Results (from the past 240 hour(s))  MRSA PCR Screening     Status: None   Collection Time: 09/07/15  5:12 PM  Result Value Ref Range Status   MRSA by PCR NEGATIVE NEGATIVE Final    Comment:        The GeneXpert MRSA Assay (FDA approved for NASAL specimens only), is one component of a comprehensive MRSA colonization surveillance program. It is not intended to diagnose MRSA infection nor to guide or monitor treatment for MRSA infections.      Scheduled Meds: . antiseptic oral rinse  7 mL Mouth Rinse q12n4p  . chlorhexidine  15 mL Mouth Rinse BID  . cloNIDine  0.2 mg Oral TID  . feeding supplement (ENSURE ENLIVE)  237 mL Oral BID BM  . folic acid  1 mg Oral Daily  . heparin  5,000 Units Subcutaneous Q8H  . hydrALAZINE  25 mg Oral Q6H  . metoprolol tartrate  50 mg Oral BID  . multivitamin with minerals  1 tablet Oral Daily  . nicotine  21 mg Transdermal Daily  . potassium chloride  40 mEq Oral Daily  . thiamine  100 mg Oral Daily   Continuous Infusions: .  lactated ringers 50 mL/hr at 09/14/15 0750  LOS: 7 days   Time spent: 35 minutes   Lonia Blood, MD [facility] Office  [phone] Pager - Text Page per Amion as per below:  On-Call/Text Page:      [email or web address]      password [email or web address]  If 7PM-7AM, please contact night-coverage [email or web address] Password [facility] 09/14/2015, 4:06 PM

## 2015-09-14 NOTE — Progress Notes (Signed)
NCM message to attending for hospital course and dc plan. Wife is willing to dc home with HH. Pt will have out of pocket cost for SNF. Will continue to evaluate for appropriateness for IP rehab. Isidoro DonningAlesia Brittnee Gaetano RN CCM Case Mgmt phone [phone]

## 2015-09-15 ENCOUNTER — Inpatient Hospital Stay (HOSPITAL_COMMUNITY): Payer: Medicaid Other

## 2015-09-15 LAB — MAGNESIUM: Magnesium: 2 mg/dL (ref 1.7–2.4)

## 2015-09-15 LAB — BASIC METABOLIC PANEL
ANION GAP: 8 (ref 5–15)
BUN: 14 mg/dL (ref 6–20)
CO2: 22 mmol/L (ref 22–32)
Calcium: 9.2 mg/dL (ref 8.9–10.3)
Chloride: 102 mmol/L (ref 101–111)
Creatinine, Ser: 1.12 mg/dL (ref 0.61–1.24)
Glucose, Bld: 91 mg/dL (ref 65–99)
POTASSIUM: 3.7 mmol/L (ref 3.5–5.1)
Sodium: 132 mmol/L — ABNORMAL LOW (ref 135–145)

## 2015-09-15 MED ORDER — HALOPERIDOL LACTATE 5 MG/ML IJ SOLN
5.0000 mg | Freq: Four times a day (QID) | INTRAMUSCULAR | Status: DC | PRN
  Administered 2015-09-15: 5 mg via INTRAVENOUS
  Filled 2015-09-15 (×2): qty 1

## 2015-09-15 MED ORDER — METOPROLOL TARTRATE 100 MG PO TABS
100.0000 mg | ORAL_TABLET | Freq: Two times a day (BID) | ORAL | Status: DC
  Administered 2015-09-15 – 2015-09-19 (×8): 100 mg via ORAL
  Filled 2015-09-15 (×8): qty 1

## 2015-09-15 MED ORDER — LORAZEPAM 2 MG/ML IJ SOLN
2.0000 mg | INTRAMUSCULAR | Status: DC | PRN
  Filled 2015-09-15: qty 1

## 2015-09-15 MED ORDER — HYDRALAZINE HCL 50 MG PO TABS
100.0000 mg | ORAL_TABLET | Freq: Three times a day (TID) | ORAL | Status: DC
  Administered 2015-09-15 – 2015-09-21 (×17): 100 mg via ORAL
  Filled 2015-09-15 (×17): qty 2

## 2015-09-15 NOTE — Progress Notes (Signed)
Report given to Kensington Parkindy, RN for 5 W room 5.

## 2015-09-15 NOTE — Progress Notes (Signed)
[place] TEAM 1 - Stepdown/ICU TEAM  Willa RoughWilliam D Keelan  WNU:[id number] DOB: 1961-01-04 DOA: 09/07/2015 PCP: Billee CashingMCKENZIE, WAYLAND, MD    Brief Narrative:  55yo M Hx poorly controlled HTN, ETOH abuse, Substance Abuse, Alcoholism, and Syncope who presented to the ED on 6/16 with 4 d h/o nausea and vomiting with dizziness.  He was hypokalemic, hypomagnesemic, and his BP was elevated requiring IV antihypertensive.   Following admission he became progressively restless and agitated. CIWA protocol was utilized. He received a total of 16 mg ativan in the first 24hrs, 12 mg the following day, and on 6/17 only 4 mg.  Subjective: The pt is alert and conversant and calm.  He is very hungry.  He denies cp, sob, n/v, or abdom pain.    Assessment & Plan:  Protracted Alcohol Withdrawal - Alcoholism Has waxed and waned significantly over the hospitalization with periods of lucidity mixed with periods of severe agitation - appears to be stable at this time   Hypomagnesemia Required multiple doses to correct but now appears to be stable  Hypokalemia Required multiple doses to correct but now appears to be stable  Malignant Hypertension  A long standing issue complicated by noncompliance w/ medications - blood pressure remains erratic but overall trend is that of improvement - titrate meds further today and follow  Dizziness with presyncopal episode due to dehydration in conjunction with ETOH withdrawal - CT head negative - TTE notes preserved EF w/ no WMA and grade 1 DD w/ some evidence of HOCM physiology - of note patient has a cataract on the left and therefore chronic minimally reactive pupil  Tobacco abuse  Nicotine patch  Possible Acute Kidney Injury unknown baseline - admission Cr 1.3 - crt continues to fluctuate but appears to be stable at 0.9-1.2  Hypoglycemia resolved   DVT prophylaxis: SQ heparin Code Status: FULL CODE Family Communication: no family present at time of exam    Disposition Plan: Transfer to medical bed - PT/OT following - may require SNF placement for rehabilitation if does not improve over next 24-48 hours  Consultants:  PCCM  Procedures: TTE - 6/20 - see results above   Antimicrobials:  none   Objective: Blood pressure 163/88, pulse 74, temperature 99.1 F (37.3 C), temperature source Oral, resp. rate 18, height 5\' 6"  (1.676 m), weight 65 kg (143 lb 4.8 oz), SpO2 99 %.  Intake/Output Summary (Last 24 hours) at 09/15/15 1157 Last data filed at 09/15/15 1126  Gross per 24 hour  Intake 2118.33 ml  Output   2125 ml  Net  -6.67 ml   Filed Weights   09/13/15 0438 09/14/15 0500 09/15/15 0312  Weight: 62.8 kg (138 lb 7.2 oz) 62 kg (136 lb 11 oz) 65 kg (143 lb 4.8 oz)    Examination: General: No acute respiratory distress - alert/pleasant  Lungs: Clear to auscultation bilaterally - no wheeze  Cardiovascular: Regular rate and rhythm without murmur or gallup  Abdomen: Nontender, nondistended, soft, bowel sounds positive, no rebound, no ascites, no appreciable mass Extremities: No significant cyanosis, clubbing, edema bilateral lower extremities  CBC:  Recent Labs Lab 09/08/15 1641 09/11/15 0434  WBC 7.8 7.9  HGB 12.8* 12.2*  HCT 37.4* 37.0*  MCV 92.3 95.4  PLT 198 179   Basic Metabolic Panel:  Recent Labs Lab 09/08/15 1641 09/10/15 0245 09/11/15 0434 09/13/15 0527 09/15/15 0418  NA 135 139 137 136 132*  K 3.5 3.1* 3.7 3.8 3.7  CL 100* 107 107 107 102  CO2 GLUCOSE 92 121* 91 97 91  BUN CREATININE 1.46* 1.18 1.08 0.89 1.12  CALCIUM 9.3 8.8* 9.0 9.4 9.2  MG 1.8 1.2* 1.6* 1.3* 2.0  PHOS  --   --  2.5  --   --    GFR: Estimated Creatinine Clearance: 67.2 mL/min (by C-G formula based on Cr of 1.12).  Liver Function Tests:  Recent Labs Lab 09/11/15 0434  AST 58*  ALT 45  ALKPHOS 67  BILITOT 0.9  PROT 6.8  ALBUMIN 2.6*    HbA1C: HGB A1C MFR BLD  Date/Time Value Ref Range  Status  09/07/2015 02:56 PM 4.8 4.8 - 5.6 % Final    Comment:    (NOTE)         Pre-diabetes: 5.7 - 6.4         Diabetes: >6.4         Glycemic control for adults with diabetes: <7.0   10/17/2009 06:11 PM  <5.7 % Final   5.0 (NOTE)                                                                       According to the ADA Clinical Practice Recommendations for 2011, when HbA1c is used as a screening test:   >=6.5%   Diagnostic of Diabetes Mellitus           (if abnormal result  is confirmed)  5.7-6.4%   Increased risk of developing Diabetes Mellitus  References:Diagnosis and Classification of Diabetes Mellitus,Diabetes Care,2011,34(Suppl 1):S62-S69 and Standards of Medical Care in         Diabetes - 2011,Diabetes Care,2011,34  (Suppl 1):S11-S61.    CBG:  Recent Labs Lab 09/11/15 0827 09/11/15 1128 09/11/15 1619 09/11/15 2112 09/12/15 0759  GLUCAP 76 108* 102* 120* 91    Recent Results (from the past 240 hour(s))  MRSA PCR Screening     Status: None   Collection Time: 09/07/15  5:12 PM  Result Value Ref Range Status   MRSA by PCR NEGATIVE NEGATIVE Final    Comment:        The GeneXpert MRSA Assay (FDA approved for NASAL specimens only), is one component of a comprehensive MRSA colonization surveillance program. It is not intended to diagnose MRSA infection nor to guide or monitor treatment for MRSA infections.      Scheduled Meds: . antiseptic oral rinse  7 mL Mouth Rinse q12n4p  . chlorhexidine  15 mL Mouth Rinse BID  . cloNIDine  0.3 mg Oral TID  . feeding supplement (ENSURE ENLIVE)  237 mL Oral BID BM  . folic acid  1 mg Oral Daily  . heparin  5,000 Units Subcutaneous Q8H  . hydrALAZINE  50 mg Oral Q8H  . metoprolol tartrate  50 mg Oral BID  . multivitamin with minerals  1 tablet Oral Daily  . nicotine  21 mg Transdermal Daily  . potassium chloride  40 mEq Oral Daily  . thiamine  100 mg Oral Daily   Continuous Infusions: . lactated ringers 10 mL/hr at  09/15/15 0300     LOS: 8 days   Time spent: 25 minutes   Lonia Blood, MD [facility] Office  [phone] Pager - Text Page per Loretha StaplerAmion as per below:  On-Call/Text Page:      [email or web address]      password [email or web address]  If 7PM-7AM, please contact night-coverage [email or web address] Password [email or web address] 09/15/2015, 11:57 AM

## 2015-09-15 NOTE — Progress Notes (Signed)
Pt has become very agitated and anxious, requesting this nurse call the ambulance. Pt appeared to be SOB. O2 96% RA. Pt inititally was able to be calmed down, then became increasingly agitated and attempting to get out of bed. Ativan given per order around 1745. Pt was being moved to camera room for closer observation and again attempted to get of bed. Pt is now in room and laying in bed. Posey belt reapplied and new catheter applied. MD was contacted and new order placed for haldol. Nurse attempted to administer med at 1900, but pt became agitated with attempt and med was not given.

## 2015-09-15 NOTE — Progress Notes (Signed)
Austin Mendez [id number]  Transfer Data: 09/15/2015 2:32 PM  Attending Provider: Lonia BloodJeffrey T McClung, MD  WUJ:[id number]:MCKENZIE, Adela LankWAYLAND, MD  Code Status: Full  Austin Mendez is a 55 y.o. male patient transferred from 3S3  -No acute distress noted.  -No complaints of shortness of breath.  -No complaints of chest pain.    Blood pressure 156/89, pulse 74, temperature 99.1 F (37.3 C), temperature source Oral, resp. rate 21, height 5\' 6"  (1.676 m), weight 65 kg (143 lb 4.8 oz), SpO2 97 %.  ?  IV Fluids: IV in place, occlusive dsg intact without redness, IV cath forearm left, condition patent and no redness  none.  Allergies: Review of patient's allergies indicates no known allergies.  Past Medical History:  has a past medical history of Hypertension; Syncope and collapse; Dizziness and giddiness; Shortness of breath; and Unspecified essential hypertension.  Past Surgical History:  has no past surgical history on file.  Social History:  reports that he has been smoking Cigarettes.  He has been smoking about 10.00 packs per day. He does not have any smokeless tobacco history on file. He reports that he drinks alcohol. He reports that he uses illicit drugs (Marijuana).  Skin: intact   Patient/Family orientated to room. Information packet given to patient/family. Admission inpatient armband information verified with patient/family to include name and date of birth and placed on patient arm. Side rails up x 2, fall assessment and education completed with patient/family. Patient/family able to verbalize understanding of risk associated with falls and verbalized understanding to call for assistance before getting out of bed. Call light within reach. Patient/family able to voice and demonstrate understanding of unit orientation instructions.  Posey belt safely secured Will continue to evaluate and treat per MD orders.

## 2015-09-16 LAB — BASIC METABOLIC PANEL
Anion gap: 9 (ref 5–15)
BUN: 21 mg/dL — AB (ref 6–20)
CHLORIDE: 98 mmol/L — AB (ref 101–111)
CO2: 22 mmol/L (ref 22–32)
Calcium: 9.1 mg/dL (ref 8.9–10.3)
Creatinine, Ser: 1.48 mg/dL — ABNORMAL HIGH (ref 0.61–1.24)
GFR calc non Af Amer: 52 mL/min — ABNORMAL LOW (ref >60)
GFR, EST AFRICAN AMERICAN: 60 mL/min — AB (ref >60)
Glucose, Bld: 121 mg/dL — ABNORMAL HIGH (ref 65–99)
POTASSIUM: 3.8 mmol/L (ref 3.5–5.1)
SODIUM: 129 mmol/L — AB (ref 135–145)

## 2015-09-16 LAB — MAGNESIUM: Magnesium: 1.3 mg/dL — ABNORMAL LOW (ref 1.7–2.4)

## 2015-09-16 LAB — URINE MICROSCOPIC-ADD ON

## 2015-09-16 LAB — URINALYSIS, ROUTINE W REFLEX MICROSCOPIC
BILIRUBIN URINE: NEGATIVE
Glucose, UA: NEGATIVE mg/dL
Hgb urine dipstick: NEGATIVE
KETONES UR: NEGATIVE mg/dL
NITRITE: POSITIVE — AB
Protein, ur: NEGATIVE mg/dL
Specific Gravity, Urine: 1.014 (ref 1.005–1.030)
pH: 5 (ref 5.0–8.0)

## 2015-09-16 LAB — LACTIC ACID, PLASMA
Lactic Acid, Venous: 0.9 mmol/L (ref 0.5–2.0)
Lactic Acid, Venous: 1.5 mmol/L (ref 0.5–2.0)

## 2015-09-16 MED ORDER — MAGNESIUM SULFATE 50 % IJ SOLN
3.0000 g | Freq: Once | INTRAMUSCULAR | Status: AC
  Administered 2015-09-16: 3 g via INTRAVENOUS
  Filled 2015-09-16: qty 6

## 2015-09-16 MED ORDER — SODIUM CHLORIDE 0.9 % IV SOLN
INTRAVENOUS | Status: DC
  Administered 2015-09-16: 18:00:00 via INTRAVENOUS
  Administered 2015-09-17: 75 mL via INTRAVENOUS
  Administered 2015-09-17: 21:00:00 via INTRAVENOUS

## 2015-09-16 MED ORDER — MAGNESIUM OXIDE 400 (241.3 MG) MG PO TABS
400.0000 mg | ORAL_TABLET | Freq: Two times a day (BID) | ORAL | Status: DC
  Administered 2015-09-16 – 2015-09-21 (×10): 400 mg via ORAL
  Filled 2015-09-16 (×10): qty 1

## 2015-09-16 NOTE — Progress Notes (Signed)
Austin Mendez  [id number] DOB: 06/29/1960 DOA: 09/07/2015 PCP: Billee CashingMCKENZIE, WAYLAND, MD    Brief Narrative:  55yo M Hx poorly controlled HTN, ETOH abuse, Substance Abuse, Alcoholism, and Syncope who presented to the ED on 6/16 with 4 d h/o nausea and vomiting with dizziness.  He was hypokalemic, hypomagnesemic, and his BP was elevated requiring IV antihypertensive.   Following admission he became progressively restless and agitated. CIWA protocol was utilized.   Subjective: The pt is alert and conversant and calm.  He denies cp, sob, n/v, or abdom pain.   He wants to go home.   Assessment & Plan:  Protracted Alcohol Withdrawal - Alcoholism Has waxed and waned significantly over the hospitalization with periods of lucidity mixed with periods of severe agitation - appears to be stable at this time,but confused about the dates and people. He wants to go home.   Hypomagnesemia Replete as needed and repeat in am.   Hypokalemia Required multiple doses to correct but now appears to be stable  Malignant Hypertension  A long standing issue complicated by noncompliance w/ medications - much improved.   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 Tobacco abuse  Nicotine patch  Possible Acute Kidney Injury Worsening creatinine of 1.4. Gentle hydration and repeat in am.   Hypoglycemia resolved   DVT prophylaxis: SQ heparin Code Status: FULL CODE Family Communication: no family present at time of exam / called wife over the phone.  Disposition Plan: pending PT eval.  Consultants:  PCCM  Procedures: TTE - 6/20 - see results above   Antimicrobials:  none   Objective: Blood pressure 147/85, pulse 80, temperature 98.8 F (37.1 C), temperature source Oral, resp. rate 18, height 5\' 6"  (1.676 m), weight 65.9 kg (145 lb 4.5  oz), SpO2 97 %.  Intake/Output Summary (Last 24 hours) at 09/16/15 1646 Last data filed at 09/16/15 1549  Gross per 24 hour  Intake    480 ml  Output    800 ml  Net   -320 ml   Filed Weights   09/14/15 0500 09/15/15 0312 09/16/15 0609  Weight: 62 kg (136 lb 11 oz) 65 kg (143 lb 4.8 oz) 65.9 kg (145 lb 4.5 oz)    Examination: General: No acute respiratory distress -  Lungs: Clear to auscultation bilaterally - no wheeze  Cardiovascular: Regular rate and rhythm without murmur or gallup  Abdomen: Nontender, nondistended, soft, bowel sounds positive, no rebound, no ascites, no appreciable mass Extremities: No significant cyanosis, clubbing, edema bilateral lower extremities  CBC:  Recent Labs Lab 09/11/15 0434  WBC 7.9  HGB 12.2*  HCT 37.0*  MCV 95.4  PLT 179   Basic Metabolic Panel:  Recent Labs Lab 09/10/15 0245 09/11/15 0434 09/13/15 0527 09/15/15 0418 09/16/15 0234  NA 139 137 136 132* 129*  K 3.1* 3.7 3.8 3.7 3.8  CL 107 107 107 102 98*  CO2 23 23 22 22 22   GLUCOSE 121* 91 97 91 121*  BUN 12 14 8 14  21*  CREATININE 1.18 1.08 0.89 1.12 1.48*  CALCIUM 8.8* 9.0 9.4 9.2 9.1  MG 1.2* 1.6* 1.3* 2.0 1.3*  PHOS  --  2.5  --   --   --    GFR: Estimated Creatinine Clearance: 50.9 mL/min (by C-G formula based on Cr of 1.48).  Liver Function  Tests:  Recent Labs Lab 09/11/15 0434  AST 58*  ALT 45  ALKPHOS 67  BILITOT 0.9  PROT 6.8  ALBUMIN 2.6*    HbA1C: HGB A1C MFR BLD  Date/Time Value Ref Range Status  09/07/2015 02:56 PM 4.8 4.8 - 5.6 % Final    Comment:    (NOTE)         Pre-diabetes: 5.7 - 6.4         Diabetes: >6.4         Glycemic control for adults with diabetes: <7.0   10/17/2009 06:11 PM  <5.7 % Final   5.0 (NOTE)                                                                       According to the ADA Clinical Practice Recommendations for 2011, when HbA1c is used as a screening test:   >=6.5%   Diagnostic of Diabetes Mellitus            (if abnormal result  is confirmed)  5.7-6.4%   Increased risk of developing Diabetes Mellitus  References:Diagnosis and Classification of Diabetes Mellitus,Diabetes Care,2011,34(Suppl 1):S62-S69 and Standards of Medical Care in         Diabetes - 2011,Diabetes Care,2011,34  (Suppl 1):S11-S61.    CBG:  Recent Labs Lab 09/11/15 0827 09/11/15 1128 09/11/15 1619 09/11/15 2112 09/12/15 0759  GLUCAP 76 108* 102* 120* 91    Recent Results (from the past 240 hour(s))  MRSA PCR Screening     Status: None   Collection Time: 09/07/15  5:12 PM  Result Value Ref Range Status   MRSA by PCR NEGATIVE NEGATIVE Final    Comment:        The GeneXpert MRSA Assay (FDA approved for NASAL specimens only), is one component of a comprehensive MRSA colonization surveillance program. It is not intended to diagnose MRSA infection nor to guide or monitor treatment for MRSA infections.      Scheduled Meds: . antiseptic oral rinse  7 mL Mouth Rinse q12n4p  . chlorhexidine  15 mL Mouth Rinse BID  . cloNIDine  0.3 mg Oral TID  . feeding supplement (ENSURE ENLIVE)  237 mL Oral BID BM  . folic acid  1 mg Oral Daily  . heparin  5,000 Units Subcutaneous Q8H  . hydrALAZINE  100 mg Oral Q8H  . metoprolol tartrate  100 mg Oral BID  . multivitamin with minerals  1 tablet Oral Daily  . nicotine  21 mg Transdermal Daily  . potassium chloride  40 mEq Oral Daily  . thiamine  100 mg Oral Daily   Continuous Infusions:     LOS: 9 days   Time spent: 25 minutes   Kathlen ModyVijaya Maddisyn Hegwood,  MD [facility] Office  [phone] Pager - Text Page per Loretha StaplerAmion as per below:  On-Call/Text Page:      [email or web address]      password [email or web address]  If 7PM-7AM, please contact night-coverage [email or web address] Password [email or web address] 09/16/2015, 4:46 PM

## 2015-09-17 LAB — BASIC METABOLIC PANEL
ANION GAP: 7 (ref 5–15)
BUN: 21 mg/dL — ABNORMAL HIGH (ref 6–20)
CALCIUM: 9 mg/dL (ref 8.9–10.3)
CO2: 24 mmol/L (ref 22–32)
CREATININE: 1.34 mg/dL — AB (ref 0.61–1.24)
Chloride: 98 mmol/L — ABNORMAL LOW (ref 101–111)
GFR, EST NON AFRICAN AMERICAN: 58 mL/min — AB (ref >60)
Glucose, Bld: 115 mg/dL — ABNORMAL HIGH (ref 65–99)
Potassium: 4.1 mmol/L (ref 3.5–5.1)
SODIUM: 129 mmol/L — AB (ref 135–145)

## 2015-09-17 LAB — MAGNESIUM: MAGNESIUM: 1.9 mg/dL (ref 1.7–2.4)

## 2015-09-17 MED ORDER — DEXTROSE 5 % IV SOLN
1.0000 g | INTRAVENOUS | Status: DC
  Administered 2015-09-18: 1 g via INTRAVENOUS
  Filled 2015-09-17: qty 10

## 2015-09-17 MED ORDER — MENTHOL 3 MG MT LOZG
1.0000 | LOZENGE | OROMUCOSAL | Status: DC | PRN
  Administered 2015-09-17: 3 mg via ORAL
  Filled 2015-09-17: qty 9

## 2015-09-17 NOTE — Progress Notes (Signed)
Physical Therapy Treatment Patient Details Name: Austin Mendez MRN: [id number] DOB: [birth year] Today's Date: 09/17/2015    History of Present Illness 55 y.o. male with medical history significant for poorly controlled HTN, ETOH abuse, presenting to to the ED with nausea and vomiting, diziness with onset about 4 days ago, accompanied by mild epigastric pain without diarrhea,.     PT Comments    Pt much improved and steady with gait even with challenges to balance.   Follow Up Recommendations  Home health PT;Supervision for mobility/OOB     Equipment Recommendations  None recommended by PT    Recommendations for Other Services       Precautions / Restrictions Precautions Precautions: Fall Restrictions Weight Bearing Restrictions: No    Mobility  Bed Mobility Overal bed mobility: Modified Independent                Transfers Overall transfer level: Modified independent                  Ambulation/Gait Ambulation/Gait assistance: Supervision Ambulation Distance (Feet): 400 Feet Assistive device: None Gait Pattern/deviations: Step-through pattern   Gait velocity interpretation: at or above normal speed for age/gender General Gait Details: generally steady with gait speed at normal for age levels.  pt managed challenges without overt deviation and no LOB incl stepping over and around obstacles, abrupt changes of direction, backing up, scanning etc.   Stairs Stairs: Yes Stairs assistance: Modified independent (Device/Increase time) Stair Management: One rail Right;Alternating pattern;Forwards Number of Stairs: 5 General stair comments: steady  Wheelchair Mobility    Modified Rankin (Stroke Patients Only)       Balance     Sitting balance-Leahy Scale: Good       Standing balance-Leahy Scale: Good                      Cognition Arousal/Alertness: Awake/alert Behavior During Therapy: Impulsive;WFL for tasks  assessed/performed Overall Cognitive Status: Impaired/Different from baseline     Current Attention Level: Selective   Following Commands: Follows multi-step commands with increased time;Follows one step commands consistently   Awareness: Emergent        Exercises      General Comments        Pertinent Vitals/Pain Pain Assessment: No/denies pain    Home Living                      Prior Function            PT Goals (current goals can now be found in the care plan section) Acute Rehab PT Goals Patient Stated Goal: to go home Time For Goal Achievement: 09/27/15 Potential to Achieve Goals: Good Progress towards PT goals: Progressing toward goals    Frequency  Min 3X/week    PT Plan Discharge plan needs to be updated    Co-evaluation             End of Session   Activity Tolerance: Patient tolerated treatment well Patient left: in chair;with call bell/phone within reach;with chair alarm set     Time: [id number] PT Time Calculation (min) (ACUTE ONLY): 18 min  Charges:  $Gait Training: 8-22 mins                    G Codes:      Aquil Duhe, Eliseo GumKenneth V 09/17/2015, 3:57 PM 09/17/2015  [place] BingKen Zelia Yzaguirre, PT [phone]  (pager)

## 2015-09-17 NOTE — Care Management Note (Signed)
Case Management Note  Patient Details  Name: Austin RoughWilliam D Mendez MRN: [id number] Date of Birth: October 25, 1960  Subjective/Objective:                 Patient transferred from 3S 6/24. Patient with increased agitiation since transfer and low sodium. Hx ETOH and substance abuse, on CIWA. Lives at home with wife, declined SNF at DC as they would take his medicaid check. HH could assist with RN and HHA, however PT would not be covered. Patient's wife not in room to fully assess home situation. CM will continue to follow.    Action/Plan:  CM will continue to follow. Anticipate DC in a few days as Na and agitation normalize.  Expected Discharge Date:                  Expected Discharge Plan:  Home w Home Health Services  In-House Referral:  Clinical Social Work  Discharge planning Services  CM Consult  Post Acute Care Choice:  Home Health Choice offered to:  Patient  DME Arranged:    DME Agency:     HH Arranged:    HH Agency:     Status of Service:  In process, will continue to follow  If discussed at Long Length of Stay Meetings, dates discussed:    Additional Comments:  Lawerance SabalDebbie Pistol Kessenich, RN 09/17/2015, 2:55 PM

## 2015-09-17 NOTE — Progress Notes (Signed)
Austin Mendez  ZOX:[id number] DOB: [birth year] DOA: 09/07/2015 PCP: Billee Cashing, MD    Brief Narrative:  55yo M Hx poorly controlled HTN, ETOH abuse, Substance Abuse, Alcoholism, and Syncope who presented to the ED on 6/16 with 4 d h/o nausea and vomiting with dizziness.  He was hypokalemic, hypomagnesemic, and his BP was elevated requiring IV antihypertensive.   Following admission he became progressively restless and agitated. CIWA protocol was utilized.   Subjective: Reports frequent urination. Denies any other complaints.   Assessment & Plan:  Protracted Alcohol Withdrawal - Alcoholism Has waxed and waned significantly over the hospitalization with periods of lucidity mixed with periods of severe agitation - appears to be stable at this time,but confused about the dates and people. He wants to go home.   Hypomagnesemia Replete as needed and repeat in am.   Hypokalemia Required multiple doses to correct but now appears to be stable  Malignant Hypertension  A long standing issue complicated by noncompliance w/ medications - much improved.   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 Tobacco abuse  Nicotine patch  Possible Acute Kidney Injury Worsening creatinine of 1.4. Gentle hydration and repeat in am.   Hypoglycemia resolved   Hyponatremia: gentle hydration and repeat in am.   UTI: Started on IV rocephin and urine cultures added on.    DVT prophylaxis: SQ heparin Code Status: FULL CODE Family Communication: no family present at time of exam / called wife over the phone and discussed the plan of care.  Disposition Plan: home tomorrow. Pt refusing SNF.   Consultants:  PCCM  Procedures: TTE - 6/20 - see results above   Antimicrobials:  none   Objective: Blood pressure 134/77, pulse 67,  temperature 98.5 F (36.9 C), temperature source Oral, resp. rate 18, height  (1.676 m), weight 62.737 kg (138 lb 5 oz), SpO2 97 %.  Intake/Output Summary (Last 24 hours) at 09/17/15 1816 Last data filed at 09/17/15 1118  Gross per 24 hour  Intake   1715 ml  Output    450 ml  Net   1265 ml   Filed Weights   09/15/15 0312 09/16/15 0609 09/17/15 0700  Weight: 65 kg (143 lb 4.8 oz) 65.9 kg (145 lb 4.5 oz) 62.737 kg (138 lb 5 oz)    Examination: General: No acute respiratory distress -  Lungs: Clear to auscultation bilaterally - no wheeze  Cardiovascular: Regular rate and rhythm without murmur or gallup  Abdomen: Nontender, nondistended, soft, bowel sounds positive, no rebound, no ascites, no appreciable mass Extremities: No significant cyanosis, clubbing, edema bilateral lower extremities  CBC:  Recent Labs Lab 09/11/15 0434  WBC 7.9  HGB 12.2*  HCT 37.0*  MCV 95.4  PLT 179   Basic Metabolic Panel:  Recent Labs Lab 09/11/15 0434 09/13/15 0527 09/15/15 0418 09/16/15 0234 09/17/15 1303  NA 137 136 132* 129* 129*  K 3.7 3.8 3.7 3.8 4.1  CL 107 107 102 98* 98*  CO2 GLUCOSE 91 97 91 121* 115*  BUN 21* 21*  CREATININE 1.08 0.89 1.12 1.48* 1.34*  CALCIUM 9.0 9.4 9.2 9.1 9.0  MG 1.6* 1.3* 2.0 1.3* 1.9  PHOS 2.5  --   --   --   --    GFR: Estimated Creatinine Clearance:  55.2 mL/min (by C-G formula based on Cr of 1.34).  Liver Function Tests:  Recent Labs Lab 09/11/15 0434  AST 58*  ALT 45  ALKPHOS 67  BILITOT 0.9  PROT 6.8  ALBUMIN 2.6*    HbA1C: HGB A1C MFR BLD  Date/Time Value Ref Range Status  09/07/2015 02:56 PM 4.8 4.8 - 5.6 % Final    Comment:    (NOTE)         Pre-diabetes: 5.7 - 6.4         Diabetes: >6.4         Glycemic control for adults with diabetes: <7.0   10/17/2009 06:11 PM  <5.7 % Final   5.0 (NOTE)                                                                       According to the ADA Clinical  Practice Recommendations for 2011, when HbA1c is used as a screening test:   >=6.5%   Diagnostic of Diabetes Mellitus           (if abnormal result  is confirmed)  5.7-6.4%   Increased risk of developing Diabetes Mellitus  References:Diagnosis and Classification of Diabetes Mellitus,Diabetes Care,2011,34(Suppl 1):S62-S69 and Standards of Medical Care in         Diabetes - 2011,Diabetes Care,2011,34  (Suppl 1):S11-S61.    CBG:  Recent Labs Lab 09/11/15 0827 09/11/15 1128 09/11/15 1619 09/11/15 2112 09/12/15 0759  GLUCAP 76 108* 102* 120* 91    Recent Results (from the past 240 hour(s))  Culture, blood (routine x 2)     Status: None (Preliminary result)   Collection Time: 09/15/15 11:41 PM  Result Value Ref Range Status   Specimen Description BLOOD LEFT HAND  Final   Special Requests BOTTLES DRAWN AEROBIC AND ANAEROBIC 5CC   Final   Culture NO GROWTH 1 DAY  Final   Report Status PENDING  Incomplete  Culture, blood (routine x 2)     Status: None (Preliminary result)   Collection Time: 09/15/15 11:41 PM  Result Value Ref Range Status   Specimen Description BLOOD RIGHT ANTECUBITAL  Final   Special Requests BOTTLES DRAWN AEROBIC AND ANAEROBIC 5CC   Final   Culture NO GROWTH 1 DAY  Final   Report Status PENDING  Incomplete     Scheduled Meds: . antiseptic oral rinse  7 mL Mouth Rinse q12n4p  . [START ON 09/18/2015] cefTRIAXone (ROCEPHIN)  IV  1 g Intravenous Q24H  . chlorhexidine  15 mL Mouth Rinse BID  . cloNIDine  0.3 mg Oral TID  . feeding supplement (ENSURE ENLIVE)  237 mL Oral BID BM  . folic acid  1 mg Oral Daily  . heparin  5,000 Units Subcutaneous Q8H  . hydrALAZINE  100 mg Oral Q8H  . magnesium oxide  400 mg Oral BID  . metoprolol tartrate  100 mg Oral BID  . multivitamin with minerals  1 tablet Oral Daily  . nicotine  21 mg Transdermal Daily  . potassium chloride  40 mEq Oral Daily  . thiamine  100 mg Oral Daily   Continuous Infusions: . sodium chloride 75 mL  (09/17/15 1234)     LOS: 10 days   Time spent: 25 minutes  Kathlen ModyVijaya Moe Graca,  MD [phone] [facility] Office  [phone] Pager - Text Page per Amion as per below:  On-Call/Text Page:      [email or web address]      password [email or web address]  If 7PM-7AM, please contact night-coverage [email or web address] Password [email or web address] 09/17/2015, 6:16 PM

## 2015-09-17 NOTE — Progress Notes (Signed)
Nutrition Follow-up  DOCUMENTATION CODES:   Not applicable  INTERVENTION:   -Continue Ensure Enlive po BID, each supplement provides 350 kcal and 20 grams of protein  NUTRITION DIAGNOSIS:   Increased nutrient needs related to acute illness as evidenced by estimated needs.  Progressing  GOAL:   Patient will meet greater than or equal to 90% of their needs  Progressing  MONITOR:   PO intake, Supplement acceptance, Weight trends, Labs, I & O's  REASON FOR ASSESSMENT:   Consult Poor PO  ASSESSMENT:   55 y.o. male with medical history significant for poorly controlled HTN, ETOH abuse, presenting to to the ED with nausea and vomiting, diziness with onset about 4 days ago, accompanied by mild epigastric pain without diarrhea,.   Pt transferred from SDU to medical floor on 09/15/15.  Pt was awake and conversant at time of visit, sitting up in bed. Pt reports he has a great appetite and has been consuming approximately 100% of his meals over the past few days. He denies any difficulty chewing foods despite missing teeth. He shares with this RD that food sometimes "get stuck" in his throat, but is able to correct this by sipping water after swallowing.   Pt reports he really enjoys the Ensure supplements and would like to continue them. RD discussed importance of continued good meal and supplement intake to promote healing.   RNCM following; plan to d/c with home health once medically stable.   Labs reviewed: Na: 129 (on IV supplementation), Mg: 1.3.   Diet Order:  Diet Heart Room service appropriate?: Yes; Fluid consistency:: Thin  Skin:  Reviewed, no issues  Last BM:  09/16/15  Height:   Ht Readings from Last 1 Encounters:  09/13/15 5\' 6"  (1.676 m)    Weight:   Wt Readings from Last 1 Encounters:  09/17/15 138 lb 5 oz (62.737 kg)    Ideal Body Weight:  64.5 kg  BMI:  Body mass index is 22.33 kg/(m^2).  Estimated Nutritional Needs:   Kcal:   1800-2000  Protein:  80-90 grams  Fluid:  1.8 - 2 L/day  EDUCATION NEEDS:   No education needs identified at this time  Mohanad Carsten A. Mayford KnifeWilliams, RD, LDN, CDE Pager: [phone] After hours Pager: [phone]

## 2015-09-18 ENCOUNTER — Inpatient Hospital Stay (HOSPITAL_COMMUNITY): Payer: Medicaid Other

## 2015-09-18 LAB — CBC WITH DIFFERENTIAL/PLATELET
BASOS ABS: 0 10*3/uL (ref 0.0–0.1)
Basophils Relative: 0 %
EOS ABS: 0 10*3/uL (ref 0.0–0.7)
Eosinophils Relative: 0 %
HEMATOCRIT: 28.1 % — AB (ref 39.0–52.0)
Hemoglobin: 9.9 g/dL — ABNORMAL LOW (ref 13.0–17.0)
LYMPHS ABS: 1.2 10*3/uL (ref 0.7–4.0)
Lymphocytes Relative: 8 %
MCH: 32.8 pg (ref 26.0–34.0)
MCHC: 35.2 g/dL (ref 30.0–36.0)
MCV: 93 fL (ref 78.0–100.0)
MONO ABS: 0.9 10*3/uL (ref 0.1–1.0)
MONOS PCT: 6 %
NEUTROS ABS: 12.7 10*3/uL — AB (ref 1.7–7.7)
Neutrophils Relative %: 86 %
PLATELETS: 249 10*3/uL (ref 150–400)
RBC: 3.02 MIL/uL — AB (ref 4.22–5.81)
RDW: 15.3 % (ref 11.5–15.5)
WBC: 14.8 10*3/uL — AB (ref 4.0–10.5)

## 2015-09-18 LAB — BASIC METABOLIC PANEL
Anion gap: 8 (ref 5–15)
BUN: 18 mg/dL (ref 6–20)
CHLORIDE: 100 mmol/L — AB (ref 101–111)
CO2: 23 mmol/L (ref 22–32)
Calcium: 9.1 mg/dL (ref 8.9–10.3)
Creatinine, Ser: 1.28 mg/dL — ABNORMAL HIGH (ref 0.61–1.24)
GFR calc non Af Amer: >60 mL/min (ref >60)
Glucose, Bld: 149 mg/dL — ABNORMAL HIGH (ref 65–99)
Potassium: 3.8 mmol/L (ref 3.5–5.1)
SODIUM: 131 mmol/L — AB (ref 135–145)

## 2015-09-18 LAB — LACTIC ACID, PLASMA: LACTIC ACID, VENOUS: 0.7 mmol/L (ref 0.5–1.9)

## 2015-09-18 MED ORDER — LEVALBUTEROL HCL 0.63 MG/3ML IN NEBU
INHALATION_SOLUTION | RESPIRATORY_TRACT | Status: AC
  Administered 2015-09-18: 02:00:00
  Filled 2015-09-18: qty 3

## 2015-09-18 MED ORDER — LEVALBUTEROL HCL 0.63 MG/3ML IN NEBU: 0.6300 mg | INHALATION_SOLUTION | Freq: Once | RESPIRATORY_TRACT | Status: DC

## 2015-09-18 MED ORDER — FUROSEMIDE 10 MG/ML IJ SOLN
40.0000 mg | Freq: Once | INTRAMUSCULAR | Status: AC
  Administered 2015-09-18: 40 mg via INTRAVENOUS
  Filled 2015-09-18: qty 4

## 2015-09-18 MED ORDER — LEVALBUTEROL HCL 0.63 MG/3ML IN NEBU
0.6300 mg | INHALATION_SOLUTION | Freq: Three times a day (TID) | RESPIRATORY_TRACT | Status: DC
  Administered 2015-09-18 – 2015-09-19 (×4): 0.63 mg via RESPIRATORY_TRACT
  Filled 2015-09-18 (×4): qty 3

## 2015-09-18 MED ORDER — IPRATROPIUM-ALBUTEROL 0.5-2.5 (3) MG/3ML IN SOLN: 3.0000 mL | Freq: Four times a day (QID) | RESPIRATORY_TRACT | Status: DC | PRN

## 2015-09-18 MED ORDER — PIPERACILLIN-TAZOBACTAM 3.375 G IVPB
3.3750 g | Freq: Three times a day (TID) | INTRAVENOUS | Status: DC
  Administered 2015-09-18 – 2015-09-21 (×10): 3.375 g via INTRAVENOUS
  Filled 2015-09-18 (×13): qty 50

## 2015-09-18 NOTE — Progress Notes (Signed)
Austin Mendez  ZOX:[id number] DOB: [birth year] DOA: 09/07/2015 PCP: Billee CashingMCKENZIE, WAYLAND, MD    Brief Narrative:  55yo M Hx poorly controlled HTN, ETOH abuse, Substance Abuse, Alcoholism, and Syncope who presented to the ED on 6/16 with 4 d h/o nausea and vomiting with dizziness.  He was hypokalemic, hypomagnesemic, and his BP was elevated requiring IV antihypertensive.   Following admission he became progressively restless and agitated. CIWA protocol was utilized.  Overnight patient had difficulty breathing requring oxygen, was wheezing on exam, CXR revealed atypical infection vs interstitial edema, rocephin changed to IV Zosyn ,.   Subjective: Reports trouble breathing, was trying to get out of bed, slightly cnfused.   Assessment & Plan:  Protracted Alcohol Withdrawal - Alcoholism Has waxed and waned significantly over the hospitalization with periods of lucidity mixed with periods of severe agitation - appears to be stable at this time,but confused about the dates and people. He wants to go home.   Hypomagnesemia Replete as needed and repeat in am.   Hypokalemia Required multiple doses to correct but now appears to be stable  Malignant Hypertension  A long standing issue complicated by noncompliance w/ medications - add on hydralazine prn.   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 No syncopal episodes so far.   Tobacco abuse  Nicotine patch  Possible Acute Kidney Injury Worsening creatinine of 1.4. Gentle hydration and repeat in am shows improvement.   Hypoglycemia resolved   Hyponatremia: gentle hydration and repeat in am.   UTI: Started on IV rocephin and urine cultures  Grew E coli, sensitivities pending, IV rocephin changed to IV zosyn.   Hypoxia requiring oxygen, CXR shows atypical infection vs  interstitial edema.  One dose of lasix given.  IV ZOSYN, for possible aspiration. SLP eval ordered.  duonebs as needed.  Monitor.    DVT prophylaxis: SQ heparin Code Status: FULL CODE Family Communication: no family present at time of exam / called wife over the phone and discussed the plan of care.  Disposition Plan: home 1 to 2 days. Pt refusing SNF.   Consultants:  PCCM  Procedures: TTE - 6/20 - see results above   Antimicrobials:  none   Objective: Blood pressure 160/94, pulse 71, temperature 99.1 F (37.3 C), temperature source Oral, resp. rate 18, height 5\' 6"  (1.676 m), weight 64.819 kg (142 lb 14.4 oz), SpO2 97 %.  Intake/Output Summary (Last 24 hours) at 09/18/15 1932 Last data filed at 09/18/15 1912  Gross per 24 hour  Intake 1712.17 ml  Output   3625 ml  Net -1912.83 ml   Filed Weights   09/16/15 0609 09/17/15 0700 09/18/15 0411  Weight: 65.9 kg (145 lb 4.5 oz) 62.737 kg (138 lb 5 oz) 64.819 kg (142 lb 14.4 oz)    Examination: General: No acute respiratory distress -  Lungs: scattered wheezing posterior.  Cardiovascular: Regular rate and rhythm without murmur or gallup  Abdomen: Nontender, nondistended, soft, bowel sounds positive, no rebound, no ascites, no appreciable mass Extremities: No significant cyanosis, clubbing, edema bilateral lower extremities  CBC:  Recent Labs Lab 09/18/15 0336  WBC 14.8*  NEUTROABS 12.7*  HGB 9.9*  HCT 28.1*  MCV 93.0  PLT 249   Basic Metabolic Panel:  Recent Labs Lab 09/13/15 0527 09/15/15 0418 09/16/15 0234 09/17/15 1303 09/18/15 1228  NA 136  132* 129* 129* 131*  K 3.8 3.7 3.8 4.1 3.8  CL 107 102 98* 98* 100*  CO2 22 22 22 24 23   GLUCOSE 97 91 121* 115* 149*  BUN 8 14 21* 21* 18  CREATININE 0.89 1.12 1.48* 1.34* 1.28*  CALCIUM 9.4 9.2 9.1 9.0 9.1  MG 1.3* 2.0 1.3* 1.9  --    GFR: Estimated Creatinine Clearance: 58.8 mL/min (by C-G formula based on Cr of 1.28).  Liver Function Tests: No results  for input(s): AST, ALT, ALKPHOS, BILITOT, PROT, ALBUMIN in the last 168 hours.  HbA1C: HGB A1C MFR BLD  Date/Time Value Ref Range Status  09/07/2015 02:56 PM 4.8 4.8 - 5.6 % Final    Comment:    (NOTE)         Pre-diabetes: 5.7 - 6.4         Diabetes: >6.4         Glycemic control for adults with diabetes: <7.0   10/17/2009 06:11 PM  <5.7 % Final   5.0 (NOTE)                                                                       According to the ADA Clinical Practice Recommendations for 2011, when HbA1c is used as a screening test:   >=6.5%   Diagnostic of Diabetes Mellitus           (if abnormal result  is confirmed)  5.7-6.4%   Increased risk of developing Diabetes Mellitus  References:Diagnosis and Classification of Diabetes Mellitus,Diabetes Care,2011,34(Suppl 1):S62-S69 and Standards of Medical Care in         Diabetes - 2011,Diabetes Care,2011,34  (Suppl 1):S11-S61.    CBG:  Recent Labs Lab 09/11/15 2112 09/12/15 0759  GLUCAP 120* 91    Recent Results (from the past 240 hour(s))  Culture, blood (routine x 2)     Status: None (Preliminary result)   Collection Time: 09/15/15 11:41 PM  Result Value Ref Range Status   Specimen Description BLOOD LEFT HAND  Final   Special Requests BOTTLES DRAWN AEROBIC AND ANAEROBIC 5CC   Final   Culture NO GROWTH 2 DAYS  Final   Report Status PENDING  Incomplete  Culture, blood (routine x 2)     Status: None (Preliminary result)   Collection Time: 09/15/15 11:41 PM  Result Value Ref Range Status   Specimen Description BLOOD RIGHT ANTECUBITAL  Final   Special Requests BOTTLES DRAWN AEROBIC AND ANAEROBIC 5CC   Final   Culture NO GROWTH 2 DAYS  Final   Report Status PENDING  Incomplete  Culture, Urine     Status: Abnormal (Preliminary result)   Collection Time: 09/17/15  3:47 PM  Result Value Ref Range Status   Specimen Description URINE, CLEAN CATCH  Final   Special Requests NONE  Final   Culture >=100,000 COLONIES/mL ESCHERICHIA COLI  (A)  Final   Report Status PENDING  Incomplete     Scheduled Meds: . antiseptic oral rinse  7 mL Mouth Rinse q12n4p  . chlorhexidine  15 mL Mouth Rinse BID  . cloNIDine  0.3 mg Oral TID  . feeding supplement (ENSURE ENLIVE)  237 mL Oral BID BM  . folic acid  1 mg Oral Daily  .  heparin  5,000 Units Subcutaneous Q8H  . hydrALAZINE  100 mg Oral Q8H  . levalbuterol  0.63 mg Nebulization Once  . levalbuterol  0.63 mg Nebulization TID  . magnesium oxide  400 mg Oral BID  . metoprolol tartrate  100 mg Oral BID  . multivitamin with minerals  1 tablet Oral Daily  . nicotine  21 mg Transdermal Daily  . piperacillin-tazobactam (ZOSYN)  IV  3.375 g Intravenous Q8H  . potassium chloride  40 mEq Oral Daily  . thiamine  100 mg Oral Daily   Continuous Infusions:     LOS: 11 days   Time spent: 25 minutes   Kathlen Mody,  MD [phone] [facility] Office  [phone] Pager - Text Page per Amion as per below:  On-Call/Text Page:      [email or web address]      password [email or web address]  If 7PM-7AM, please contact night-coverage [email or web address] Password [facility] 09/18/2015, 7:32 PM

## 2015-09-18 NOTE — Progress Notes (Signed)
Around 0130 pt was complaining of being short of breathe. Pt was placed on 2L of O2 with no relief. Pt was then increased to 4L and the NP was paged for a breathing treatment order. K.Schorr order xopenox and a chest xray. Rapid response and respiratory came in and pt was placed on a venti mask with 8L of O2 and fluids were decreased to 20 ml/hr due to speculations of fluid overload. Pt states he is feeling a little better. Will continue to monitor

## 2015-09-18 NOTE — Progress Notes (Signed)
Respiratory Therapist called for neb treatment per Blake DivineAkula MD. Therapist Brett CanalesSteve weaned pt off of oxygen and patient O2 stats in the upper 90's. Therapist said lung sounds clear and there was no need to neb at this time. Will continue to monitor.

## 2015-09-18 NOTE — Progress Notes (Signed)
Pharmacy Antibiotic Note  Willa RoughWilliam D Degregory is a 55 y.o. male admitted on 09/07/2015 with aspiration  pneumonia.  Pharmacy has been consulted for zosyn dosing.  He was started on Rocephin earlier tonight for UTI.   Plan: Zosyn 3.375g IV q8h (4 hour infusion).  Consider stopping Rocephin  Height: 5\' 6"  (167.6 cm) Weight: 138 lb 5 oz (62.737 kg) IBW/kg (Calculated) : 63.8  Temp (24hrs), Avg:99.4 F (37.4 C), Min:98.5 F (36.9 C), Max:100.2 F (37.9 C)   Recent Labs Lab 09/11/15 0434 09/13/15 0527 09/15/15 0418 09/15/15 2341 09/16/15 0230 09/16/15 0234 09/17/15 1303  WBC 7.9  --   --   --   --   --   --   CREATININE 1.08 0.89 1.12  --   --  1.48* 1.34*  LATICACIDVEN  --   --   --  1.5 0.9  --   --     Estimated Creatinine Clearance: 55.2 mL/min (by C-G formula based on Cr of 1.34).    No Known Allergies  Thank you for allowing pharmacy to be a part of this patient's care.  Herby AbrahamMichelle T. Ayjah Show, Pharm.D. [phone] 09/18/2015 3:04 AM

## 2015-09-19 ENCOUNTER — Inpatient Hospital Stay (HOSPITAL_COMMUNITY): Payer: Medicaid Other

## 2015-09-19 DIAGNOSIS — N179 Acute kidney failure, unspecified: Secondary | ICD-10-CM

## 2015-09-19 DIAGNOSIS — J690 Pneumonitis due to inhalation of food and vomit: Secondary | ICD-10-CM

## 2015-09-19 DIAGNOSIS — F1010 Alcohol abuse, uncomplicated: Secondary | ICD-10-CM

## 2015-09-19 LAB — CBC
HCT: 30.7 % — ABNORMAL LOW (ref 39.0–52.0)
HEMOGLOBIN: 10.5 g/dL — AB (ref 13.0–17.0)
MCH: 31.1 pg (ref 26.0–34.0)
MCHC: 34.2 g/dL (ref 30.0–36.0)
MCV: 90.8 fL (ref 78.0–100.0)
PLATELETS: 281 10*3/uL (ref 150–400)
RBC: 3.38 MIL/uL — AB (ref 4.22–5.81)
RDW: 15.3 % (ref 11.5–15.5)
WBC: 9.1 10*3/uL (ref 4.0–10.5)

## 2015-09-19 LAB — TSH: TSH: 2.723 u[IU]/mL (ref 0.350–4.500)

## 2015-09-19 LAB — BASIC METABOLIC PANEL
ANION GAP: 9 (ref 5–15)
BUN: 15 mg/dL (ref 6–20)
CHLORIDE: 97 mmol/L — AB (ref 101–111)
CO2: 24 mmol/L (ref 22–32)
Calcium: 9.1 mg/dL (ref 8.9–10.3)
Creatinine, Ser: 1.27 mg/dL — ABNORMAL HIGH (ref 0.61–1.24)
GFR calc Af Amer: >60 mL/min (ref >60)
Glucose, Bld: 101 mg/dL — ABNORMAL HIGH (ref 65–99)
POTASSIUM: 3.7 mmol/L (ref 3.5–5.1)
SODIUM: 130 mmol/L — AB (ref 135–145)

## 2015-09-19 LAB — URINE CULTURE

## 2015-09-19 LAB — CREATININE, URINE, RANDOM: Creatinine, Urine: 86.82 mg/dL

## 2015-09-19 LAB — MAGNESIUM: MAGNESIUM: 1.5 mg/dL — AB (ref 1.7–2.4)

## 2015-09-19 LAB — OSMOLALITY, URINE: Osmolality, Ur: 283 mOsm/kg — ABNORMAL LOW (ref 300–900)

## 2015-09-19 LAB — CORTISOL: Cortisol, Plasma: 11.5 ug/dL

## 2015-09-19 LAB — OSMOLALITY: Osmolality: 282 mOsm/kg (ref 275–295)

## 2015-09-19 LAB — SODIUM, URINE, RANDOM: Sodium, Ur: <10 mmol/L

## 2015-09-19 LAB — PROTEIN, URINE, RANDOM: Total Protein, Urine: 14 mg/dL

## 2015-09-19 MED ORDER — LEVALBUTEROL HCL 0.63 MG/3ML IN NEBU
0.6300 mg | INHALATION_SOLUTION | Freq: Two times a day (BID) | RESPIRATORY_TRACT | Status: DC
  Administered 2015-09-19 – 2015-09-21 (×4): 0.63 mg via RESPIRATORY_TRACT
  Filled 2015-09-19 (×4): qty 3

## 2015-09-19 MED ORDER — EPINEPHRINE HCL 0.1 MG/ML IJ SOSY
PREFILLED_SYRINGE | INTRAMUSCULAR | Status: AC
  Filled 2015-09-19: qty 20

## 2015-09-19 MED ORDER — IPRATROPIUM-ALBUTEROL 0.5-2.5 (3) MG/3ML IN SOLN: 3.0000 mL | RESPIRATORY_TRACT | Status: DC | PRN

## 2015-09-19 MED ORDER — MAGNESIUM SULFATE 2 GM/50ML IV SOLN
2.0000 g | Freq: Once | INTRAVENOUS | Status: AC
  Administered 2015-09-19: 2 g via INTRAVENOUS
  Filled 2015-09-19: qty 50

## 2015-09-19 MED ORDER — CARVEDILOL 25 MG PO TABS
25.0000 mg | ORAL_TABLET | Freq: Two times a day (BID) | ORAL | Status: DC
Start: 2015-09-19 — End: 2015-09-21
  Administered 2015-09-19 – 2015-09-21 (×4): 25 mg via ORAL
  Filled 2015-09-19 (×4): qty 1

## 2015-09-19 NOTE — Progress Notes (Signed)
MBSS complete. Full report located under chart review in imaging section.  Recommendations: Regular diet, thin liquids, meds whole in puree or crushed if large, full supervision: cue pt to use chin tuck strategy and swallow multiple times to clear residuals, small bites/ sips, sit fully upright during and 60 minutes after meal.  Thana AtesAmy Oleksiak, MA, CCC-SLP [phone]

## 2015-09-19 NOTE — Progress Notes (Signed)
OT Cancellation Note  Patient Details Name: Austin Mendez MRN: [id number] DOB: [birth year]   Cancelled Treatment:    Reason Eval/Treat Not Completed: Pain limiting ability to participate (abdominal pain 10/10, RN notified)  Nils PyleJulia Christpoher Sievers, OTR/L Pager: [phone] 09/19/2015, 5:13 PM

## 2015-09-19 NOTE — Progress Notes (Signed)
Speech Language Pathology Treatment: Dysphagia  Patient Details Name: Austin Mendez MRN: [id number] DOB: 1960/04/02 Today's Date: 09/19/2015 Time: [id number] SLP Time Calculation (min) (ACUTE ONLY): 15 min  Assessment / Plan / Recommendation Clinical Impression  Pt observed with am meal; no signs of aspiration over the course of the meal and no signs of esophageal dysphagia.  Pt is observed to have slight weakness of the left upper lip and also required verbal cues x1 to clear packed solids in left buccal cavity. His CT upon admission was negative for CVA, but he has not had an MRI. Given concern for impaired resonance and voice (though this is likely baseline), as well and possibility of finding of aspiration on CXR, will f/u with MBS today to evaluate pt for potential silent aspiration.     HPI HPI: 55yo M Hx poorly controlled HTN, ETOH abuse, Substance Abuse, Alcoholism, and Syncope who presented to the ED on 6/16 with 4 d h/o nausea and vomiting with dizziness. He was hypokalemic, hypomagnesemic, and his BP was elevated requiring IV antihypertensive. Mentation has waxed and waned significantly over the hospitalization with periods of lucidity mixed with periods of severe agitation. On 6/27 in early am pt became short of breath. CXR showed interstitial opacities, possible inflammatory pneumonitis.       SLP Plan  MBS     Recommendations  Diet recommendations: Regular;Thin liquid             Oral Care Recommendations: Oral care BID Follow up Recommendations: None Plan: MBS     GO               Austin DittyBonnie Lurie Mullane, MA CCC-SLP [phone]  Austin Mendez, Austin Mendez 09/19/2015, 9:51 AM

## 2015-09-19 NOTE — Progress Notes (Signed)
Physical Therapy Treatment Patient Details Name: Austin Mendez MRN: [id number] DOB: 1960-09-28 Today's Date: 09/19/2015    History of Present Illness 55 y.o. male with medical history significant for poorly controlled HTN, ETOH abuse, presenting to to the ED with nausea and vomiting, diziness with onset about 4 days ago, accompanied by mild epigastric pain without diarrhea,.     PT Comments    Pt has met all goals, is likely at baseline functioning and is safe in a home like environment.  He will have family present as needed.  Will sign off at this time as he has no PT needs.  Follow Up Recommendations  No PT follow up     Equipment Recommendations  None recommended by PT    Recommendations for Other Services       Precautions / Restrictions Precautions Precautions: Fall    Mobility  Bed Mobility Overal bed mobility: Modified Independent                Transfers Overall transfer level: Modified independent                  Ambulation/Gait Ambulation/Gait assistance: Supervision Ambulation Distance (Feet): 700 Feet Assistive device: None Gait Pattern/deviations: Step-through pattern Gait velocity: able to speed up to functional level Gait velocity interpretation: >2.62 ft/sec, indicative of independent community ambulator General Gait Details: steady, age appropriate gait speed and accepts balance challenge well   Stairs            Wheelchair Mobility    Modified Rankin (Stroke Patients Only)       Balance Overall balance assessment: Needs assistance   Sitting balance-Leahy Scale: Normal     Standing balance support: No upper extremity supported Standing balance-Leahy Scale: Good                      Cognition Arousal/Alertness: Awake/alert Behavior During Therapy: WFL for tasks assessed/performed Overall Cognitive Status: Impaired/Different from baseline Area of Impairment: Orientation   Current Attention Level:  Selective   Following Commands: Follows multi-step commands with increased time;Follows one step commands consistently Safety/Judgement: Decreased awareness of safety;Decreased awareness of deficits Awareness: Emergent        Exercises      General Comments        Pertinent Vitals/Pain Pain Assessment: Faces Faces Pain Scale: Hurts a little bit Pain Location: calves Pain Descriptors / Indicators: Tightness Pain Intervention(s): Monitored during session    Home Living                      Prior Function            PT Goals (current goals can now be found in the care plan section) Acute Rehab PT Goals PT Goal Formulation: With patient Time For Goal Achievement: 09/27/15 Potential to Achieve Goals: Good Progress towards PT goals: Progressing toward goals;Goals met/education completed, patient discharged from PT    Frequency       PT Plan Current plan remains appropriate    Co-evaluation             End of Session   Activity Tolerance: Patient tolerated treatment well Patient left: in bed;with call bell/phone within reach     Time: 1456-1515 PT Time Calculation (min) (ACUTE ONLY): 19 min  Charges:  $Gait Training: 8-22 mins                    G Codes:  Drae Mitzel, Tessie Fass 09/19/2015, 3:26 PM  09/19/2015  Donnella Sham, PT [phone]  (pager)

## 2015-09-19 NOTE — Progress Notes (Signed)
Austin RoughWilliam D Mendez  ZOX:[id number] DOB: 1960/05/27 DOA: 09/07/2015 PCP: Billee CashingMCKENZIE, WAYLAND, MD    Brief Narrative:  55yo M Hx poorly controlled HTN, ETOH abuse, Substance Abuse, Alcoholism, and Syncope who presented to the ED on 6/16 with 4 d h/o nausea and vomiting with dizziness.  He was hypokalemic, hypomagnesemic, and his BP was elevated requiring IV antihypertensive.   Following admission he became progressively restless and agitated. CIWA protocol was utilized.  Overnight patient had difficulty breathing requring oxygen, was wheezing on exam, CXR revealed atypical infection vs interstitial edema, rocephin changed to IV Zosyn ,.   Subjective: Reports trouble breathing, was trying to get out of bed, slightly cnfused.   Assessment & Plan:  - Alcohol abuse - No withdrawal symptoms should be present at this time (patient has been in hospital for close to 12 days). Monitor and continue to assess for possible neuro effect of chronic alcoholism.  - Hypomagnesemia - Replete. Magnesium is 1.5 today - Hypokalemia - Resolved significantly. Potassium is 3.7 today. - Accelerated Hypertension  - Change Metoprolol to Coreg. Room to add ACEI or ARB if BP is not optimized. - Dizziness with presyncopal episode - PT/OT input appreciated. - Tobacco abuse  - Counseled to quit tobacco. Nicotine patch - Possible Acute Kidney Injury - Check Urine sodium, protein and creatinine. May be secondary to hemodynamic changes. Renal ultrasound, C3,C4 and ANA. - Aspiration pneumonia versus atelectasis - Fever of 100.6F documented. Leukocytosis has resolved. CT chest. UA and urine culture. On IV Zosyn.  - Hyponatremia: Work up. Possible [place], pre renal versus SIADH. - UTI: Started on IV rocephin and urine cultures  Grew E coli, sensitivities pending, IV rocephin changed to IV zosyn.   Hypoxia requiring oxygen - Improving. CXR is improving. Cannot rule out pulmonary edema v  DVT prophylaxis: SQ heparin Code  Status: FULL CODE Family Communication: no family present at time of exam / called wife over the phone and discussed the plan of care.  Disposition Plan: home 1 to 2 days. Pt refusing SNF.   Consultants:  PCCM  Procedures: TTE - 6/20 - see results above   Antimicrobials:  none   Objective: Blood pressure 171/88, pulse 68, temperature 100.5 F (38.1 C), temperature source Oral, resp. rate 18, height 5\' 6"  (1.676 m), weight 63.549 kg (140 lb 1.6 oz), SpO2 90 %.  Intake/Output Summary (Last 24 hours) at 09/19/15 1058 Last data filed at 09/19/15 1029  Gross per 24 hour  Intake 1144.67 ml  Output   3600 ml  Net -2455.33 ml   Filed Weights   09/17/15 0700 09/18/15 0411 09/19/15 0508  Weight: 62.737 kg (138 lb 5 oz) 64.819 kg (142 lb 14.4 oz) 63.549 kg (140 lb 1.6 oz)    Examination: General: Not in distress. Comfortable. Lungs: Mild expiratory wheeze. Decreased air entry. Cardiovascular: S1S2.  Abdomen: Nontender, nondistended, soft, bowel sounds positive, no rebound, no ascites, no appreciable mass Extremities: No leg edema. Neuro - Very mild facial asymmetry. Moves all limbs. Awake and alert. Oriented to time and place.  CBC:  Recent Labs Lab 09/18/15 0336 09/19/15 0537  WBC 14.8* 9.1  NEUTROABS 12.7*  --   HGB 9.9* 10.5*  HCT 28.1* 30.7*  MCV 93.0 90.8  PLT 249 281   Basic Metabolic Panel:  Recent Labs Lab 09/13/15 0527 09/15/15 0418 09/16/15 0234 09/17/15 1303 09/18/15 1228 09/19/15 0537  NA 136 132* 129* 129* 131* 130*  K 3.8 3.7 3.8 4.1 3.8 3.7  CL 107  102 98* 98* 100* 97*  CO2 GLUCOSE 97 91 121* 115* 149* 101*  BUN 8 14 21* 21* 18 15  CREATININE 0.89 1.12 1.48* 1.34* 1.28* 1.27*  CALCIUM 9.4 9.2 9.1 9.0 9.1 9.1  MG 1.3* 2.0 1.3* 1.9  --  1.5*   GFR: Estimated Creatinine Clearance: 59 mL/min (by C-G formula based on Cr of 1.27).  Liver Function Tests: No results for input(s): AST, ALT, ALKPHOS, BILITOT, PROT, ALBUMIN in the  last 168 hours.  HbA1C: HGB A1C MFR BLD  Date/Time Value Ref Range Status  09/07/2015 02:56 PM 4.8 4.8 - 5.6 % Final    Comment:    (NOTE)         Pre-diabetes: 5.7 - 6.4         Diabetes: >6.4         Glycemic control for adults with diabetes: <7.0   10/17/2009 06:11 PM  <5.7 % Final   5.0 (NOTE)                                                                       According to the ADA Clinical Practice Recommendations for 2011, when HbA1c is used as a screening test:   >=6.5%   Diagnostic of Diabetes Mellitus           (if abnormal result  is confirmed)  5.7-6.4%   Increased risk of developing Diabetes Mellitus  References:Diagnosis and Classification of Diabetes Mellitus,Diabetes Care,2011,34(Suppl 1):S62-S69 and Standards of Medical Care in         Diabetes - 2011,Diabetes Care,2011,34  (Suppl 1):S11-S61.    CBG: No results for input(s): GLUCAP in the last 168 hours.  Recent Results (from the past 240 hour(s))  Culture, blood (routine x 2)     Status: None (Preliminary result)   Collection Time: 09/15/15 11:41 PM  Result Value Ref Range Status   Specimen Description BLOOD LEFT HAND  Final   Special Requests BOTTLES DRAWN AEROBIC AND ANAEROBIC 5CC   Final   Culture NO GROWTH 2 DAYS  Final   Report Status PENDING  Incomplete  Culture, blood (routine x 2)     Status: None (Preliminary result)   Collection Time: 09/15/15 11:41 PM  Result Value Ref Range Status   Specimen Description BLOOD RIGHT ANTECUBITAL  Final   Special Requests BOTTLES DRAWN AEROBIC AND ANAEROBIC 5CC   Final   Culture NO GROWTH 2 DAYS  Final   Report Status PENDING  Incomplete  Culture, Urine     Status: Abnormal   Collection Time: 09/17/15  3:47 PM  Result Value Ref Range Status   Specimen Description URINE, CLEAN CATCH  Final   Special Requests NONE  Final   Culture >=100,000 COLONIES/mL ESCHERICHIA COLI (A)  Final   Report Status 09/19/2015 FINAL  Final   Organism ID, Bacteria ESCHERICHIA COLI (A)   Final      Susceptibility   Escherichia coli - MIC*    AMPICILLIN <=2 SENSITIVE Sensitive     CEFAZOLIN <=4 SENSITIVE Sensitive     CEFTRIAXONE <=1 SENSITIVE Sensitive     CIPROFLOXACIN <=0.25 SENSITIVE Sensitive     GENTAMICIN <=1 SENSITIVE Sensitive     IMIPENEM <=0.25  SENSITIVE Sensitive     NITROFURANTOIN <=16 SENSITIVE Sensitive     TRIMETH/SULFA <=20 SENSITIVE Sensitive     AMPICILLIN/SULBACTAM <=2 SENSITIVE Sensitive     PIP/TAZO <=4 SENSITIVE Sensitive     * >=100,000 COLONIES/mL ESCHERICHIA COLI     Scheduled Meds: . antiseptic oral rinse  7 mL Mouth Rinse q12n4p  . carvedilol  25 mg Oral BID WC  . chlorhexidine  15 mL Mouth Rinse BID  . cloNIDine  0.3 mg Oral TID  . EPINEPHrine      . feeding supplement (ENSURE ENLIVE)  237 mL Oral BID BM  . folic acid  1 mg Oral Daily  . heparin  5,000 Units Subcutaneous Q8H  . hydrALAZINE  100 mg Oral Q8H  . levalbuterol  0.63 mg Nebulization Once  . levalbuterol  0.63 mg Nebulization BID  . magnesium oxide  400 mg Oral BID  . multivitamin with minerals  1 tablet Oral Daily  . nicotine  21 mg Transdermal Daily  . piperacillin-tazobactam (ZOSYN)  IV  3.375 g Intravenous Q8H  . potassium chloride  40 mEq Oral Daily  . thiamine  100 mg Oral Daily   Continuous Infusions:     LOS: 12 days   Time spent: 25 minutes   Kathlen ModyVijaya Akula,  MD [phone] [facility] Office  [phone] Pager - Text Page per Amion as per below:  On-Call/Text Page:      [email or web address]      password [email or web address]  If 7PM-7AM, please contact night-coverage [email or web address] Password [email or web address] 09/19/2015, 10:58 AM

## 2015-09-19 NOTE — Progress Notes (Signed)
   09/18/15 1100  SLP Visit Information  SLP Received On 09/18/15  General Information  HPI 55yo M Hx poorly controlled HTN, ETOH abuse, Substance Abuse, Alcoholism, and Syncope who presented to the ED on 6/16 with 4 d h/o nausea and vomiting with dizziness. He was hypokalemic, hypomagnesemic, and his BP was elevated requiring IV antihypertensive. Mentation has waxed and waned significantly over the hospitalization with periods of lucidity mixed with periods of severe agitation. On 6/27 in early am pt became short of breath. CXR showed interstitial opacities, possible inflammatory pneumonitis.   Type of Study Bedside Swallow Evaluation  Previous Swallow Assessment none in chart  Diet Prior to this Study Regular;Thin liquids  Temperature Spikes Noted Yes  Respiratory Status Nasal cannula  History of Recent Intubation No  Behavior/Cognition Alert;Cooperative;Pleasant mood  Oral Cavity Assessment WFL  Oral Care Completed by SLP No  Oral Cavity - Dentition Poor condition;Missing dentition  Vision Functional for self-feeding  Self-Feeding Abilities Able to feed self  Patient Positioning Upright in bed  Baseline Vocal Quality Suspected CN X (Vagus) involvement  Volitional Cough Strong  Volitional Swallow Able to elicit  Pain Assessment  Pain Assessment No/denies pain  Oral Motor/Sensory Function  Overall Oral Motor/Sensory Function Moderate impairment  Facial ROM WFL  Facial Symmetry WFL  Facial Strength WFL  Facial Sensation WFL  Lingual ROM WFL  Lingual Symmetry WFL  Lingual Strength WFL  Lingual Sensation WFL  Velum Suspected CN X (Vagus) dysfunction  Mandible WFL  Thin Liquid  Thin Liquid Impaired  Presentation Cup;Straw  Other Comments rapid, impulsive intake  Nectar Thick Liquid  Nectar Thick Liquid NT  Honey Thick Liquid  Honey Thick Liquid NT  Puree  Puree WFL  Solid  Solid WFL  Suspected Esophageal Findings  Suspected Esophageal Findings Belching  SLP Assessment:  late entry  Clinical Impression Statement (ACUTE ONLY) Pt demonstrates no signs of aspiration, but several other findings are concerning on exam. Pts voice is extremely harsh, his resonance is hypernasal and dysarthric. Pt reports this as new, but RN reports this is his baseline. Pt denies any difficulty swallowing and subjectively does appear to protect his airway, though intake is rapid and often followed by belching and increased RR. There are several concerns such at CN X impairment, laryngeal or pharyngeal pathology or primary esophageal dysphagia. Plan to f/u again for possible further testing depending on pts function with a meal. For now, pt may continue current diet.   Impact on safety and function Mild aspiration risk  Other Related Risk Factors Decreased respiratory status  Swallow Evaluation Recommendations  SLP Diet Recommendations Regular;Thin liquid  Liquid Administration via Cup;Straw  Medication Administration Whole meds with liquid  Supervision Patient able to self feed  Compensations Slow rate  Postural Changes Seated upright at 90 degrees  Treatment Plan  Oral Care Recommendations Oral care BID  Treatment Recommendations Therapy as outlined in treatment plan below  Follow up Recommendations None  Speech Therapy Frequency (ACUTE ONLY) min 2x/week  Treatment Duration 1 week  Individuals Consulted  Consulted and Agree with Results and Recommendations Patient;RN  SLP Time Calculation  SLP Start Time (ACUTE ONLY) 1215  SLP Stop Time (ACUTE ONLY) 1230  SLP Time Calculation (min) (ACUTE ONLY) 15 min  SLP Evaluations  $ SLP Speech Visit 1 Procedure  SLP Evaluations  $BSS Swallow 1 Procedure

## 2015-09-20 DIAGNOSIS — N390 Urinary tract infection, site not specified: Secondary | ICD-10-CM

## 2015-09-20 DIAGNOSIS — Z720 Tobacco use: Secondary | ICD-10-CM

## 2015-09-20 LAB — RENAL FUNCTION PANEL
Albumin: 2.2 g/dL — ABNORMAL LOW (ref 3.5–5.0)
Anion gap: 10 (ref 5–15)
BUN: 14 mg/dL (ref 6–20)
CO2: 22 mmol/L (ref 22–32)
Calcium: 9 mg/dL (ref 8.9–10.3)
Chloride: 99 mmol/L — ABNORMAL LOW (ref 101–111)
Creatinine, Ser: 1.08 mg/dL (ref 0.61–1.24)
GFR calc Af Amer: >60 mL/min (ref >60)
GFR calc non Af Amer: >60 mL/min (ref >60)
Glucose, Bld: 111 mg/dL — ABNORMAL HIGH (ref 65–99)
Phosphorus: 4.1 mg/dL (ref 2.5–4.6)
Potassium: 3.6 mmol/L (ref 3.5–5.1)
Sodium: 131 mmol/L — ABNORMAL LOW (ref 135–145)

## 2015-09-20 LAB — C3 COMPLEMENT: C3 Complement: 169 mg/dL — ABNORMAL HIGH (ref 82–167)

## 2015-09-20 LAB — C4 COMPLEMENT: Complement C4, Body Fluid: 29 mg/dL (ref 14–44)

## 2015-09-20 LAB — PHOSPHORUS: Phosphorus: 4.1 mg/dL (ref 2.5–4.6)

## 2015-09-20 LAB — ANTINUCLEAR ANTIBODIES, IFA: ANA Ab, IFA: NEGATIVE

## 2015-09-20 MED ORDER — PANTOPRAZOLE SODIUM 40 MG PO TBEC
40.0000 mg | DELAYED_RELEASE_TABLET | Freq: Once | ORAL | Status: AC
  Administered 2015-09-20: 40 mg via ORAL
  Filled 2015-09-20: qty 1

## 2015-09-20 MED ORDER — AMLODIPINE BESYLATE 10 MG PO TABS
10.0000 mg | ORAL_TABLET | Freq: Every day | ORAL | Status: DC
  Administered 2015-09-20 – 2015-09-21 (×2): 10 mg via ORAL
  Filled 2015-09-20 (×2): qty 1

## 2015-09-20 NOTE — Progress Notes (Signed)
Pt asking for something for acid reflux. Provider Dartha LodgeOgbata was notified. Awaiting further orders. Will continue to monitor pt.

## 2015-09-20 NOTE — Progress Notes (Signed)
Austin Mendez  [id number] DOB: December 22, 1960 DOA: 09/07/2015 PCP: Billee CashingMCKENZIE, WAYLAND, MD    Brief Narrative:  55yo M Hx poorly controlled HTN, ETOH abuse, Substance Abuse, Alcoholism, and Syncope who presented to the ED on 6/16 with 4 d h/o nausea and vomiting with dizziness.  He was hypokalemic, hypomagnesemic, and his BP was elevated requiring IV antihypertensive.   Following admission he became progressively restless and agitated. CIWA protocol was utilized.  Overnight patient had difficulty breathing requring oxygen, was wheezing on exam, CXR revealed atypical infection vs interstitial edema, rocephin changed to IV Zosyn ,.   Subjective: Reports trouble breathing, was trying to get out of bed, slightly cnfused.   Assessment & Plan:  - Alcohol abuse - DC CIWA.   - Hypomagnesemia - continue to monitor - Hypokalemia - Resolved significantly. Potassium is 3.6 today. - Accelerated Hypertension  - Add Norvasc - Dizziness with presyncopal episode - PT/OT input appreciated. - Tobacco abuse  - Counseled to quit tobacco. Nicotine patch - Possible Acute Kidney Injury - Resolved.  - Aspiration pneumonia - Continue antibiotics. Likely DC in am on oral antibiotics. - Hyponatremia - Multifactorial. - UTI secondary to E. Coli - Pan sensitive. Likely DC home in am on oral Augmentin.  Hypoxia - Improved.   DVT prophylaxis: SQ heparin Code Status: FULL CODE Family Communication: no family present at time of exam / called wife over the phone and discussed the plan of care.  Disposition Plan: home 1 to 2 days. Pt refusing SNF.   Consultants:  PCCM  Procedures: TTE - 6/20 - see results above   Antimicrobials:  none   Objective: Blood pressure 173/93, pulse 65, temperature 98.1 F (36.7 C), temperature source Oral, resp. rate 18, height 5\' 6"  (1.676 m), weight 63.64 kg (140 lb 4.8 oz), SpO2 92 %.  Intake/Output Summary (Last 24 hours) at 09/20/15 0951 Last data filed at 09/20/15  0944  Gross per 24 hour  Intake   1636 ml  Output   2650 ml  Net  -1014 ml   Filed Weights   09/18/15 0411 09/19/15 0508 09/20/15 0521  Weight: 64.819 kg (142 lb 14.4 oz) 63.549 kg (140 lb 1.6 oz) 63.64 kg (140 lb 4.8 oz)    Examination: General: Not in distress. Comfortable. Lungs: Mild expiratory wheeze. Decreased air entry. Cardiovascular: S1S2.  Abdomen: Nontender, nondistended, soft, bowel sounds positive, no rebound, no ascites, no appreciable mass Extremities: No leg edema. Neuro - Very mild facial asymmetry. Moves all limbs. Awake and alert. Oriented to time and place.  CBC:  Recent Labs Lab 09/18/15 0336 09/19/15 0537  WBC 14.8* 9.1  NEUTROABS 12.7*  --   HGB 9.9* 10.5*  HCT 28.1* 30.7*  MCV 93.0 90.8  PLT 249 281   Basic Metabolic Panel:  Recent Labs Lab 09/15/15 0418 09/16/15 0234 09/17/15 1303 09/18/15 1228 09/19/15 0537 09/20/15 0654  NA 132* 129* 129* 131* 130* 131*  K 3.7 3.8 4.1 3.8 3.7 3.6  CL 102 98* 98* 100* 97* 99*  CO2 22 22 24 23 24 22   GLUCOSE 91 121* 115* 149* 101* 111*  BUN 14 21* 21* 18 15 14   CREATININE 1.12 1.48* 1.34* 1.28* 1.27* 1.08  CALCIUM 9.2 9.1 9.0 9.1 9.1 9.0  MG 2.0 1.3* 1.9  --  1.5*  --   PHOS  --   --   --   --   --  4.1  4.1   GFR: Estimated Creatinine Clearance: 69.5 mL/min (by  C-G formula based on Cr of 1.08).  Liver Function Tests:  Recent Labs Lab 09/20/15 0654  ALBUMIN 2.2*    HbA1C: HGB A1C MFR BLD  Date/Time Value Ref Range Status  09/07/2015 02:56 PM 4.8 4.8 - 5.6 % Final    Comment:    (NOTE)         Pre-diabetes: 5.7 - 6.4         Diabetes: >6.4         Glycemic control for adults with diabetes: <7.0   10/17/2009 06:11 PM  <5.7 % Final   5.0 (NOTE)                                                                       According to the ADA Clinical Practice Recommendations for 2011, when HbA1c is used as a screening test:   >=6.5%   Diagnostic of Diabetes Mellitus           (if abnormal  result  is confirmed)  5.7-6.4%   Increased risk of developing Diabetes Mellitus  References:Diagnosis and Classification of Diabetes Mellitus,Diabetes Care,2011,34(Suppl 1):S62-S69 and Standards of Medical Care in         Diabetes - 2011,Diabetes Care,2011,34  (Suppl 1):S11-S61.    CBG: No results for input(s): GLUCAP in the last 168 hours.  Recent Results (from the past 240 hour(s))  Culture, blood (routine x 2)     Status: None (Preliminary result)   Collection Time: 09/15/15 11:41 PM  Result Value Ref Range Status   Specimen Description BLOOD LEFT HAND  Final   Special Requests BOTTLES DRAWN AEROBIC AND ANAEROBIC 5CC   Final   Culture NO GROWTH 3 DAYS  Final   Report Status PENDING  Incomplete  Culture, blood (routine x 2)     Status: None (Preliminary result)   Collection Time: 09/15/15 11:41 PM  Result Value Ref Range Status   Specimen Description BLOOD RIGHT ANTECUBITAL  Final   Special Requests BOTTLES DRAWN AEROBIC AND ANAEROBIC 5CC   Final   Culture NO GROWTH 3 DAYS  Final   Report Status PENDING  Incomplete  Culture, Urine     Status: Abnormal   Collection Time: 09/17/15  3:47 PM  Result Value Ref Range Status   Specimen Description URINE, CLEAN CATCH  Final   Special Requests NONE  Final   Culture >=100,000 COLONIES/mL ESCHERICHIA COLI (A)  Final   Report Status 09/19/2015 FINAL  Final   Organism ID, Bacteria ESCHERICHIA COLI (A)  Final      Susceptibility   Escherichia coli - MIC*    AMPICILLIN <=2 SENSITIVE Sensitive     CEFAZOLIN <=4 SENSITIVE Sensitive     CEFTRIAXONE <=1 SENSITIVE Sensitive     CIPROFLOXACIN <=0.25 SENSITIVE Sensitive     GENTAMICIN <=1 SENSITIVE Sensitive     IMIPENEM <=0.25 SENSITIVE Sensitive     NITROFURANTOIN <=16 SENSITIVE Sensitive     TRIMETH/SULFA <=20 SENSITIVE Sensitive     AMPICILLIN/SULBACTAM <=2 SENSITIVE Sensitive     PIP/TAZO <=4 SENSITIVE Sensitive     * >=100,000 COLONIES/mL ESCHERICHIA COLI     Scheduled Meds: .  antiseptic oral rinse  7 mL Mouth Rinse q12n4p  . carvedilol  25 mg Oral BID WC  .  chlorhexidine  15 mL Mouth Rinse BID  . cloNIDine  0.3 mg Oral TID  . feeding supplement (ENSURE ENLIVE)  237 mL Oral BID BM  . folic acid  1 mg Oral Daily  . heparin  5,000 Units Subcutaneous Q8H  . hydrALAZINE  100 mg Oral Q8H  . levalbuterol  0.63 mg Nebulization Once  . levalbuterol  0.63 mg Nebulization BID  . magnesium oxide  400 mg Oral BID  . multivitamin with minerals  1 tablet Oral Daily  . nicotine  21 mg Transdermal Daily  . piperacillin-tazobactam (ZOSYN)  IV  3.375 g Intravenous Q8H  . potassium chloride  40 mEq Oral Daily  . thiamine  100 mg Oral Daily   Continuous Infusions:     LOS: 13 days   Time spent: 25 minutes   Kathlen ModyVijaya Akula,  MD [phone] [facility] Office  [phone] Pager - Text Page per Amion as per below:  On-Call/Text Page:      [email or web address]      password [email or web address]  If 7PM-7AM, please contact night-coverage [email or web address] Password [email or web address] 09/20/2015, 9:51 AM

## 2015-09-20 NOTE — Progress Notes (Signed)
Occupational Therapy Treatment/Discharge Patient Details Name: Austin Mendez MRN: [id number] DOB: 05-21-1960 Today's Date: 09/20/2015    History of present illness 55 y.o. male with medical history significant for poorly controlled HTN, ETOH abuse, presenting to to the ED with nausea and vomiting, diziness with onset about 4 days ago, accompanied by mild epigastric pain without diarrhea,.    OT comments  Pt has progressed very well and reports he is functioning at his baseline. Pt completed all ADLs and transfers including tub transfer at mod I level while holding catheter bag and manuevering IV pole. Educated pt on fall prevention strategies and encouraged him to continue cessation from alcohol and cigarettes. All education has been completed and pt has no further questions. Pt with no further acute OT needs. Updated follow up recommendations to reflect pt's progress. OT signing off.   Follow Up Recommendations  No OT follow up;Supervision - Intermittent    Equipment Recommendations  None recommended by OT    Recommendations for Other Services      Precautions / Restrictions Precautions Precautions: Fall Restrictions Weight Bearing Restrictions: No       Mobility Bed Mobility Overal bed mobility: Modified Independent                Transfers Overall transfer level: Modified independent                    Balance Overall balance assessment: Needs assistance Sitting-balance support: No upper extremity supported;Feet supported Sitting balance-Leahy Scale: Normal     Standing balance support: No upper extremity supported;During functional activity Standing balance-Leahy Scale: Good                     ADL Overall ADL's : Modified independent                                       General ADL Comments: Able to complete all ADLs and transfers including tub transfer at mod I level while holding catheter bag and manuevering IV  pole.      Vision                     Perception     Praxis      Cognition   Behavior During Therapy: WFL for tasks assessed/performed Overall Cognitive Status: Within Functional Limits for tasks assessed                       Extremity/Trunk Assessment               Exercises     Shoulder Instructions       General Comments      Pertinent Vitals/ Pain       Pain Assessment: Faces Pain Score: 2  Pain Location: stomach Pain Descriptors / Indicators: Aching Pain Intervention(s): Monitored during session  Home Living                                          Prior Functioning/Environment              Frequency       Progress Toward Goals  OT Goals(current goals can now be found in the care plan section)  Progress towards OT goals: Goals met/education completed,  patient discharged from OT  Acute Rehab OT Goals Patient Stated Goal: to go home OT Goal Formulation: With patient Time For Goal Achievement: 09/27/15 ADL Goals Pt Will Perform Eating: with modified independence;sitting Pt Will Perform Grooming: with set-up;sitting Pt Will Perform Upper Body Bathing: with supervision;with set-up;sitting Pt Will Perform Lower Body Bathing: with supervision;with set-up;sit to/from stand Pt Will Transfer to Toilet: with min guard assist;ambulating;regular height toilet Additional ADL Goal #1: Demonstrate emergent awareness during ADL tasks in structured environment  Plan Discharge plan needs to be updated;All goals met and education completed, patient discharged from OT services    Co-evaluation                 End of Session Equipment Utilized During Treatment: Gait belt   Activity Tolerance Patient tolerated treatment well   Patient Left in chair;with call bell/phone within reach;with chair alarm set   Nurse Communication Mobility status        Time: 3762-8315 OT Time Calculation (min): 14 min  Charges:  OT General Charges $OT Visit: 1 Procedure OT Treatments $Self Care/Home Management : 8-22 mins  Redmond Baseman, OTR/L Pager: [phone] 09/20/2015, 5:19 PM

## 2015-09-21 DIAGNOSIS — G92 Toxic encephalopathy: Secondary | ICD-10-CM

## 2015-09-21 LAB — CULTURE, BLOOD (ROUTINE X 2)
CULTURE: NO GROWTH
Culture: NO GROWTH

## 2015-09-21 MED ORDER — CLONIDINE HCL 0.3 MG PO TABS: 0.3000 mg | ORAL_TABLET | Freq: Three times a day (TID) | ORAL | Status: DC

## 2015-09-21 MED ORDER — CARVEDILOL 25 MG PO TABS: 25.0000 mg | ORAL_TABLET | Freq: Two times a day (BID) | ORAL | Status: DC

## 2015-09-21 MED ORDER — ADULT MULTIVITAMIN W/MINERALS CH: 1.0000 | ORAL_TABLET | Freq: Every day | ORAL | Status: DC

## 2015-09-21 MED ORDER — ENSURE ENLIVE PO LIQD: 237.0000 mL | Freq: Two times a day (BID) | ORAL | Status: DC

## 2015-09-21 MED ORDER — THIAMINE HCL 100 MG PO TABS: 100.0000 mg | ORAL_TABLET | Freq: Every day | ORAL | Status: DC

## 2015-09-21 MED ORDER — AMOXICILLIN-POT CLAVULANATE 875-125 MG PO TABS
1.0000 | ORAL_TABLET | Freq: Two times a day (BID) | ORAL | Status: AC
Start: 2015-09-21 — End: 2015-09-26

## 2015-09-21 MED ORDER — FOLIC ACID 1 MG PO TABS: 1.0000 mg | ORAL_TABLET | Freq: Every day | ORAL | Status: DC

## 2015-09-21 MED ORDER — HYDRALAZINE HCL 100 MG PO TABS: 100.0000 mg | ORAL_TABLET | Freq: Three times a day (TID) | ORAL | Status: DC

## 2015-09-21 MED ORDER — AMLODIPINE BESYLATE 10 MG PO TABS: 10.0000 mg | ORAL_TABLET | Freq: Every day | ORAL | Status: DC

## 2015-09-21 MED ORDER — MAGNESIUM OXIDE 400 MG PO TABS: 400.0000 mg | ORAL_TABLET | Freq: Two times a day (BID) | ORAL | Status: DC

## 2015-09-21 NOTE — Progress Notes (Signed)
Speech Language Pathology Treatment: Dysphagia  Patient Details Name: Austin RoughWilliam D Neiswender MRN: [id number] DOB: [birth year] Today's Date: 09/21/2015 Time: [id number] SLP Time Calculation (min) (ACUTE ONLY): 11 min  Assessment / Plan / Recommendation Clinical Impression  Visited pt at am meal to reinforce compensatory strategies and rationale. Pt did not recall recommendations without cues. He verbalized understanding of risk of aspiration from reflux and esophageal precautions. Oropharyngeal dysphagia was mild and pt recommended to complete a chin tuck to reduce pharyngeal residuals, but when cued to do so, pt reported increased sensation of residue and was unwilling to carry over the strategy. Pt did conduct effortful second swallow which he felt more successful with. SLP reinforced this strategy. All education complete, will sign off.     HPI HPI: 55yo M Hx poorly controlled HTN, ETOH abuse, Substance Abuse, Alcoholism, and Syncope who presented to the ED on 6/16 with 4 d h/o nausea and vomiting with dizziness. He was hypokalemic, hypomagnesemic, and his BP was elevated requiring IV antihypertensive. Mentation has waxed and waned significantly over the hospitalization with periods of lucidity mixed with periods of severe agitation. On 6/27 in early am pt became short of breath. CXR showed interstitial opacities, possible inflammatory pneumonitis.       SLP Plan  Discharge SLP treatment due to (comment)     Recommendations  Diet recommendations: Regular;Thin liquid Liquids provided via: Cup;Straw Medication Administration: Whole meds with liquid Supervision: Patient able to self feed Compensations: Slow rate;Small sips/bites;Multiple dry swallows after each bite/sip;Effortful swallow Postural Changes and/or Swallow Maneuvers: Seated upright 90 degrees;Upright 30-60 min after meal             Plan: Discharge SLP treatment due to (comment)     GO               Harlon DittyBonnie Jordin Dambrosio, MA  CCC-SLP [phone]  [place] Hagger, Riley NearingBonnie Caroline 09/21/2015, 9:55 AM

## 2015-09-21 NOTE — Progress Notes (Signed)
Nutrition Follow-up  DOCUMENTATION CODES:   Not applicable  INTERVENTION:   -Continue Ensure Enlive po BID, each supplement provides 350 kcal and 20 grams of protein  NUTRITION DIAGNOSIS:   Increased nutrient needs related to acute illness as evidenced by estimated needs.  Resolved  GOAL:   Patient will meet greater than or equal to 90% of their needs  Met  MONITOR:   PO intake, Supplement acceptance, Weight trends, Labs, I & O's  REASON FOR ASSESSMENT:   Consult Poor PO  ASSESSMENT:   55 y.o. male with medical history significant for poorly controlled HTN, ETOH abuse, presenting to to the ED with nausea and vomiting, diziness with onset about 4 days ago, accompanied by mild epigastric pain without diarrhea,.   Spoke with pt at bedside. He reports he continues to feel well with good appetite. Meal completion 50-100%. Noted Ensure supplement at bedside with 100% completion. Pt reports he enjoys supplements and would like to continue them during hospitalization and at home ("I'm gonna get a 6 pack after I leave"). Discussed where pt could purchase supplements for home use.   Pt underwent MBSS on 09/19/15, which recommended regular diet with thin liquids. Pt denies any difficulty tolerating current diet textures.   Labs reviewed: Na: 131, Glucose: 111.  Diet Order:  Diet Heart Room service appropriate?: Yes; Fluid consistency:: Thin Diet - low sodium heart healthy  Skin:  Reviewed, no issues  Last BM:  09/20/15  Height:   Ht Readings from Last 1 Encounters:  09/13/15 _0  (1.676 m)    Weight:   Wt Readings from Last 1 Encounters:  09/21/15 137 lb (62.143 kg)    Ideal Body Weight:  64.5 kg  BMI:  Body mass index is 22.12 kg/(m^2).  Estimated Nutritional Needs:   Kcal:  1800-2000  Protein:  80-90 grams  Fluid:  1.8 - 2 L/day  EDUCATION NEEDS:   No education needs identified at this time  Adara Kittle A. Jimmye [name], RD, LDN, CDE Pager: [phone] After  hours Pager: [phone]

## 2015-09-21 NOTE — Progress Notes (Signed)
CM received consult : PCP at discharge. Pt has PCP, Dr. Billee CashingWayland Mckenzie. Gae Gallopngela Lizann Edelman RN,BSN,CM [phone]

## 2015-09-21 NOTE — Progress Notes (Signed)
Paged provider about pt not having hand written prescription for potassium and promethazine and stated they weren't needed.

## 2015-09-21 NOTE — Progress Notes (Signed)
NURSING PROGRESS NOTE  Austin Mendez [id number] Discharge Data: 09/21/2015 2:37 PM Attending Provider: Barnetta ChapelSylvester I Ogbata, MD WUJ:[id number]:MCKENZIE, Adela LankWAYLAND, MD     Austin Mendez to be D/C'd Home per Ogbata,MD order.  Discussed with the patient the After Visit Summary and all questions fully answered. All IV's discontinued with no bleeding noted. All belongings returned to patient for patient to take home. Pt states he has a primary care provider McKenzie. Pt taken downstairs via wheelchair by a staff member. Pt given all prescrptions.   Last Vital Signs:  Blood pressure 131/81, pulse 60, temperature 98.7 F (37.1 C), temperature source Oral, resp. rate 18, height 5\' 6"  (1.676 m), weight 62.143 kg (137 lb), SpO2 91 %.  Discharge Medication List   Medication List    STOP taking these medications        traMADol 50 MG tablet  Commonly known as:  ULTRAM      TAKE these medications        amLODipine 10 MG tablet  Commonly known as:  NORVASC  Take 1 tablet (10 mg total) by mouth daily.     amoxicillin-clavulanate 875-125 MG tablet  Commonly known as:  AUGMENTIN  Take 1 tablet by mouth 2 (two) times daily.     carvedilol 25 MG tablet  Commonly known as:  COREG  Take 1 tablet (25 mg total) by mouth 2 (two) times daily with a meal.     cloNIDine 0.3 MG tablet  Commonly known as:  CATAPRES  Take 1 tablet (0.3 mg total) by mouth 3 (three) times daily.     feeding supplement (ENSURE ENLIVE) Liqd  Take 237 mLs by mouth 2 (two) times daily between meals.     folic acid 1 MG tablet  Commonly known as:  FOLVITE  Take 1 tablet (1 mg total) by mouth daily.     hydrALAZINE 100 MG tablet  Commonly known as:  APRESOLINE  Take 1 tablet (100 mg total) by mouth every 8 (eight) hours.     magnesium oxide 400 MG tablet  Commonly known as:  MAG-OX  Take 1 tablet (400 mg total) by mouth 2 (two) times daily.     multivitamin with minerals Tabs tablet  Take 1 tablet by mouth daily.     potassium chloride SA 20 MEQ tablet  Commonly known as:  K-DUR,KLOR-CON  Take 1 tablet (20 mEq total) by mouth daily.     promethazine 25 MG tablet  Commonly known as:  PHENERGAN  Take 1 tablet (25 mg total) by mouth every 6 (six) hours as needed for nausea or vomiting.     thiamine 100 MG tablet  Take 1 tablet (100 mg total) by mouth daily.

## 2015-09-21 NOTE — Discharge Summary (Signed)
Physician Discharge Summary  Patient ID: Austin Mendez MRN: [id number] DOB/AGE: 09/19/1960 55 y.o.  Admit date: 09/07/2015 Discharge date: 09/21/2015  Admission Diagnoses:  Discharge Diagnoses:  Principal Problem: Alcohol abuse disorder Aspiration pneumonia   Accelerated hypertension  Active Problems:   Alcohol abuse   Essential hypertension   Syncope and collapse   Hypokalemia   Acute kidney injury (HCC)   Hypomagnesemia   Epigastric pain   ETOH abuse   Uncontrolled hypertension   Noncompliance with treatment   Tobacco abuse   AKI (acute kidney injury) (HCC)   Altered mental status   Discharged Condition: stable  Hospital Course: 55 year old male with medical history significant for poorly controlled Hypertension, alcohol abuse, presented to the ED with nausea and vomiting, dizziness, mild epigastric pain without diarrhea. Patient was admitted for further assessment and management. Patient was managed for alcohol withdrawal syndrome. Further work up revealed aspiration pneumonia and accelerated Hypertension. Patient's BP is now controlled. Aspiration pneumonia was treated with IV Zosyn, and patient will be discharged on oral Augmentin. Patient refused referral to outpatient alcohol rehabilitation program.  Consults: Stable  Significant Diagnostic Studies: CT Chest  Discharge Medication - See Med rec.  Discharge Exam: Blood pressure 131/81, pulse 60, temperature 98.7 F (37.1 C), temperature source Oral, resp. rate 18, height 5\' 6"  (1.676 m), weight 62.143 kg (137 lb), SpO2 91 %.   Disposition: 01-Home or Self Care  Discharge Instructions    Diet - low sodium heart healthy    Complete by:  As directed      Discharge instructions    Complete by:  As directed   Follow up with PCP in one week. Case Management to assist with discharge needs.     Increase activity slowly    Complete by:  As directed             Medication List    STOP taking these  medications        traMADol 50 MG tablet  Commonly known as:  ULTRAM      TAKE these medications        amLODipine 10 MG tablet  Commonly known as:  NORVASC  Take 1 tablet (10 mg total) by mouth daily.     amoxicillin-clavulanate 875-125 MG tablet  Commonly known as:  AUGMENTIN  Take 1 tablet by mouth 2 (two) times daily.     carvedilol 25 MG tablet  Commonly known as:  COREG  Take 1 tablet (25 mg total) by mouth 2 (two) times daily with a meal.     cloNIDine 0.3 MG tablet  Commonly known as:  CATAPRES  Take 1 tablet (0.3 mg total) by mouth 3 (three) times daily.     feeding supplement (ENSURE ENLIVE) Liqd  Take 237 mLs by mouth 2 (two) times daily between meals.     folic acid 1 MG tablet  Commonly known as:  FOLVITE  Take 1 tablet (1 mg total) by mouth daily.     hydrALAZINE 100 MG tablet  Commonly known as:  APRESOLINE  Take 1 tablet (100 mg total) by mouth every 8 (eight) hours.     magnesium oxide 400 MG tablet  Commonly known as:  MAG-OX  Take 1 tablet (400 mg total) by mouth 2 (two) times daily.     multivitamin with minerals Tabs tablet  Take 1 tablet by mouth daily.     potassium chloride SA 20 MEQ tablet  Commonly known as:  K-DUR,KLOR-CON  Take  1 tablet (20 mEq total) by mouth daily.     promethazine 25 MG tablet  Commonly known as:  PHENERGAN  Take 1 tablet (25 mg total) by mouth every 6 (six) hours as needed for nausea or vomiting.     thiamine 100 MG tablet  Take 1 tablet (100 mg total) by mouth daily.           Follow-up Information    Follow up with Billee CashingMCKENZIE, WAYLAND, MD In 3 days.   Specialty:  Family Medicine   Contact information:   [address] [place] [address] [address] [phone]       [facility]   Specialty:  Emergency Medicine   Why:  If symptoms worsen   Contact information:   [address] [id number] mc [place] [phone]       Signed: Barnetta ChapelSylvester I Yomaris Palecek 09/21/2015, 11:07 AM

## 2015-09-21 NOTE — Progress Notes (Signed)
   09/21/15 0900  Clinical Encounter Type  Visited With Patient  Visit Type Initial  Spiritual Encounters  Spiritual Needs Emotional  Stress Factors  Patient Stress Factors None identified   Chaplain visited with patient while doing rounds on the floor.  Patient was in good mood and planning on leaving soon.  Chaplain offered ministry of presence to patient and spoke to him about family.  Rosezella FloridaLisa M Laylia Mui 09/21/2015 9:56 AM

## 2015-10-06 ENCOUNTER — Emergency Department (HOSPITAL_COMMUNITY): Payer: Medicaid Other

## 2015-10-06 ENCOUNTER — Encounter (HOSPITAL_COMMUNITY): Payer: Self-pay | Admitting: Emergency Medicine

## 2015-10-06 ENCOUNTER — Emergency Department (HOSPITAL_COMMUNITY)
Admission: EM | Admit: 2015-10-06 | Discharge: 2015-10-06 | Disposition: A | Discharge status: Home / Self Care | Payer: Medicaid Other | Attending: Emergency Medicine | Admitting: Emergency Medicine

## 2015-10-06 DIAGNOSIS — F17210 Nicotine dependence, cigarettes, uncomplicated: Secondary | ICD-10-CM | POA: Diagnosis not present

## 2015-10-06 DIAGNOSIS — Y929 Unspecified place or not applicable: Secondary | ICD-10-CM | POA: Insufficient documentation

## 2015-10-06 DIAGNOSIS — W1839XA Other fall on same level, initial encounter: Secondary | ICD-10-CM | POA: Insufficient documentation

## 2015-10-06 DIAGNOSIS — Z79899 Other long term (current) drug therapy: Secondary | ICD-10-CM | POA: Insufficient documentation

## 2015-10-06 DIAGNOSIS — F1290 Cannabis use, unspecified, uncomplicated: Secondary | ICD-10-CM | POA: Diagnosis not present

## 2015-10-06 DIAGNOSIS — I10 Essential (primary) hypertension: Secondary | ICD-10-CM | POA: Diagnosis not present

## 2015-10-06 DIAGNOSIS — Y9389 Activity, other specified: Secondary | ICD-10-CM | POA: Insufficient documentation

## 2015-10-06 DIAGNOSIS — Y999 Unspecified external cause status: Secondary | ICD-10-CM | POA: Insufficient documentation

## 2015-10-06 DIAGNOSIS — R55 Syncope and collapse: Secondary | ICD-10-CM | POA: Insufficient documentation

## 2015-10-06 DIAGNOSIS — W19XXXA Unspecified fall, initial encounter: Secondary | ICD-10-CM

## 2015-10-06 LAB — CBC WITH DIFFERENTIAL/PLATELET
Basophils Absolute: 0 10*3/uL (ref 0.0–0.1)
Basophils Relative: 1 %
EOS PCT: 6 %
Eosinophils Absolute: 0.3 10*3/uL (ref 0.0–0.7)
HCT: 33.4 % — ABNORMAL LOW (ref 39.0–52.0)
Hemoglobin: 11.7 g/dL — ABNORMAL LOW (ref 13.0–17.0)
LYMPHS ABS: 1.9 10*3/uL (ref 0.7–4.0)
LYMPHS PCT: 35 %
MCH: 31.7 pg (ref 26.0–34.0)
MCHC: 35 g/dL (ref 30.0–36.0)
MCV: 90.5 fL (ref 78.0–100.0)
MONO ABS: 0.7 10*3/uL (ref 0.1–1.0)
MONOS PCT: 13 %
Neutro Abs: 2.5 10*3/uL (ref 1.7–7.7)
Neutrophils Relative %: 47 %
PLATELETS: 369 10*3/uL (ref 150–400)
RBC: 3.69 MIL/uL — ABNORMAL LOW (ref 4.22–5.81)
RDW: 14.6 % (ref 11.5–15.5)
WBC: 5.5 10*3/uL (ref 4.0–10.5)

## 2015-10-06 LAB — COMPREHENSIVE METABOLIC PANEL
ALT: 49 U/L (ref 17–63)
AST: 54 U/L — ABNORMAL HIGH (ref 15–41)
Albumin: 3.4 g/dL — ABNORMAL LOW (ref 3.5–5.0)
Alkaline Phosphatase: 129 U/L — ABNORMAL HIGH (ref 38–126)
Anion gap: 11 (ref 5–15)
BUN: 8 mg/dL (ref 6–20)
CALCIUM: 9.5 mg/dL (ref 8.9–10.3)
CO2: 23 mmol/L (ref 22–32)
CREATININE: 1.18 mg/dL (ref 0.61–1.24)
Chloride: 101 mmol/L (ref 101–111)
Glucose, Bld: 118 mg/dL — ABNORMAL HIGH (ref 65–99)
Potassium: 3 mmol/L — ABNORMAL LOW (ref 3.5–5.1)
Sodium: 135 mmol/L (ref 135–145)
Total Bilirubin: 0.8 mg/dL (ref 0.3–1.2)
Total Protein: 8.6 g/dL — ABNORMAL HIGH (ref 6.5–8.1)

## 2015-10-06 LAB — I-STAT TROPONIN, ED: TROPONIN I, POC: 0 ng/mL (ref 0.00–0.08)

## 2015-10-06 LAB — ETHANOL: Alcohol, Ethyl (B): 20 mg/dL — ABNORMAL HIGH (ref <5)

## 2015-10-06 LAB — TROPONIN I

## 2015-10-06 MED ORDER — SODIUM CHLORIDE 0.9 % IV BOLUS (SEPSIS)
1000.0000 mL | Freq: Once | INTRAVENOUS | Status: AC
  Administered 2015-10-06: 1000 mL via INTRAVENOUS

## 2015-10-06 MED ORDER — SODIUM CHLORIDE 0.9 % IV SOLN: INTRAVENOUS | Status: DC

## 2015-10-06 MED ORDER — ASPIRIN 81 MG PO CHEW
CHEWABLE_TABLET | ORAL | Status: AC
  Filled 2015-10-06: qty 4

## 2015-10-06 MED ORDER — ASPIRIN 81 MG PO CHEW
324.0000 mg | CHEWABLE_TABLET | Freq: Once | ORAL | Status: AC
  Administered 2015-10-06: 324 mg via ORAL

## 2015-10-06 MED ORDER — SODIUM CHLORIDE 0.9 % IV SOLN
INTRAVENOUS | Status: DC
  Administered 2015-10-06: 13:00:00 via INTRAVENOUS

## 2015-10-06 MED ORDER — POTASSIUM CHLORIDE CRYS ER 20 MEQ PO TBCR
40.0000 meq | EXTENDED_RELEASE_TABLET | Freq: Once | ORAL | Status: AC
  Administered 2015-10-06: 40 meq via ORAL
  Filled 2015-10-06: qty 2

## 2015-10-06 NOTE — ED Notes (Signed)
Pt given turkey sandwich

## 2015-10-06 NOTE — Discharge Instructions (Signed)

## 2015-10-06 NOTE — ED Provider Notes (Signed)
CSN: [id number]     Arrival date & time 10/06/15  0941 History   First MD Initiated Contact with Patient 10/06/15 [phone]     Chief Complaint  Patient presents with  . Fall  . Alcohol Intoxication     (Consider location/radiation/quality/duration/timing/severity/associated sxs/prior Treatment) HPI Comments: 55 year old male presents after having a syncopal event just prior to arrival. Patient does admit to alcohol use today. States became dizzy and lightheaded and fell when he went to stand up from a seated position. He denies any chest pain or discomfort. No recent dyspnea or diaphoresis. Did strike his head. Denies any recent blood loss. No chest or abdominal discomfort. No weakness in his arms or legs. States he does have a history of high blood pressure but denies any recent medication changes. EMS called and patient transported here  Patient is a 55 y.o. male presenting with fall and intoxication. The history is provided by the patient.  Fall  Alcohol Intoxication    Past Medical History  Diagnosis Date  . Hypertension   . Syncope and collapse   . Dizziness and giddiness   . Shortness of breath   . Unspecified essential hypertension    History reviewed. No pertinent past surgical history. Family History  Problem Relation Age of Onset  . Hypertension    . Cancer Mother   . Diabetes Father    Social History  Substance Use Topics  . Smoking status: Current Every Day Smoker -- 10.00 packs/day    Types: Cigarettes  . Smokeless tobacco: None  . Alcohol Use: 0.0 oz/week    0 Standard drinks or equivalent per week     Comment: Smells of ETOH  St's just stopped drinking    Review of Systems  All other systems reviewed and are negative.     Allergies  Review of patient's allergies indicates no known allergies.  Home Medications   Prior to Admission medications   Medication Sig Start Date End Date Taking? Authorizing Provider  amLODipine (NORVASC) 10 MG tablet Take 1  tablet (10 mg total) by mouth daily. 09/21/15   Barnetta ChapelSylvester I Ogbata, MD  carvedilol (COREG) 25 MG tablet Take 1 tablet (25 mg total) by mouth 2 (two) times daily with a meal. 09/21/15   Barnetta ChapelSylvester I Ogbata, MD  cloNIDine (CATAPRES) 0.3 MG tablet Take 1 tablet (0.3 mg total) by mouth 3 (three) times daily. 09/21/15   Barnetta ChapelSylvester I Ogbata, MD  feeding supplement, ENSURE ENLIVE, (ENSURE ENLIVE) LIQD Take 237 mLs by mouth 2 (two) times daily between meals. 09/21/15   Barnetta ChapelSylvester I Ogbata, MD  folic acid (FOLVITE) 1 MG tablet Take 1 tablet (1 mg total) by mouth daily. 09/21/15   Barnetta ChapelSylvester I Ogbata, MD  hydrALAZINE (APRESOLINE) 100 MG tablet Take 1 tablet (100 mg total) by mouth every 8 (eight) hours. 09/21/15   Barnetta ChapelSylvester I Ogbata, MD  magnesium oxide (MAG-OX) 400 MG tablet Take 1 tablet (400 mg total) by mouth 2 (two) times daily. 09/21/15   Barnetta ChapelSylvester I Ogbata, MD  Multiple Vitamin (MULTIVITAMIN WITH MINERALS) TABS tablet Take 1 tablet by mouth daily. 09/21/15   Barnetta ChapelSylvester I Ogbata, MD  potassium chloride SA (K-DUR,KLOR-CON) 20 MEQ tablet Take 1 tablet (20 mEq total) by mouth daily. 09/07/15   Nicole Pisciotta, PA-C  promethazine (PHENERGAN) 25 MG tablet Take 1 tablet (25 mg total) by mouth every 6 (six) hours as needed for nausea or vomiting. 09/07/15   Joni ReiningNicole Pisciotta, PA-C  thiamine 100 MG tablet Take 1 tablet (100  mg total) by mouth daily. 09/21/15   Barnetta Chapel, MD   BP 90/57 mmHg  Pulse 55  Temp(Src) 97.5 F (36.4 C) (Oral)  Resp 18  SpO2 95% Physical Exam  Constitutional: He is oriented to person, place, and time. He appears well-developed and well-nourished.  Non-toxic appearance. No distress.  HENT:  Head: Normocephalic and atraumatic.  Eyes: Conjunctivae, EOM and lids are normal. Pupils are equal, round, and reactive to light.  Neck: Normal range of motion. Neck supple. No tracheal deviation present. No thyroid mass present.  Cardiovascular: Normal rate, regular rhythm and normal heart sounds.   Exam reveals no gallop.   No murmur heard. Pulmonary/Chest: Effort normal and breath sounds normal. No stridor. No respiratory distress. He has no decreased breath sounds. He has no wheezes. He has no rhonchi. He has no rales.  Abdominal: Soft. Normal appearance and bowel sounds are normal. He exhibits no distension. There is no tenderness. There is no rebound and no CVA tenderness.  Musculoskeletal: Normal range of motion. He exhibits no edema or tenderness.  Neurological: He is alert and oriented to person, place, and time. He has normal strength. No cranial nerve deficit or sensory deficit. GCS eye subscore is 4. GCS verbal subscore is 5. GCS motor subscore is 6.  Skin: Skin is warm and dry. No abrasion and no rash noted.  Psychiatric: He has a normal mood and affect. His speech is normal and behavior is normal.  Nursing note and vitals reviewed.   ED Course  Procedures (including critical care time) Labs Review Labs Reviewed  CBC WITH DIFFERENTIAL/PLATELET  COMPREHENSIVE METABOLIC PANEL  ETHANOL  TROPONIN I    Imaging Review No results found. I have personally reviewed and evaluated these images and lab results as part of my medical decision-making.   EKG Interpretation   Date/Time:  Saturday October 06 2015 10:20:03 EDT Ventricular Rate:  55 PR Interval:    QRS Duration: 110 QT Interval:  466 QTC Calculation: 446 R Axis:   79 Text Interpretation:  Sinus rhythm Left ventricular hypertrophy Early  repolarization similar to ecg on 08/01/99 Confirmed by Tarica Harl  MD, Tammey Deeg  ([id number]) on 10/06/2015 10:32:40 AM      MDM   Final diagnoses:  None   10:35 AM  Patient's first EKG was suspicious for ACS but when compared to prior EKGs is unchanged. Patient is asymptomatic. Discussed with Dr. [place], cardiology on-call, who agrees that this is not a STEMI advises to can continue with workup at this time.  1:21 PM Patient given IV fluids here for his likely dehydration due to  alcohol use. His troponin is negative. His EKG likely is from early repolarization. Patient's potassium supplemented. He is not orthostatic after IV hydration and will be discharged home  Lorre Nick, MD 10/06/15 1322

## 2015-10-06 NOTE — ED Notes (Signed)
Pt with alcohol consumption last night and this morning. Pt fell this morning, abrasion to L forearm, pt reports he hits his head.

## 2015-10-06 NOTE — ED Notes (Signed)
PT DISCHARGED. INSTRUCTIONS GIVEN. AAOX4. PT IN NO APPARENT DISTRESS OR PAIN. THE OPPORTUNITY TO ASK QUESTIONS WAS PROVIDED. 

## 2015-10-06 NOTE — ED Notes (Signed)
Bed: WA03 Expected date:  Expected time:  Means of arrival:  Comments: 55 yo fall, leg pain

## 2015-10-06 NOTE — ED Notes (Signed)
Patient transported to CT 

## 2015-10-06 NOTE — ED Notes (Signed)
BED BUGS FOUND ON PT. CHARGE NURSE MADE AWARE.

## 2015-11-19 ENCOUNTER — Emergency Department (HOSPITAL_COMMUNITY): Payer: Medicaid Other

## 2015-11-19 ENCOUNTER — Encounter (HOSPITAL_COMMUNITY): Payer: Self-pay

## 2015-11-19 ENCOUNTER — Observation Stay (HOSPITAL_COMMUNITY)
Admission: EM | Admit: 2015-11-19 | Discharge: 2015-11-20 | Disposition: A | Discharge status: Home / Self Care | Payer: Medicaid Other | Attending: Internal Medicine | Admitting: Internal Medicine

## 2015-11-19 DIAGNOSIS — R400 Somnolence: Secondary | ICD-10-CM

## 2015-11-19 DIAGNOSIS — Z79899 Other long term (current) drug therapy: Secondary | ICD-10-CM | POA: Diagnosis not present

## 2015-11-19 DIAGNOSIS — F17210 Nicotine dependence, cigarettes, uncomplicated: Secondary | ICD-10-CM | POA: Diagnosis not present

## 2015-11-19 DIAGNOSIS — N179 Acute kidney failure, unspecified: Secondary | ICD-10-CM | POA: Diagnosis present

## 2015-11-19 DIAGNOSIS — R55 Syncope and collapse: Secondary | ICD-10-CM | POA: Diagnosis not present

## 2015-11-19 DIAGNOSIS — F1010 Alcohol abuse, uncomplicated: Secondary | ICD-10-CM | POA: Diagnosis not present

## 2015-11-19 DIAGNOSIS — R0602 Shortness of breath: Secondary | ICD-10-CM | POA: Diagnosis present

## 2015-11-19 DIAGNOSIS — R4182 Altered mental status, unspecified: Secondary | ICD-10-CM | POA: Diagnosis not present

## 2015-11-19 DIAGNOSIS — I10 Essential (primary) hypertension: Secondary | ICD-10-CM | POA: Insufficient documentation

## 2015-11-19 DIAGNOSIS — R42 Dizziness and giddiness: Secondary | ICD-10-CM | POA: Diagnosis not present

## 2015-11-19 LAB — TROPONIN I
Troponin I: <0.03 ng/mL (ref <0.03)
Troponin I: <0.03 ng/mL (ref <0.03)

## 2015-11-19 LAB — CBC WITH DIFFERENTIAL/PLATELET
Basophils Absolute: 0 10*3/uL (ref 0.0–0.1)
Basophils Relative: 1 %
EOS ABS: 0.2 10*3/uL (ref 0.0–0.7)
Eosinophils Relative: 5 %
HCT: 34.1 % — ABNORMAL LOW (ref 39.0–52.0)
HEMOGLOBIN: 11.5 g/dL — AB (ref 13.0–17.0)
LYMPHS ABS: 1.7 10*3/uL (ref 0.7–4.0)
Lymphocytes Relative: 41 %
MCH: 30.2 pg (ref 26.0–34.0)
MCHC: 33.7 g/dL (ref 30.0–36.0)
MCV: 89.5 fL (ref 78.0–100.0)
MONOS PCT: 7 %
Monocytes Absolute: 0.3 10*3/uL (ref 0.1–1.0)
NEUTROS PCT: 46 %
Neutro Abs: 1.8 10*3/uL (ref 1.7–7.7)
Platelets: 260 10*3/uL (ref 150–400)
RBC: 3.81 MIL/uL — ABNORMAL LOW (ref 4.22–5.81)
RDW: 15.2 % (ref 11.5–15.5)
WBC: 4.1 10*3/uL (ref 4.0–10.5)

## 2015-11-19 LAB — TSH: TSH: 2.295 u[IU]/mL (ref 0.350–4.500)

## 2015-11-19 LAB — RAPID URINE DRUG SCREEN, HOSP PERFORMED
Amphetamines: NOT DETECTED
BENZODIAZEPINES: NOT DETECTED
Barbiturates: NOT DETECTED
Cocaine: NOT DETECTED
Opiates: NOT DETECTED
Tetrahydrocannabinol: NOT DETECTED

## 2015-11-19 LAB — CBC
HEMATOCRIT: 35 % — AB (ref 39.0–52.0)
HEMOGLOBIN: 11.8 g/dL — AB (ref 13.0–17.0)
MCH: 30.5 pg (ref 26.0–34.0)
MCHC: 33.7 g/dL (ref 30.0–36.0)
MCV: 90.4 fL (ref 78.0–100.0)
Platelets: 254 10*3/uL (ref 150–400)
RBC: 3.87 MIL/uL — ABNORMAL LOW (ref 4.22–5.81)
RDW: 15.3 % (ref 11.5–15.5)
WBC: 4.1 10*3/uL (ref 4.0–10.5)

## 2015-11-19 LAB — COMPREHENSIVE METABOLIC PANEL
ALK PHOS: 239 U/L — AB (ref 38–126)
ALT: 98 U/L — ABNORMAL HIGH (ref 17–63)
ANION GAP: 8 (ref 5–15)
AST: 196 U/L — ABNORMAL HIGH (ref 15–41)
Albumin: 3.2 g/dL — ABNORMAL LOW (ref 3.5–5.0)
BILIRUBIN TOTAL: 1.3 mg/dL — AB (ref 0.3–1.2)
BUN: <5 mg/dL — ABNORMAL LOW (ref 6–20)
CALCIUM: 9.5 mg/dL (ref 8.9–10.3)
CO2: 26 mmol/L (ref 22–32)
Chloride: 102 mmol/L (ref 101–111)
Creatinine, Ser: 1.39 mg/dL — ABNORMAL HIGH (ref 0.61–1.24)
GFR, EST NON AFRICAN AMERICAN: 56 mL/min — AB (ref >60)
Glucose, Bld: 119 mg/dL — ABNORMAL HIGH (ref 65–99)
Potassium: 3.6 mmol/L (ref 3.5–5.1)
SODIUM: 136 mmol/L (ref 135–145)
TOTAL PROTEIN: 8.2 g/dL — AB (ref 6.5–8.1)

## 2015-11-19 LAB — CREATININE, SERUM
CREATININE: 1.41 mg/dL — AB (ref 0.61–1.24)
GFR calc Af Amer: >60 mL/min (ref >60)
GFR calc non Af Amer: 55 mL/min — ABNORMAL LOW (ref >60)

## 2015-11-19 LAB — ETHANOL
Alcohol, Ethyl (B): <5 mg/dL (ref <5)
Alcohol, Ethyl (B): <5 mg/dL (ref <5)

## 2015-11-19 LAB — MAGNESIUM: Magnesium: 1.7 mg/dL (ref 1.7–2.4)

## 2015-11-19 LAB — LIPASE, BLOOD: LIPASE: 22 U/L (ref 11–51)

## 2015-11-19 LAB — I-STAT TROPONIN, ED
TROPONIN I, POC: 0.01 ng/mL (ref 0.00–0.08)
TROPONIN I, POC: 0.01 ng/mL (ref 0.00–0.08)

## 2015-11-19 MED ORDER — ACETAMINOPHEN 325 MG PO TABS: 650.0000 mg | ORAL_TABLET | Freq: Four times a day (QID) | ORAL | Status: DC | PRN

## 2015-11-19 MED ORDER — ONDANSETRON HCL 4 MG/2ML IJ SOLN: 4.0000 mg | Freq: Four times a day (QID) | INTRAMUSCULAR | Status: DC | PRN

## 2015-11-19 MED ORDER — ONDANSETRON HCL 4 MG PO TABS
4.0000 mg | ORAL_TABLET | Freq: Four times a day (QID) | ORAL | Status: DC | PRN
  Administered 2015-11-19: 4 mg via ORAL
  Filled 2015-11-19: qty 1

## 2015-11-19 MED ORDER — LORAZEPAM 2 MG/ML IJ SOLN: 1.0000 mg | Freq: Four times a day (QID) | INTRAMUSCULAR | Status: DC | PRN

## 2015-11-19 MED ORDER — MAGNESIUM SULFATE 2 GM/50ML IV SOLN
2.0000 g | Freq: Once | INTRAVENOUS | Status: AC
  Administered 2015-11-19: 2 g via INTRAVENOUS
  Filled 2015-11-19: qty 50

## 2015-11-19 MED ORDER — THIAMINE HCL 100 MG PO TABS: 100.0000 mg | ORAL_TABLET | Freq: Every day | ORAL | Status: DC

## 2015-11-19 MED ORDER — SODIUM CHLORIDE 0.9 % IV SOLN
INTRAVENOUS | Status: AC
  Administered 2015-11-19 – 2015-11-20 (×2): via INTRAVENOUS

## 2015-11-19 MED ORDER — ENOXAPARIN SODIUM 40 MG/0.4ML ~~LOC~~ SOLN
40.0000 mg | SUBCUTANEOUS | Status: DC
  Administered 2015-11-19: 40 mg via SUBCUTANEOUS
  Filled 2015-11-19: qty 0.4

## 2015-11-19 MED ORDER — FOLIC ACID 1 MG PO TABS
1.0000 mg | ORAL_TABLET | Freq: Every day | ORAL | Status: DC
  Administered 2015-11-20: 1 mg via ORAL
  Filled 2015-11-19: qty 1

## 2015-11-19 MED ORDER — ACETAMINOPHEN 650 MG RE SUPP: 650.0000 mg | Freq: Four times a day (QID) | RECTAL | Status: DC | PRN

## 2015-11-19 MED ORDER — SODIUM CHLORIDE 0.9% FLUSH
3.0000 mL | Freq: Two times a day (BID) | INTRAVENOUS | Status: DC
  Administered 2015-11-19: 3 mL via INTRAVENOUS

## 2015-11-19 MED ORDER — ADULT MULTIVITAMIN W/MINERALS CH: 1.0000 | ORAL_TABLET | Freq: Every day | ORAL | Status: DC

## 2015-11-19 MED ORDER — MAGNESIUM OXIDE 400 (241.3 MG) MG PO TABS
400.0000 mg | ORAL_TABLET | Freq: Two times a day (BID) | ORAL | Status: DC
  Administered 2015-11-19 – 2015-11-20 (×2): 400 mg via ORAL
  Filled 2015-11-19 (×2): qty 1

## 2015-11-19 MED ORDER — NALOXONE HCL 0.4 MG/ML IJ SOLN
0.4000 mg | Freq: Once | INTRAMUSCULAR | Status: AC
  Administered 2015-11-19: 0.4 mg via INTRAVENOUS

## 2015-11-19 MED ORDER — GABAPENTIN 300 MG PO CAPS
300.0000 mg | ORAL_CAPSULE | Freq: Three times a day (TID) | ORAL | Status: DC
  Administered 2015-11-19 – 2015-11-20 (×2): 300 mg via ORAL
  Filled 2015-11-19 (×2): qty 1

## 2015-11-19 MED ORDER — LEVALBUTEROL HCL 0.63 MG/3ML IN NEBU: 0.6300 mg | INHALATION_SOLUTION | Freq: Four times a day (QID) | RESPIRATORY_TRACT | Status: DC | PRN

## 2015-11-19 MED ORDER — FOLIC ACID 1 MG PO TABS: 1.0000 mg | ORAL_TABLET | Freq: Every day | ORAL | Status: DC

## 2015-11-19 MED ORDER — ADULT MULTIVITAMIN W/MINERALS CH
1.0000 | ORAL_TABLET | Freq: Every day | ORAL | Status: DC
  Administered 2015-11-20: 1 via ORAL
  Filled 2015-11-19: qty 1

## 2015-11-19 MED ORDER — THIAMINE HCL 100 MG/ML IJ SOLN: 100.0000 mg | Freq: Every day | INTRAMUSCULAR | Status: DC

## 2015-11-19 MED ORDER — NALOXONE HCL 0.4 MG/ML IJ SOLN
0.4000 mg | INTRAMUSCULAR | Status: DC | PRN
  Administered 2015-11-19: 0.4 mg via INTRAVENOUS
  Filled 2015-11-19: qty 1

## 2015-11-19 MED ORDER — SODIUM CHLORIDE 0.9 % IV BOLUS (SEPSIS)
1000.0000 mL | Freq: Once | INTRAVENOUS | Status: AC
  Administered 2015-11-19: 1000 mL via INTRAVENOUS

## 2015-11-19 MED ORDER — VITAMIN B-1 100 MG PO TABS
100.0000 mg | ORAL_TABLET | Freq: Every day | ORAL | Status: DC
  Administered 2015-11-20: 100 mg via ORAL
  Filled 2015-11-19: qty 1

## 2015-11-19 MED ORDER — NALOXONE HCL 0.4 MG/ML IJ SOLN
INTRAMUSCULAR | Status: AC
  Filled 2015-11-19: qty 1

## 2015-11-19 MED ORDER — POTASSIUM CHLORIDE CRYS ER 20 MEQ PO TBCR: 20.0000 meq | EXTENDED_RELEASE_TABLET | Freq: Every day | ORAL | Status: DC

## 2015-11-19 MED ORDER — PANTOPRAZOLE SODIUM 40 MG PO TBEC
40.0000 mg | DELAYED_RELEASE_TABLET | Freq: Every day | ORAL | Status: DC
  Administered 2015-11-20: 40 mg via ORAL
  Filled 2015-11-19: qty 1

## 2015-11-19 MED ORDER — LORAZEPAM 1 MG PO TABS
1.0000 mg | ORAL_TABLET | Freq: Four times a day (QID) | ORAL | Status: DC | PRN
  Administered 2015-11-19 – 2015-11-20 (×2): 1 mg via ORAL
  Filled 2015-11-19 (×2): qty 1

## 2015-11-19 NOTE — ED Triage Notes (Signed)
Pt presents as possible STEMI. States was outside when he began feeling lightheaded/dizzy,SOB, N/V. Pt. States chest pain began following this symptoms. Denies ETOH/Drug use. Pt. Given 324 ASA PTA. Initial BP 84 palp.

## 2015-11-19 NOTE — H&P (Signed)
[facility] History and Physical  Austin Mendez KGM:[id number] DOB: 20-Oct-1960 DOA: 11/19/2015  Referring physician: ER PCP: Billee CashingMCKENZIE, WAYLAND, MD   Chief Complaint: *Syncope   HPI:  55 y.o. male with medical history significant for  poorly controlled HTN, ETOH abuse, presenting to to the ED with syncope, lightheadedness, possible STEMI. Patient states that he woke up to have breakfast and to call his blood pressure medications. He is not sure what happened. Brought in via EMS, EMS was called out for acute onset of shortness of breath lightheadedness and diaphoresis. Upon arrival the patient was found to be diaphoretic, EKG concerning for possible STEMI. The patient was resuscitated with 500 mL of IV fluids, given 325 of aspirin, code STEMI activated. Upon arrival the patient's EKG for findings are found to be unchanged. Troponin was negative. Patient is noted to be on 4 antihypertensive medications. Also of note is that the patient is a heavy alcoholic but states that he only had one can of beer yesterday. In the ER patient was found to be orthostatic with a 20 point drop in systolic blood pressure. Dehydrated with increasing creatinine from 1.18-1.39. Point-of-care Cardiac enzymes negative 2. UDS negative. CT scan of the head negative. Chest x-ray negative.      Review of Systems: negative for the following  Constitutional: Positive for diaphoresis. Negative for fever.  Eyes: Negative for visual disturbance.  Respiratory: Positive for shortness of breath.   Cardiovascular: Negative for chest pain.  Gastrointestinal: Positive for nausea. Negative for abdominal pain and vomiting.  Skin: Negative for rash.  Allergic/Immunologic: Negative for immunocompromised state.  Neurological: Positive for light-headedness. Negative for weakness.  Hematological: Does not bruise/bleed easily.  Psychiatric/Behavioral: Positive for confusion.  mood changes, confusion, nervousness, sleep  disturbance and agitation       Past Medical History:  Diagnosis Date  . Dizziness and giddiness   . Hypertension   . Shortness of breath   . Syncope and collapse   . Unspecified essential hypertension      History reviewed. No pertinent surgical history.    Social History:  reports that he has been smoking Cigarettes.  He has been smoking about 10.00 packs per day. He does not have any smokeless tobacco history on file. He reports that he drinks alcohol. He reports that he uses drugs, including Marijuana. c  No Known Allergies  Family History  Problem Relation Age of Onset  . Cancer Mother   . Diabetes Father   . Hypertension           Prior to Admission medications   Medication Sig Start Date End Date Taking? Authorizing Provider  amLODipine (NORVASC) 10 MG tablet Take 1 tablet (10 mg total) by mouth daily. 09/21/15  Yes Barnetta ChapelSylvester I Ogbata, MD  carvedilol (COREG) 25 MG tablet Take 1 tablet (25 mg total) by mouth 2 (two) times daily with a meal. 09/21/15  Yes Barnetta ChapelSylvester I Ogbata, MD  cloNIDine (CATAPRES) 0.3 MG tablet Take 1 tablet (0.3 mg total) by mouth 3 (three) times daily. 09/21/15  Yes Barnetta ChapelSylvester I Ogbata, MD  folic acid (FOLVITE) 1 MG tablet Take 1 tablet (1 mg total) by mouth daily. 09/21/15  Yes Barnetta ChapelSylvester I Ogbata, MD  gabapentin (NEURONTIN) 300 MG capsule Take 300 mg by mouth 3 (three) times daily.   Yes Historical Provider, MD  hydrALAZINE (APRESOLINE) 100 MG tablet Take 1 tablet (100 mg total) by mouth every 8 (eight) hours. 09/21/15  Yes Barnetta ChapelSylvester I Ogbata, MD  magnesium oxide (MAG-OX)  400 MG tablet Take 1 tablet (400 mg total) by mouth 2 (two) times daily. 09/21/15  Yes Barnetta Chapel, MD  Multiple Vitamin (MULTIVITAMIN WITH MINERALS) TABS tablet Take 1 tablet by mouth daily. 09/21/15  Yes Barnetta Chapel, MD  omeprazole (PRILOSEC) 20 MG capsule Take 20 mg by mouth daily.   Yes Historical Provider, MD  potassium chloride SA (K-DUR,KLOR-CON) 20 MEQ tablet  Take 1 tablet (20 mEq total) by mouth daily. 09/07/15  Yes Nicole Pisciotta, PA-C  thiamine 100 MG tablet Take 1 tablet (100 mg total) by mouth daily. 09/21/15  Yes Barnetta Chapel, MD  feeding supplement, ENSURE ENLIVE, (ENSURE ENLIVE) LIQD Take 237 mLs by mouth 2 (two) times daily between meals. Patient not taking: Reported on 10/06/2015 09/21/15   Barnetta Chapel, MD  promethazine (PHENERGAN) 25 MG tablet Take 1 tablet (25 mg total) by mouth every 6 (six) hours as needed for nausea or vomiting. 09/07/15   Wynetta Emery, PA-C     Physical Exam: Vitals:   11/19/15 1330 11/19/15 1400 11/19/15 1446 11/19/15 1530  BP: 96/74 108/81 117/89 144/99  Pulse: (!) 59 62 60 67  Resp: 10 22 11 14   Temp:      TempSrc:      SpO2: 100% 100% 100% 98%  Weight:      Height:          Constitutional: NAD, calm, comfortable Vitals:   11/19/15 1330 11/19/15 1400 11/19/15 1446 11/19/15 1530  BP: 96/74 108/81 117/89 144/99  Pulse: (!) 59 62 60 67  Resp: 10 22 11 14   Temp:      TempSrc:      SpO2: 100% 100% 100% 98%  Weight:      Height:       Eyes: PERRL, lids and conjunctivae normal ENMT: Mucous membranes are moist. Posterior pharynx clear of any exudate or lesions.Normal dentition.  Neck: normal, supple, no masses, no thyromegaly Respiratory: clear to auscultation bilaterally, no wheezing, no crackles. Normal respiratory effort. No accessory muscle use.  Cardiovascular: Regular rate and rhythm, no murmurs / rubs / gallops. No extremity edema. 2+ pedal pulses. No carotid bruits.  Abdomen: no tenderness, no masses palpated. No hepatosplenomegaly. Bowel sounds positive.  Musculoskeletal: no clubbing / cyanosis. No joint deformity upper and lower extremities. Good ROM, no contractures. Normal muscle tone.  Skin: no rashes, lesions, ulcers. No induration Neurologic: CN 2-12 grossly intact. Sensation intact, DTR normal. Strength 5/5 in all 4.  Psychiatric: Normal judgment and insight. Alert and  oriented x 3. Normal mood.     Labs on Admission: I have personally reviewed following labs and imaging studies  CBC:  Recent Labs Lab 11/19/15 1224  WBC 4.1  NEUTROABS 1.8  HGB 11.5*  HCT 34.1*  MCV 89.5  PLT 260    Basic Metabolic Panel:  Recent Labs Lab 11/19/15 1224  NA 136  K 3.6  CL 102  CO2 26  GLUCOSE 119*  BUN <5*  CREATININE 1.39*  CALCIUM 9.5    GFR: Estimated Creatinine Clearance: 54.2 mL/min (by C-G formula based on SCr of 1.39 mg/dL).  Liver Function Tests:  Recent Labs Lab 11/19/15 1224  AST 196*  ALT 98*  ALKPHOS 239*  BILITOT 1.3*  PROT 8.2*  ALBUMIN 3.2*    Recent Labs Lab 11/19/15 1224  LIPASE 22   No results for input(s): AMMONIA in the last 168 hours.  Coagulation Profile: No results for input(s): INR, PROTIME in the last 168 hours. No  results for input(s): DDIMER in the last 72 hours.  Cardiac Enzymes: No results for input(s): CKTOTAL, CKMB, CKMBINDEX, TROPONINI in the last 168 hours.  BNP (last 3 results) No results for input(s): PROBNP in the last 8760 hours.  HbA1C: No results for input(s): HGBA1C in the last 72 hours. Lab Results  Component Value Date   HGBA1C 4.8 09/07/2015   HGBA1C  10/17/2009    5.0 (NOTE)                                                                       According to the ADA Clinical Practice Recommendations for 2011, when HbA1c is used as a screening test:   >=6.5%   Diagnostic of Diabetes Mellitus           (if abnormal result  is confirmed)  5.7-6.4%   Increased risk of developing Diabetes Mellitus  References:Diagnosis and Classification of Diabetes Mellitus,Diabetes Care,2011,34(Suppl 1):S62-S69 and Standards of Medical Care in         Diabetes - 2011,Diabetes Care,2011,34  (Suppl 1):S11-S61.   HGBA1C  02/21/2009    5.3 (NOTE) The ADA recommends the following therapeutic goal for glycemic control related to Hgb A1c measurement: Goal of therapy: <6.5 Hgb A1c  Reference: American  Diabetes Association: Clinical Practice Recommendations 2010, Diabetes Care, 2010, 33: (Suppl  1).     CBG: No results for input(s): GLUCAP in the last 168 hours.  Lipid Profile: No results for input(s): CHOL, HDL, LDLCALC, TRIG, CHOLHDL, LDLDIRECT in the last 72 hours.  Thyroid Function Tests: No results for input(s): TSH, T4TOTAL, FREET4, T3FREE, THYROIDAB in the last 72 hours.  Anemia Panel: No results for input(s): VITAMINB12, FOLATE, FERRITIN, TIBC, IRON, RETICCTPCT in the last 72 hours.  Urine analysis:    Component Value Date/Time   COLORURINE AMBER (A) 09/16/2015 0358   APPEARANCEUR CLOUDY (A) 09/16/2015 0358   LABSPEC 1.014 09/16/2015 0358   PHURINE 5.0 09/16/2015 0358   GLUCOSEU NEGATIVE 09/16/2015 0358   HGBUR NEGATIVE 09/16/2015 0358   BILIRUBINUR NEGATIVE 09/16/2015 0358   KETONESUR NEGATIVE 09/16/2015 0358   PROTEINUR NEGATIVE 09/16/2015 0358   UROBILINOGEN 2.0 (H) 04/28/2013 1602   NITRITE POSITIVE (A) 09/16/2015 0358   LEUKOCYTESUR MODERATE (A) 09/16/2015 0358    Sepsis Labs: @LABRCNTIP (procalcitonin:4,lacticidven:4) )No results found for this or any previous visit (from the past 240 hour(s)).       Radiological Exams on Admission: Ct Head Wo Contrast  Result Date: 11/19/2015 CLINICAL DATA:  Dizziness. EXAM: CT HEAD WITHOUT CONTRAST TECHNIQUE: Contiguous axial images were obtained from the base of the skull through the vertex without intravenous contrast. COMPARISON:  CT scan of October 06, 2015. FINDINGS: Bony calvarium appears intact. Mild diffuse cortical atrophy is noted. Mild chronic ischemic white matter disease is noted. No mass effect or midline shift is noted. Ventricular size is within normal limits. There is no evidence of mass lesion, hemorrhage or acute infarction. IMPRESSION: Mild diffuse cortical atrophy. Mild chronic ischemic white matter disease. No acute intracranial abnormality seen. Electronically Signed   By: Lupita Raider, M.D.   On:  11/19/2015 14:52   Dg Chest Portable 1 View  Result Date: 11/19/2015 CLINICAL DATA:  Pt presents as possible STEMI. States was outside when  he began feeling lightheaded/dizzy,SOB, N/V. Pt. States chest pain began following this symptoms. EXAM: PORTABLE CHEST 1 VIEW COMPARISON:  10/06/2015 FINDINGS: Cardiac silhouette is normal in size. Normal mediastinal and hilar contours. Mild reticular opacity at the left lung base. This is likely scarring, similar to more remote chest radiographs. Lungs are otherwise clear. No pleural effusion or pneumothorax. There are multiple old rib fractures on the left, also stable. IMPRESSION: No active disease. Electronically Signed   By: Amie Portland M.D.   On: 11/19/2015 12:48   Ct Head Wo Contrast  Result Date: 11/19/2015 CLINICAL DATA:  Dizziness. EXAM: CT HEAD WITHOUT CONTRAST TECHNIQUE: Contiguous axial images were obtained from the base of the skull through the vertex without intravenous contrast. COMPARISON:  CT scan of October 06, 2015. FINDINGS: Bony calvarium appears intact. Mild diffuse cortical atrophy is noted. Mild chronic ischemic white matter disease is noted. No mass effect or midline shift is noted. Ventricular size is within normal limits. There is no evidence of mass lesion, hemorrhage or acute infarction. IMPRESSION: Mild diffuse cortical atrophy. Mild chronic ischemic white matter disease. No acute intracranial abnormality seen. Electronically Signed   By: Lupita Raider, M.D.   On: 11/19/2015 14:52   Dg Chest Portable 1 View  Result Date: 11/19/2015 CLINICAL DATA:  Pt presents as possible STEMI. States was outside when he began feeling lightheaded/dizzy,SOB, N/V. Pt. States chest pain began following this symptoms. EXAM: PORTABLE CHEST 1 VIEW COMPARISON:  10/06/2015 FINDINGS: Cardiac silhouette is normal in size. Normal mediastinal and hilar contours. Mild reticular opacity at the left lung base. This is likely scarring, similar to more remote chest  radiographs. Lungs are otherwise clear. No pleural effusion or pneumothorax. There are multiple old rib fractures on the left, also stable. IMPRESSION: No active disease. Electronically Signed   By: Amie Portland M.D.   On: 11/19/2015 12:48      EKG: Independently reviewed.    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 , no previous cardiology consultation        Essential hypertension-hold all antihypertensive medications, hydrate patient with IV fluids and repeat orthostatics in the morning      Acute kidney injury (HCC)-creatinine slightly up from baseline, likely prerenal etiology, continue IV fluids  Acute Encephalopathy resolved, CT negative, patient's mental function is back to baseline  Alcohol dependence-CIWA protocol has been initiated, EtOH level  Hypomagnesemia Repleted and recheck  Hypokalemia Follow closely   Dizziness with presyncopal episode due to dehydration in conjunction with ETOH withdrawal - CT head negative - TTE notes preserved EF w/ no WMA and 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  .   Tobacco abuse  Nicotine patch    DVT prophylaxis:  Lovenox     Code Status Orders Full code          Family Communication: discussed with patient   . By the bedside   Disposition Plan:  Anticipate discharge in 2-3 days pending further workup and clinical progress  Consults called: Cardiology  Admission status: Observation  Total time spent 55 minutes.Greater than 50% of this time was spent in counseling, explanation of diagnosis, planning of further management, and coordination of care  [facility] MD [facility] Pager [phone]  If 7PM-7AM,  please contact night-coverage [email or web address] Password [facility]  11/19/2015, 4:13 PM

## 2015-11-19 NOTE — ED Provider Notes (Signed)
[facility]-EMERGENCY DEPT Provider Note   CSN: [id number] Arrival date & time: 11/19/15  1215   History   Chief Complaint Chief Complaint  Patient presents with  . Dizziness  . Shortness of Breath    HPI Austin Mendez is a 55 y.o. male.  The history is provided by the patient, the EMS personnel, a relative and medical records. The history is limited by the condition of the patient.   55 year old male with history of hypertension, tobacco abuse, EtOH abuse, history of noncompliance presenting with altered mental status, initial concern for possible STEMI.   EMS was called for acute shortness of breath, lightheadedness, and diaphoresis. Upon EMS arrival pt was diaphoretic and complaining of SOB mostly. EKG concerning for possible STEMI so patient was given 500cc IVF, aspirin, and code STEMI was activated. However, upon arrival it was determined that the EKG findings are similar to prior and c/w repol abnormalities so code STEMI was canceled.   Patient was acutely somnolent upon arrival. Arousable to verbal and tactile stimuli and complaining of feeling like he can't breathe. Denies chest pain. States that these symptoms started shortly after taking all of his medications this morning. He is not sure what medications he takes. His wife manages his medications for him. He does state that some of his medications are new within the last couple weeks. He is not sure which ones. Denies EtOH use today, was using yesterday. Denies any substance use today. Denies recent head injury. No focal weakness or numbness reported.    Past Medical History:  Diagnosis Date  . Dizziness and giddiness   . Hypertension   . Shortness of breath   . Syncope and collapse   . Unspecified essential hypertension     Patient Active Problem List   Diagnosis Date Noted  . Altered mental status   . AKI (acute kidney injury) (HCC)   . Uncontrolled hypertension   . Noncompliance with treatment   . Tobacco abuse     . Hypomagnesemia 09/07/2015  . Epigastric pain 09/07/2015  . ETOH abuse 09/07/2015  . Near syncope   . Medical non-compliance   . 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 Syncope and collapse 08/02/2012  . Facial fracture due to fall (HCC) 08/02/2012  . Hypokalemia 08/02/2012  . Acute kidney injury (HCC) 08/02/2012  . Syncope 08/02/2012  . Alcohol abuse 04/20/2006  . TOBACCO ABUSE 04/20/2006  . Essential hypertension 04/20/2006    History reviewed. No pertinent surgical history.   Home Medications    Prior to Admission medications   Medication Sig Start Date End Date Taking? Authorizing Provider  amLODipine (NORVASC) 10 MG tablet Take 1 tablet (10 mg total) by mouth daily. 09/21/15  Yes Barnetta Chapel, MD  carvedilol (COREG) 25 MG tablet Take 1 tablet (25 mg total) by mouth 2 (two) times daily with a meal. 09/21/15  Yes Barnetta Chapel, MD  cloNIDine (CATAPRES) 0.3 MG tablet Take 1 tablet (0.3 mg total) by mouth 3 (three) times daily. 09/21/15  Yes Barnetta Chapel, MD  folic acid (FOLVITE) 1 MG tablet Take 1 tablet (1 mg total) by mouth daily. 09/21/15  Yes Barnetta Chapel, MD  gabapentin (NEURONTIN) 300 MG capsule Take 300 mg by mouth 3 (three) times daily.   Yes Historical Provider, MD  hydrALAZINE (APRESOLINE) 100 MG tablet Take 1 tablet (100 mg total) by mouth every 8 (eight) hours. 09/21/15  Yes Barnetta Chapel, MD  magnesium oxide (MAG-OX) 400 MG tablet Take 1 tablet (  400 mg total) by mouth 2 (two) times daily. 09/21/15  Yes Barnetta Chapel, MD  Multiple Vitamin (MULTIVITAMIN WITH MINERALS) TABS tablet Take 1 tablet by mouth daily. 09/21/15  Yes Barnetta Chapel, MD  omeprazole (PRILOSEC) 20 MG capsule Take 20 mg by mouth daily.   Yes Historical Provider, MD  potassium chloride SA (K-DUR,KLOR-CON) 20 MEQ tablet Take 1 tablet (20 mEq total) by mouth daily. 09/07/15  Yes Nicole Pisciotta, PA-C  thiamine 100 MG tablet Take 1 tablet (100 mg total) by mouth daily. 09/21/15   Yes Barnetta Chapel, MD  feeding supplement, ENSURE ENLIVE, (ENSURE ENLIVE) LIQD Take 237 mLs by mouth 2 (two) times daily between meals. Patient not taking: Reported on 10/06/2015 09/21/15   Barnetta Chapel, MD  promethazine (PHENERGAN) 25 MG tablet Take 1 tablet (25 mg total) by mouth every 6 (six) hours as needed for nausea or vomiting. 09/07/15   Wynetta Emery, PA-C    Family History Family History  Problem Relation Age of Onset  . Cancer Mother   . Diabetes Father   . Hypertension      Social History Social History  Substance Use Topics  . Smoking status: Current Every Day Smoker    Packs/day: 10.00    Types: Cigarettes  . Smokeless tobacco: Not on file  . Alcohol use 0.0 oz/week     Comment: Smells of ETOH  St's just stopped drinking     Allergies   Review of patient's allergies indicates no known allergies.   Review of Systems Review of Systems  Constitutional: Positive for diaphoresis. Negative for fever.  Eyes: Negative for visual disturbance.  Respiratory: Positive for shortness of breath.   Cardiovascular: Negative for chest pain.  Gastrointestinal: Positive for nausea. Negative for abdominal pain and vomiting.  Skin: Negative for rash.  Allergic/Immunologic: Negative for immunocompromised state.  Neurological: Positive for light-headedness. Negative for weakness.  Hematological: Does not bruise/bleed easily.  Psychiatric/Behavioral: Positive for confusion.  All other systems reviewed and are negative.   Physical Exam Updated Vital Signs BP 117/89   Pulse 60   Temp 97.9 F (36.6 C) (Oral)   Resp 11   Ht 5\' 6"  (1.676 m)   Wt 68 kg   SpO2 100%   BMI 24.21 kg/m   Physical Exam  Constitutional: He appears well-developed.  HENT:  Head: Normocephalic and atraumatic.  Eyes: Conjunctivae are normal.  Pinpoint pupils bilat   Neck: Neck supple.  Cardiovascular: Regular rhythm and intact distal pulses.  Bradycardia present.   No murmur  heard. Pulmonary/Chest: Effort normal and breath sounds normal. No respiratory distress. He exhibits no tenderness.  Abdominal: Soft. There is no tenderness.  Musculoskeletal: He exhibits no edema.  Neurological:  Somnolent, but arouses to verbal and tactile stimuli. No CN deficits. Moves all extremities. Has trouble holding limbs up, symmetrically, but grip and ankle PF/DF intact and 5/5 bilat. Sensation intact.   Skin: Skin is warm and dry.  Psychiatric: He has a normal mood and affect.  Nursing note and vitals reviewed.   ED Treatments / Results  Labs (all labs ordered are listed, but only abnormal results are displayed) Labs Reviewed  CBC WITH DIFFERENTIAL/PLATELET - Abnormal; Notable for the following:       Result Value   RBC 3.81 (*)    Hemoglobin 11.5 (*)    HCT 34.1 (*)    All other components within normal limits  COMPREHENSIVE METABOLIC PANEL - Abnormal; Notable for the following:  Glucose, Bld 119 (*)    BUN <5 (*)    Creatinine, Ser 1.39 (*)    Total Protein 8.2 (*)    Albumin 3.2 (*)    AST 196 (*)    ALT 98 (*)    Alkaline Phosphatase 239 (*)    Total Bilirubin 1.3 (*)    GFR calc non Af Amer 56 (*)    All other components within normal limits  LIPASE, BLOOD  ETHANOL  URINE RAPID DRUG SCREEN, HOSP PERFORMED  I-STAT TROPOININ, ED  I-STAT TROPOININ, ED    EKG  EKG Interpretation  Date/Time:  Monday November 19 2015 12:22:43 EDT Ventricular Rate:  68 PR Interval:    QRS Duration: 101 QT Interval:  478 QTC Calculation: 509 R Axis:   74 Text Interpretation:  Sinus rhythm ST elevation suggests acute pericarditis Prolonged QT interval No significant change since last tracing Confirmed by Anitra Lauth  MD, Alphonzo Lemmings ([id number]) on 11/19/2015 1:33:29 PM       Radiology Ct Head Wo Contrast  Result Date: 11/19/2015 CLINICAL DATA:  Dizziness. EXAM: CT HEAD WITHOUT CONTRAST TECHNIQUE: Contiguous axial images were obtained from the base of the skull through the vertex  without intravenous contrast. COMPARISON:  CT scan of October 06, 2015. FINDINGS: Bony calvarium appears intact. Mild diffuse cortical atrophy is noted. Mild chronic ischemic white matter disease is noted. No mass effect or midline shift is noted. Ventricular size is within normal limits. There is no evidence of mass lesion, hemorrhage or acute infarction. IMPRESSION: Mild diffuse cortical atrophy. Mild chronic ischemic white matter disease. No acute intracranial abnormality seen. Electronically Signed   By: Lupita Raider, M.D.   On: 11/19/2015 14:52   Dg Chest Portable 1 View  Result Date: 11/19/2015 CLINICAL DATA:  Pt presents as possible STEMI. States was outside when he began feeling lightheaded/dizzy,SOB, N/V. Pt. States chest pain began following this symptoms. EXAM: PORTABLE CHEST 1 VIEW COMPARISON:  10/06/2015 FINDINGS: Cardiac silhouette is normal in size. Normal mediastinal and hilar contours. Mild reticular opacity at the left lung base. This is likely scarring, similar to more remote chest radiographs. Lungs are otherwise clear. No pleural effusion or pneumothorax. There are multiple old rib fractures on the left, also stable. IMPRESSION: No active disease. Electronically Signed   By: Amie Portland M.D.   On: 11/19/2015 12:48    Procedures Procedures (including critical care time)  Medications Ordered in ED Medications  naloxone [facility]) injection 0.4 mg (0.4 mg Intravenous Given 11/19/15 1340)  naloxone (NARCAN) injection 0.4 mg (0.4 mg Intravenous Given 11/19/15 1224)  sodium chloride 0.9 % bolus 1,000 mL (1,000 mLs Intravenous New Bag/Given 11/19/15 1506)    Initial Impression / Assessment and Plan / ED Course  I have reviewed the triage vital signs and the nursing notes.  Pertinent labs & imaging results that were available during my care of the patient were reviewed by me and considered in my medical decision making (see chart for details).  Clinical Course    55 year old male  with history of hypertension, tobacco abuse, EtOH abuse, history of noncompliance presenting with altered mental status as above. AF, hypotensive with SBP 90s though MAPs >65, bradycardic with HRs ranging 50-60s.    Initial concern for possible opiate intoxication and pinpoint pupils. Patient responded somewhat to Narcan. Mental status gradually improved. He states he is feeling better but still lightheaded.  History of head trauma. Altered mental status, CT head obtained and is negative for acute intracranial abnormality. Noted  to have orthostatic lightheadedness and corresponding heart rate changes. Somewhat soft blood pressures, responded to IV fluids. Patient adamantly denies any drug use and urine drug screen is negative. Admits to EtOH use yesterday, is undetectable here today. Likely EtOH use contributing to mild elevation in LFTs.   Suspect polypharmacy is contributing to presentation. Onset was just after taking his morning medications and no other consistent explanation for symptoms. Patient remains lightheaded with standing. Family does not feel pt stable enough for discharge at this point. Will admit for medication management and further observation. Per discussion with Hospitalist, will consult Cardiology as well for possible HOCM like pathology.   Case discussed with Dr. Anitra LauthPlunkett, who oversaw management of this patient.   Final Clinical Impressions(s) / ED Diagnoses   Final diagnoses:  Dizziness  Shortness of breath  Somnolence  Orthostatic dizziness    New Prescriptions New Prescriptions   No medications on file     Urban GibsonJenny Kazimierz Springborn, MD 11/19/15 1535    Gwyneth SproutWhitney Plunkett, MD 11/19/15 2050

## 2015-11-19 NOTE — ED Notes (Signed)
Cancelling Code Stemi per Dr. Anitra LauthPlunkett

## 2015-11-19 NOTE — ED Notes (Signed)
Admitting MD at bedside.

## 2015-11-19 NOTE — ED Notes (Addendum)
Pt. Perceived to be increasingly drowsy. Per MD will administer PRN dose of narcan.

## 2015-11-19 NOTE — ED Notes (Signed)
Pt. Reports dizziness with standing. MD notified.

## 2015-11-19 NOTE — ED Notes (Signed)
Pt going to ct  

## 2015-11-19 NOTE — Consult Note (Signed)
Cardiology Consult    Patient ID: Austin Mendez MRN: [id number], DOB/AGE: 09/25/1960   Admit date: 11/19/2015 Date of Consult: 11/19/2015  Primary Physician: Billee Cashing, MD Reason for Consult: syncope/HOCM Primary Cardiologist: New  Requesting Provider: Dr. Susie Cassette  Patient Profile  Austin Mendez is a 55 year old male with a past medical history of HTN, tobacco abuse and ETOH abuse and syncope in the setting of ETOH abuse.   History of Present Illness  Austin Mendez presented to the ED today via EMS. He woke up this morning and took his daily doses of his amlodipine, Coreg, hydralazine and clonidine. He says then he is unsure of what happened next. His wife called EMS because he was unresponsive.   Upon EMS arrival he was SOB and diaphoretic. He also had some altered mental status, and was given Narcan. His mental status has gradually improved.   Code STEMI was initially activated for diffuse ST elevation, however it was determined that his EKG was unchanged from prior. He has early repolarization abnormalities. Cardiology was consulted for evaluation of possible HOCM as his Echo in June 2017 showed dynamic obstruction in the mid cavity with a peak velocity of 178 cm/sec and peak gradient of 13 mm Hg.   He was orthostatic with a 20 point drop in his systolic BP today. He is likely dehydrated as he is a every day drinker, he tells me he drinks one 16 ounce beer a day. He denies ever having any syncopal episodes before today but has a recent ED visit in July for syncope. He denies chest pain and SOB.   Past Medical History   Past Medical History:  Diagnosis Date  . Dizziness and giddiness   . Hypertension   . Shortness of breath   . Syncope and collapse   . Unspecified essential hypertension     History reviewed. No pertinent surgical history.   Allergies  No Known Allergies  Inpatient Medications    . enoxaparin (LOVENOX) injection  40 mg Subcutaneous Q24H  .  folic acid  1 mg Oral Daily  . multivitamin with minerals  1 tablet Oral Daily  . sodium chloride flush  3 mL Intravenous Q12H  . thiamine  100 mg Oral Daily   Or  . thiamine  100 mg Intravenous Daily    Family History    Family History  Problem Relation Age of Onset  . Cancer Mother   . Diabetes Father   . Hypertension      Social History    Social History   Social History  . Marital status: Married    Spouse name: N/A  . Number of children: N/A  . Years of education: N/A   Occupational History  . Not on file.   Social History Main Topics  . Smoking status: Current Every Day Smoker    Packs/day: 10.00    Types: Cigarettes  . Smokeless tobacco: Not on file  . Alcohol use 0.0 oz/week     Comment: Smells of ETOH  St's just stopped drinking  . Drug use:     Types: Marijuana  . Sexual activity: Not on file     Comment: St's stopped 2 yrs ago   Other Topics Concern  . Not on file   Social History Narrative  . No narrative on file     Review of Systems    General:  No chills, fever, night sweats or weight changes.  Cardiovascular:  No chest pain, dyspnea  on exertion, edema, orthopnea, palpitations, paroxysmal nocturnal dyspnea. Dermatological: No rash, lesions/masses Respiratory: No cough, dyspnea Urologic: No hematuria, dysuria Abdominal:   No nausea, vomiting, diarrhea, bright red blood per rectum, melena, or hematemesis Neurologic:  No visual changes, wkns, changes in mental status. All other systems reviewed and are otherwise negative except as noted above.  Physical Exam    Blood pressure 144/99, pulse 67, temperature 97.9 F (36.6 C), temperature source Oral, resp. rate 14, height 5\' 6"  (1.676 m), weight 150 lb (68 kg), SpO2 98 %.  General: Pleasant, NAD Psych: Normal affect. Neuro: Alert and oriented, speech is garbled. Moves all extremities spontaneously. HEENT: Normocephalic, poor dentition.   Neck: Supple without bruits.  Lungs:  Resp regular  and unlabored, CTA. Heart: RRR no s3, s4, or murmurs. Abdomen: Soft, non-tender, non-distended, BS + x 4.  Extremities: No clubbing, cyanosis or edema. DP/PT/Radials 2+ and equal bilaterally.  Labs    Troponin [facility] Test)  Recent Labs  11/19/15 1228  TROPIPOC 0.01    Lab Results  Component Value Date   WBC 4.1 11/19/2015   HGB 11.5 (L) 11/19/2015   HCT 34.1 (L) 11/19/2015   MCV 89.5 11/19/2015   PLT 260 11/19/2015    Recent Labs Lab 11/19/15 1224  NA 136  K 3.6  CL 102  CO2 26  BUN <5*  CREATININE 1.39*  CALCIUM 9.5  PROT 8.2*  BILITOT 1.3*  ALKPHOS 239*  ALT 98*  AST 196*  GLUCOSE 119*    Radiology Studies    Ct Head Wo Contrast  Result Date: 11/19/2015 CLINICAL DATA:  Dizziness. EXAM: CT HEAD WITHOUT CONTRAST TECHNIQUE: Contiguous axial images were obtained from the base of the skull through the vertex without intravenous contrast. COMPARISON:  CT scan of October 06, 2015. FINDINGS: Bony calvarium appears intact. Mild diffuse cortical atrophy is noted. Mild chronic ischemic white matter disease is noted. No mass effect or midline shift is noted. Ventricular size is within normal limits. There is no evidence of mass lesion, hemorrhage or acute infarction. IMPRESSION: Mild diffuse cortical atrophy. Mild chronic ischemic white matter disease. No acute intracranial abnormality seen. Electronically Signed   By: Lupita Raider, M.D.   On: 11/19/2015 14:52   Dg Chest Portable 1 View  Result Date: 11/19/2015 CLINICAL DATA:  Pt presents as possible STEMI. States was outside when he began feeling lightheaded/dizzy,SOB, N/V. Pt. States chest pain began following this symptoms. EXAM: PORTABLE CHEST 1 VIEW COMPARISON:  10/06/2015 FINDINGS: Cardiac silhouette is normal in size. Normal mediastinal and hilar contours. Mild reticular opacity at the left lung base. This is likely scarring, similar to more remote chest radiographs. Lungs are otherwise clear. No pleural effusion  or pneumothorax. There are multiple old rib fractures on the left, also stable. IMPRESSION: No active disease. Electronically Signed   By: Amie Portland M.D.   On: 11/19/2015 12:48    EKG & Cardiac Imaging    EKG:  NSR with diffuse ST abnormalities consistent with early repolarization  Echocardiogram: 09/11/15 Study Conclusions  - Left ventricle: The cavity size was normal. There was moderate   concentric hypertrophy. Systolic function was normal. The   estimated ejection fraction was in the range of 60% to 65%. There   was dynamic obstruction in the mid cavity, with a peak velocity   of 178 cm/sec and a peak gradient of 13 mm Hg. Wall motion was   normal; there were no regional wall motion abnormalities. Doppler  parameters are consistent with abnormal left ventricular   relaxation (grade 1 diastolic dysfunction). Doppler parameters   are consistent with indeterminate ventricular filling pressure. - Aortic valve: Transvalvular velocity was within the normal range.   There was no stenosis. There was no regurgitation. - Mitral valve: There was trivial regurgitation. - Left atrium: The atrium was mildly dilated. - Right ventricle: The cavity size was normal. Wall thickness was   normal. Systolic function was normal. - Atrial septum: No defect or patent foramen ovale was identified   by color flow Doppler. - Tricuspid valve: There was trivial regurgitation.   Assessment & Plan  1. Evaluation for HOCM: Presents with syncope in the setting of orthostatic hypotension. Has evidence of obstruction in LV by Echo in June. He has a slight murmur on exam, heard at the apex that is not worsened with valsalva maneuver. No prominent a waves on exam.   A peak gradient of 13 mm Hg is considered trivial. No evidence of thickened septal wall or systolic anterior motion of the mitral valve.   Would reduce his blood pressure medications, would start by reducing hydralazine with 50mg  TID. Further  recommendations to follow.     Signed, Little IshikawaErin E Smith, NP 11/19/2015, 3:57 PM Pager: [phone]  Patient seen, examined. Available data reviewed. Agree with findings, assessment, and plan as outlined by Suzzette RighterErin Smith, NP. Independently evaluated. Pt is a poor historian, but he is in NAD. HEENT remarkable for poor dentition. Nech: JVP NL, no bruits. Lungs CTA, heart RRR no S4 or murmur, abdomen soft, NT, extremities without edema. EKG NSR with benign early repolarization pattern. Echo showed normal LV systolic function with LVH and mid-cavitary obliteration but no LVOT obstruction, SAM, or asymmetric hypertrophy (no finding of hypertrophic cardiomyopathy).   He admits to drinking alcohol but states he's 'cut back.' He's had recurrent syncope with a benign workup in the past. No arrhythmia on tele or EKG. Agree with hydration and holding antihypertensives. However, he's on large doses of multiple antihypertensives and will be at risk of rebound, especially related to clonidine 0.3 mg TID. Orthostatics to be redone in the morning.   No further cardiac evaluation is indicated. He needs to stop drinking alcohol and was counseled. Will defer medication adjustment to the primary team but happy to help if needed.  Tonny BollmanMichael Kanitra Purifoy, M.D. 11/19/2015 6:00 PM

## 2015-11-19 NOTE — ED Notes (Signed)
Pt given narcan and pt drowsiness did not improve.

## 2015-11-20 DIAGNOSIS — R55 Syncope and collapse: Secondary | ICD-10-CM | POA: Diagnosis not present

## 2015-11-20 DIAGNOSIS — R400 Somnolence: Secondary | ICD-10-CM | POA: Diagnosis not present

## 2015-11-20 DIAGNOSIS — I10 Essential (primary) hypertension: Secondary | ICD-10-CM | POA: Diagnosis not present

## 2015-11-20 LAB — CBC
HCT: 30.9 % — ABNORMAL LOW (ref 39.0–52.0)
Hemoglobin: 10.2 g/dL — ABNORMAL LOW (ref 13.0–17.0)
MCH: 30 pg (ref 26.0–34.0)
MCHC: 33 g/dL (ref 30.0–36.0)
MCV: 90.9 fL (ref 78.0–100.0)
Platelets: 215 10*3/uL (ref 150–400)
RBC: 3.4 MIL/uL — ABNORMAL LOW (ref 4.22–5.81)
RDW: 15.2 % (ref 11.5–15.5)
WBC: 4.8 10*3/uL (ref 4.0–10.5)

## 2015-11-20 LAB — COMPREHENSIVE METABOLIC PANEL
ALT: 75 U/L — ABNORMAL HIGH (ref 17–63)
AST: 129 U/L — ABNORMAL HIGH (ref 15–41)
Albumin: 2.5 g/dL — ABNORMAL LOW (ref 3.5–5.0)
Alkaline Phosphatase: 192 U/L — ABNORMAL HIGH (ref 38–126)
Anion gap: 6 (ref 5–15)
BUN: 10 mg/dL (ref 6–20)
CO2: 27 mmol/L (ref 22–32)
Calcium: 9 mg/dL (ref 8.9–10.3)
Chloride: 106 mmol/L (ref 101–111)
Creatinine, Ser: 1.25 mg/dL — ABNORMAL HIGH (ref 0.61–1.24)
GFR calc Af Amer: >60 mL/min (ref >60)
GFR calc non Af Amer: >60 mL/min (ref >60)
Glucose, Bld: 106 mg/dL — ABNORMAL HIGH (ref 65–99)
Potassium: 3.4 mmol/L — ABNORMAL LOW (ref 3.5–5.1)
Sodium: 139 mmol/L (ref 135–145)
Total Bilirubin: 1 mg/dL (ref 0.3–1.2)
Total Protein: 6.5 g/dL (ref 6.5–8.1)

## 2015-11-20 LAB — TROPONIN I: Troponin I: <0.03 ng/mL (ref <0.03)

## 2015-11-20 MED ORDER — THIAMINE HCL 100 MG PO TABS: 100.0000 mg | ORAL_TABLET | Freq: Every day | ORAL | 1 refills | Status: DC

## 2015-11-20 MED ORDER — CARVEDILOL 12.5 MG PO TABS: 12.5000 mg | ORAL_TABLET | Freq: Two times a day (BID) | ORAL | 1 refills | Status: DC

## 2015-11-20 MED ORDER — CARVEDILOL 12.5 MG PO TABS
12.5000 mg | ORAL_TABLET | Freq: Two times a day (BID) | ORAL | Status: DC
  Administered 2015-11-20: 12.5 mg via ORAL
  Filled 2015-11-20: qty 1

## 2015-11-20 MED ORDER — HYDRALAZINE HCL 25 MG PO TABS
25.0000 mg | ORAL_TABLET | Freq: Three times a day (TID) | ORAL | Status: DC
  Administered 2015-11-20 (×2): 25 mg via ORAL
  Filled 2015-11-20 (×2): qty 1

## 2015-11-20 MED ORDER — POTASSIUM CHLORIDE CRYS ER 20 MEQ PO TBCR
40.0000 meq | EXTENDED_RELEASE_TABLET | Freq: Two times a day (BID) | ORAL | Status: DC
  Administered 2015-11-20: 40 meq via ORAL
  Filled 2015-11-20: qty 2

## 2015-11-20 MED ORDER — CLONIDINE HCL 0.1 MG PO TABS
0.1000 mg | ORAL_TABLET | Freq: Two times a day (BID) | ORAL | Status: DC
  Administered 2015-11-20: 0.1 mg via ORAL
  Filled 2015-11-20: qty 1

## 2015-11-20 MED ORDER — CLONIDINE HCL 0.1 MG PO TABS: 0.1000 mg | ORAL_TABLET | Freq: Two times a day (BID) | ORAL | 11 refills | Status: DC

## 2015-11-20 MED ORDER — HYDRALAZINE HCL 25 MG PO TABS: 25.0000 mg | ORAL_TABLET | Freq: Three times a day (TID) | ORAL | 1 refills | Status: DC

## 2015-11-20 NOTE — Discharge Summary (Signed)
Physician Discharge Summary  Austin Mendez MRN: [id number] DOB/AGE: 55-Oct-1962 55 y.o.  PCP: Ricke Hey, MD   Admit date: 11/19/2015 Discharge date: 11/20/2015  Discharge Diagnoses:    Principal Problem:   Syncope Active Problems:   Alcohol abuse   Essential hypertension   Malignant hypertension   Syncope and collapse   Acute kidney injury ([place])   Altered mental status    Follow-up recommendations Follow-up with PCP in 3-5 days , including all  additional recommended appointments as below Follow-up CBC, CMP in 3-5 days       Current Discharge Medication List    START taking these medications   Details  !! thiamine 100 MG tablet Take 1 tablet (100 mg total) by mouth daily. Qty: 30 tablet, Refills: 1     !! - Potential duplicate medications found. Please discuss with provider.    CONTINUE these medications which have CHANGED   Details  carvedilol (COREG) 12.5 MG tablet Take 1 tablet (12.5 mg total) by mouth 2 (two) times daily with a meal. Qty: 60 tablet, Refills: 1    cloNIDine (CATAPRES) 0.1 MG tablet Take 1 tablet (0.1 mg total) by mouth 2 (two) times daily. Qty: 60 tablet, Refills: 11    hydrALAZINE (APRESOLINE) 25 MG tablet Take 1 tablet (25 mg total) by mouth every 8 (eight) hours. Qty: 90 tablet, Refills: 1      CONTINUE these medications which have NOT CHANGED   Details  folic acid (FOLVITE) 1 MG tablet Take 1 tablet (1 mg total) by mouth daily. Qty: 30 tablet, Refills: 1    gabapentin (NEURONTIN) 300 MG capsule Take 300 mg by mouth 3 (three) times daily.    magnesium oxide (MAG-OX) 400 MG tablet Take 1 tablet (400 mg total) by mouth 2 (two) times daily. Qty: 6 tablet, Refills: 0    Multiple Vitamin (MULTIVITAMIN WITH MINERALS) TABS tablet Take 1 tablet by mouth daily. Qty: 30 tablet, Refills: 1    omeprazole (PRILOSEC) 20 MG capsule Take 20 mg by mouth daily.    potassium chloride SA (K-DUR,KLOR-CON) 20 MEQ tablet Take 1 tablet (20  mEq total) by mouth daily. Qty: 3 tablet, Refills: 0    !! thiamine 100 MG tablet Take 1 tablet (100 mg total) by mouth daily. Qty: 30 tablet, Refills: 1    feeding supplement, ENSURE ENLIVE, (ENSURE ENLIVE) LIQD Take 237 mLs by mouth 2 (two) times daily between meals. Qty: 14220 mL, Refills: 1    promethazine (PHENERGAN) 25 MG tablet Take 1 tablet (25 mg total) by mouth every 6 (six) hours as needed for nausea or vomiting. Qty: 12 tablet, Refills: 0     !! - Potential duplicate medications found. Please discuss with provider.    STOP taking these medications     amLODipine (NORVASC) 10 MG tablet          Discharge Condition: *Overall prognosis is guarded if the patient continues to drink   Discharge Instructions Get Medicines reviewed and adjusted: Please take all your medications with you for your next visit with your Primary MD  Please request your Primary MD to go over all hospital tests and procedure/radiological results at the follow up, please ask your Primary MD to get all Hospital records sent to his/her office.  If you experience worsening of your admission symptoms, develop shortness of breath, life threatening emergency, suicidal or homicidal thoughts you must seek medical attention immediately by calling 911 or calling your MD immediately if symptoms less severe.  You must read complete instructions/literature along with all the possible adverse reactions/side effects for all the Medicines you take and that have been prescribed to you. Take any new Medicines after you have completely understood and accpet all the possible adverse reactions/side effects.   Do not drive when taking Pain medications.   Do not take more than prescribed Pain, Sleep and Anxiety Medications  Special Instructions: If you have smoked or chewed Tobacco in the last 2 yrs please stop smoking, stop any regular Alcohol and or any Recreational drug use.  Wear Seat belts while  driving.  Please note  You were cared for by a hospitalist during your hospital stay. Once you are discharged, your primary care physician will handle any further medical issues. Please note that NO REFILLS for any discharge medications will be authorized once you are discharged, as it is imperative that you return to your primary care physician (or establish a relationship with a primary care physician if you do not have one) for your aftercare needs so that they can reassess your need for medications and monitor your lab values.     No Known Allergies    Disposition: 01-Home or Self Care   Consults: Cardiology    Significant Diagnostic Studies:  Ct Head Wo Contrast  Result Date: 11/19/2015 CLINICAL DATA:  Dizziness. EXAM: CT HEAD WITHOUT CONTRAST TECHNIQUE: Contiguous axial images were obtained from the base of the skull through the vertex without intravenous contrast. COMPARISON:  CT scan of October 06, 2015. FINDINGS: Bony calvarium appears intact. Mild diffuse cortical atrophy is noted. Mild chronic ischemic white matter disease is noted. No mass effect or midline shift is noted. Ventricular size is within normal limits. There is no evidence of mass lesion, hemorrhage or acute infarction. IMPRESSION: Mild diffuse cortical atrophy. Mild chronic ischemic white matter disease. No acute intracranial abnormality seen. Electronically Signed   By: Marijo Conception, M.D.   On: 11/19/2015 14:52   Dg Chest Portable 1 View  Result Date: 11/19/2015 CLINICAL DATA:  Pt presents as possible STEMI. States was outside when he began feeling lightheaded/dizzy,SOB, N/V. Pt. States chest pain began following this symptoms. EXAM: PORTABLE CHEST 1 VIEW COMPARISON:  10/06/2015 FINDINGS: Cardiac silhouette is normal in size. Normal mediastinal and hilar contours. Mild reticular opacity at the left lung base. This is likely scarring, similar to more remote chest radiographs. Lungs are otherwise clear. No pleural  effusion or pneumothorax. There are multiple old rib fractures on the left, also stable. IMPRESSION: No active disease. Electronically Signed   By: Lajean Manes M.D.   On: 11/19/2015 12:48       Filed Weights   11/19/15 1232 11/19/15 1700 11/19/15 2010  Weight: 68 kg (150 lb) 68 kg (150 lb) 68.4 kg (150 lb 12.7 oz)     Microbiology: No results found for this or any previous visit (from the past 240 hour(s)).     Blood Culture    Component Value Date/Time   SDES URINE, CLEAN CATCH 09/17/2015 1547   SPECREQUEST NONE 09/17/2015 1547   CULT >=100,000 COLONIES/mL ESCHERICHIA COLI (A) 09/17/2015 1547   REPTSTATUS 09/19/2015 FINAL 09/17/2015 1547      Labs: Results for orders placed or performed during the hospital encounter of 11/19/15 (from the past 48 hour(s))  CBC with Differential     Status: Abnormal   Collection Time: 11/19/15 12:24 PM  Result Value Ref Range   WBC 4.1 4.0 - 10.5 K/uL   RBC 3.81 (L) 4.22 -  5.81 MIL/uL   Hemoglobin 11.5 (L) 13.0 - 17.0 g/dL   HCT 34.1 (L) 39.0 - 52.0 %   MCV 89.5 78.0 - 100.0 fL   MCH 30.2 26.0 - 34.0 pg   MCHC 33.7 30.0 - 36.0 g/dL   RDW 15.2 11.5 - 15.5 %   Platelets 260 150 - 400 K/uL   Neutrophils Relative % 46 %   Neutro Abs 1.8 1.7 - 7.7 K/uL   Lymphocytes Relative 41 %   Lymphs Abs 1.7 0.7 - 4.0 K/uL   Monocytes Relative 7 %   Monocytes Absolute 0.3 0.1 - 1.0 K/uL   Eosinophils Relative 5 %   Eosinophils Absolute 0.2 0.0 - 0.7 K/uL   Basophils Relative 1 %   Basophils Absolute 0.0 0.0 - 0.1 K/uL  Comprehensive metabolic panel     Status: Abnormal   Collection Time: 11/19/15 12:24 PM  Result Value Ref Range   Sodium 136 135 - 145 mmol/L   Potassium 3.6 3.5 - 5.1 mmol/L   Chloride 102 101 - 111 mmol/L   CO2 26 22 - 32 mmol/L   Glucose, Bld 119 (H) 65 - 99 mg/dL   BUN <5 (L) 6 - 20 mg/dL   Creatinine, Ser 1.39 (H) 0.61 - 1.24 mg/dL   Calcium 9.5 8.9 - 10.3 mg/dL   Total Protein 8.2 (H) 6.5 - 8.1 g/dL   Albumin 3.2  (L) 3.5 - 5.0 g/dL   AST 196 (H) 15 - 41 U/L   ALT 98 (H) 17 - 63 U/L   Alkaline Phosphatase 239 (H) 38 - 126 U/L   Total Bilirubin 1.3 (H) 0.3 - 1.2 mg/dL   GFR calc non Af Amer 56 (L) >60 mL/min   GFR calc Af Amer >60 >60 mL/min    Comment: (NOTE) The eGFR has been calculated using the CKD EPI equation. This calculation has not been validated in all clinical situations. eGFR's persistently <60 mL/min signify possible Chronic Kidney Disease.    Anion gap 8 5 - 15  Lipase, blood     Status: None   Collection Time: 11/19/15 12:24 PM  Result Value Ref Range   Lipase 22 11 - 51 U/L  Ethanol     Status: None   Collection Time: 11/19/15 12:24 PM  Result Value Ref Range   Alcohol, Ethyl (B) <5 <5 mg/dL    Comment:        LOWEST DETECTABLE LIMIT FOR SERUM ALCOHOL IS 5 mg/dL FOR MEDICAL PURPOSES ONLY   I-Stat Troponin, ED - 0, 3, 6 hours (not at [facility])     Status: None   Collection Time: 11/19/15 12:28 PM  Result Value Ref Range   Troponin i, poc 0.01 0.00 - 0.08 ng/mL   Comment 3            Comment: Due to the release kinetics of cTnI, a negative result within the first hours of the onset of symptoms does not rule out myocardial infarction with certainty. If myocardial infarction is still suspected, repeat the test at appropriate intervals.   Rapid urine drug screen (hospital performed)     Status: None   Collection Time: 11/19/15 12:41 PM  Result Value Ref Range   Opiates NONE DETECTED NONE DETECTED   Cocaine NONE DETECTED NONE DETECTED   Benzodiazepines NONE DETECTED NONE DETECTED   Amphetamines NONE DETECTED NONE DETECTED   Tetrahydrocannabinol NONE DETECTED NONE DETECTED   Barbiturates NONE DETECTED NONE DETECTED    Comment:  DRUG SCREEN FOR MEDICAL PURPOSES ONLY.  IF CONFIRMATION IS NEEDED FOR ANY PURPOSE, NOTIFY LAB WITHIN 5 DAYS.        LOWEST DETECTABLE LIMITS FOR URINE DRUG SCREEN Drug Class       Cutoff (ng/mL) Amphetamine      1000 Barbiturate       200 Benzodiazepine   200 Tricyclics       300 Opiates          300 Cocaine          300 THC              50   CBC     Status: Abnormal   Collection Time: 11/19/15  3:49 PM  Result Value Ref Range   WBC 4.1 4.0 - 10.5 K/uL   RBC 3.87 (L) 4.22 - 5.81 MIL/uL   Hemoglobin 11.8 (L) 13.0 - 17.0 g/dL   HCT 57.9 (L) 00.9 - 20.0 %   MCV 90.4 78.0 - 100.0 fL   MCH 30.5 26.0 - 34.0 pg   MCHC 33.7 30.0 - 36.0 g/dL   RDW 41.5 93.0 - 12.3 %   Platelets 254 150 - 400 K/uL  Creatinine, serum     Status: Abnormal   Collection Time: 11/19/15  3:49 PM  Result Value Ref Range   Creatinine, Ser 1.41 (H) 0.61 - 1.24 mg/dL   GFR calc non Af Amer 55 (L) >60 mL/min   GFR calc Af Amer >60 >60 mL/min    Comment: (NOTE) The eGFR has been calculated using the CKD EPI equation. This calculation has not been validated in all clinical situations. eGFR's persistently <60 mL/min signify possible Chronic Kidney Disease.   Magnesium     Status: None   Collection Time: 11/19/15  3:49 PM  Result Value Ref Range   Magnesium 1.7 1.7 - 2.4 mg/dL  TSH     Status: None   Collection Time: 11/19/15  3:49 PM  Result Value Ref Range   TSH 2.295 0.350 - 4.500 uIU/mL  Troponin I     Status: None   Collection Time: 11/19/15  3:49 PM  Result Value Ref Range   Troponin I <0.03 <0.03 ng/mL  I-Stat Troponin, ED - 0, 3, 6 hours (not at [facility])     Status: None   Collection Time: 11/19/15  4:23 PM  Result Value Ref Range   Troponin i, poc 0.01 0.00 - 0.08 ng/mL   Comment 3            Comment: Due to the release kinetics of cTnI, a negative result within the first hours of the onset of symptoms does not rule out myocardial infarction with certainty. If myocardial infarction is still suspected, repeat the test at appropriate intervals.   Ethanol     Status: None   Collection Time: 11/19/15  5:46 PM  Result Value Ref Range   Alcohol, Ethyl (B) <5 <5 mg/dL    Comment:        LOWEST DETECTABLE LIMIT FOR SERUM ALCOHOL  IS 5 mg/dL FOR MEDICAL PURPOSES ONLY   Troponin I     Status: None   Collection Time: 11/19/15 10:11 PM  Result Value Ref Range   Troponin I <0.03 <0.03 ng/mL  Troponin I     Status: None   Collection Time: 11/20/15  4:32 AM  Result Value Ref Range   Troponin I <0.03 <0.03 ng/mL  Comprehensive metabolic panel     Status: Abnormal   Collection  Time: 11/20/15  4:32 AM  Result Value Ref Range   Sodium 139 135 - 145 mmol/L   Potassium 3.4 (L) 3.5 - 5.1 mmol/L   Chloride 106 101 - 111 mmol/L   CO2 27 22 - 32 mmol/L   Glucose, Bld 106 (H) 65 - 99 mg/dL   BUN 10 6 - 20 mg/dL   Creatinine, Ser 1.25 (H) 0.61 - 1.24 mg/dL   Calcium 9.0 8.9 - 10.3 mg/dL   Total Protein 6.5 6.5 - 8.1 g/dL   Albumin 2.5 (L) 3.5 - 5.0 g/dL   AST 129 (H) 15 - 41 U/L   ALT 75 (H) 17 - 63 U/L   Alkaline Phosphatase 192 (H) 38 - 126 U/L   Total Bilirubin 1.0 0.3 - 1.2 mg/dL   GFR calc non Af Amer >60 >60 mL/min   GFR calc Af Amer >60 >60 mL/min    Comment: (NOTE) The eGFR has been calculated using the CKD EPI equation. This calculation has not been validated in all clinical situations. eGFR's persistently <60 mL/min signify possible Chronic Kidney Disease.    Anion gap 6 5 - 15  CBC     Status: Abnormal   Collection Time: 11/20/15  4:32 AM  Result Value Ref Range   WBC 4.8 4.0 - 10.5 K/uL   RBC 3.40 (L) 4.22 - 5.81 MIL/uL   Hemoglobin 10.2 (L) 13.0 - 17.0 g/dL   HCT 30.9 (L) 39.0 - 52.0 %   MCV 90.9 78.0 - 100.0 fL   MCH 30.0 26.0 - 34.0 pg   MCHC 33.0 30.0 - 36.0 g/dL   RDW 15.2 11.5 - 15.5 %   Platelets 215 150 - 400 K/uL     Lipid Panel     Component Value Date/Time   CHOL  10/18/2009 0720    127        ATP III CLASSIFICATION:  <200     mg/dL   Desirable  200-239  mg/dL   Borderline High  >=240    mg/dL   High          TRIG 126 10/18/2009 0720   HDL 39 (L) 10/18/2009 0720   CHOLHDL 3.3 10/18/2009 0720   VLDL 25 10/18/2009 0720   LDLCALC  10/18/2009 0720    63        Total  Cholesterol/HDL:CHD Risk Coronary Heart Disease Risk Table                     Men   Women  1/2 Average Risk   3.4   3.3  Average Risk       5.0   4.4  2 X Average Risk   9.6   7.1  3 X Average Risk  23.4   11.0        Use the calculated Patient Ratio above and the CHD Risk Table to determine the patient's CHD Risk.        ATP III CLASSIFICATION (LDL):  <100     mg/dL   Optimal  100-129  mg/dL   Near or Above                    Optimal  130-159  mg/dL   Borderline  160-189  mg/dL   High  >190     mg/dL   Very High     Lab Results  Component Value Date   HGBA1C 4.8 09/07/2015   HGBA1C  10/17/2009  5.0 (NOTE)                                                                       According to the ADA Clinical Practice Recommendations for 2011, when HbA1c is used as a screening test:   >=6.5%   Diagnostic of Diabetes Mellitus           (if abnormal result  is confirmed)  5.7-6.4%   Increased risk of developing Diabetes Mellitus  References:Diagnosis and Classification of Diabetes Mellitus,Diabetes LNLG,9211,94(RDEYC 1):S62-S69 and Standards of Medical Care in         Diabetes - 2011,Diabetes XKGY,1856,31  (Suppl 1):S11-S61.   HGBA1C  02/21/2009    5.3 (NOTE) The ADA recommends the following therapeutic goal for glycemic control related to Hgb A1c measurement: Goal of therapy: <6.5 Hgb A1c  Reference: American Diabetes Association: Clinical Practice Recommendations 2010, Diabetes Care, 2010, 33: (Suppl  1).     Lab Results  Component Value Date   MICROALBUR 6.22 (H) 03/27/2009   LDLCALC  10/18/2009    63        Total Cholesterol/HDL:CHD Risk Coronary Heart Disease Risk Table                     Men   Women  1/2 Average Risk   3.4   3.3  Average Risk       5.0   4.4  2 X Average Risk   9.6   7.1  3 X Average Risk  23.4   11.0        Use the calculated Patient Ratio above and the CHD Risk Table to determine the patient's CHD Risk.        ATP III CLASSIFICATION (LDL):   <100     mg/dL   Optimal  100-129  mg/dL   Near or Above                    Optimal  130-159  mg/dL   Borderline  160-189  mg/dL   High  >190     mg/dL   Very High   CREATININE 1.25 (H) 11/20/2015     HPI :  Mr. Austin Mendez presented to the ED today via EMS. He woke up this morning and took his daily doses of his amlodipine, Coreg, hydralazine and clonidine. He says then he is unsure of what happened next. His wife called EMS because he was unresponsive.   Upon EMS arrival he was SOB and diaphoretic. He also had some altered mental status, and was given Narcan. His mental status has gradually improved.   Code STEMI was initially activated for diffuse ST elevation, however it was determined that his EKG was unchanged from prior. He has early repolarization abnormalities. Cardiology was consulted for evaluation of possible HOCM as his Echo in June 2017 showed dynamic obstruction in the mid cavity with a peak velocity of 178 cm/sec and peak gradient of 13 mm Hg.   He was orthostatic with a 20 point drop in his systolic BP today. He is likely dehydrated as he is a every day drinker, he tells me he drinks one 16 ounce beer a day. He denies ever having any syncopal episodes before  today but has a recent ED visit in July for syncope. He denies chest pain and SOB  HOSPITAL COURSE:  Syncope versus near syncope while initial workup revealed orthostatic hypotension, possible alcohol intoxication however EtOH level negative,  Patient also had a 2-D echo on 09/11/15 that showed EF of 60-65% with dynamic obstruction in the mid cavity Cardiology has been consulted for possible HOCM , Echo showed normal LV systolic function with LVH and mid-cavitary obliteration but no LVOT obstruction, SAM, or asymmetric hypertrophy (no finding of hypertrophic cardiomyopathy. No arrhythmia on telemetry. Syncope likely secondary to dehydration in the setting of alcohol use and antihypertensive medications. No further cardiac  workup indicated per cardiology, cardiac enzymes negative 3   Transaminitis-AST greater than ALT, secondary to alcohol use, trending down, bilirubin within normal limits 1.0       Essential hypertension-initially held all antihypertensive medications, hydrated patient with IV fluids and repeat orthostatics in the morning, dose of Coreg, clonidine, hydralazine reduced and Norvasc discontinued.      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  .   Tobacco abuse Nicotine patch     Discharge Exam:   Blood pressure 138/70, pulse 72, temperature 98.2 F (36.8 C), temperature source Oral, resp. rate 16, height '5\' 6"'$  (1.676 m), weight 68.4 kg (150 lb 12.7 oz), SpO2 99 %. General: Pleasant, NAD Psych: Normal affect. Neuro: Alert and oriented, speech is garbled. Moves all extremities spontaneously. HEENT: Normocephalic, poor dentition.                   Neck: Supple without bruits.  Lungs:  Resp regular and unlabored, CTA. Heart: RRR no s3, s4, or murmurs. Abdomen: Soft, non-tender, non-distended, BS + x 4.  Extremities: No clubbing, cyanosis or edema. DP/PT/Radials 2+ and equal bilaterally.     Follow-up Information    Ricke Hey, MD. Schedule an appointment as soon as possible for a visit in 2 day(s).   Specialty:  Family Medicine Contact information: [address] [place] [address] [phone]           Signed: Reyne Dumas 11/20/2015, 8:29 AM         Time spent >45 mins

## 2015-11-20 NOTE — Progress Notes (Signed)
Discharge instructions and medications discussed with patient.  All questions answered.  

## 2015-11-20 NOTE — Evaluation (Signed)
Physical Therapy Evaluation Patient Details Name: Austin Mendez MRN: [id number] DOB: June 19, 1960 Today's Date: 11/20/2015   History of Present Illness  55 y.o. male with medical history significant for  poorly controlled HTN, ETOH abuse, presenting to to the ED with syncope, lightheadedness, possible STEMI, orthostatic and dehydrated.  Clinical Impression  The patient appears to be ambulating at his baseline per patient. He has  A L"Limp" on the right which was present PTA per patient. Previous PT admission notes do not indicate an abnormal gait. Uncertain of the changes noted.  The patient does demonstrate left facial weakness/drooping and his speech is slurred. Patient seems to think that was present before but doesn't know for certain.  Orthostatic BP's=supine 182/103 Sit +162/106 Standing 3 min.=159/104  after ambulation =180/101. RN aware of findings. Patient reports feeling  A little "fluffy  in the head".  The patient has DC orders today.  Follow Up Recommendations No PT follow up    Equipment Recommendations  None recommended by PT    Recommendations for Other Services       Precautions / Restrictions Precautions Precautions: Fall Precaution Comments: check BP- it's high      Mobility  Bed Mobility Overal bed mobility: Independent                Transfers Overall transfer level: Independent                  Ambulation/Gait Ambulation/Gait assistance: Supervision Ambulation Distance (Feet): 80 Feet Assistive device: None Gait Pattern/deviations: Step-through pattern;Decreased weight shift to left;Wide base of support;Antalgic     General Gait Details: decreased knee flexion on left during swing, tends to  have leg to the side  Stairs            Wheelchair Mobility    Modified Rankin (Stroke Patients Only)       Balance Overall balance assessment: Needs assistance;History of Falls Sitting-balance support: Feet supported;No upper  extremity supported Sitting balance-Leahy Scale: Good     Standing balance support: During functional activity;No upper extremity supported Standing balance-Leahy Scale: Fair                               Pertinent Vitals/Pain Pain Assessment: No/denies pain    Home Living Family/patient expects to be discharged to:: Private residence Living Arrangements: Spouse/significant other;Children Available Help at Discharge: Family Type of Home: House Home Access: Stairs to enter Entrance Stairs-Rails: Left Entrance Stairs-Number of Steps: 4 Home Layout: One level Home Equipment: None      Prior Function Level of Independence: Independent               Hand Dominance        Extremity/Trunk Assessment   Upper Extremity Assessment: Overall WFL for tasks assessed           Lower Extremity Assessment: LLE deficits/detail   LLE Deficits / Details: limps on the leg, tends to  have the leg to the side and drags it somewhat  Cervical / Trunk Assessment: Other exceptions  Communication   Communication: Expressive difficulties (speech is slurred)  Cognition Arousal/Alertness: Awake/alert Behavior During Therapy: WFL for tasks assessed/performed Overall Cognitive Status: patient is not clear about his prior ambulation and left facial droop and slurred speech                    General Comments      Exercises  Assessment/Plan    PT Assessment Patent does not need any further PT services  PT Diagnosis Difficulty walking;Abnormality of gait   PT Problem List    PT Treatment Interventions     PT Goals (Current goals can be found in the Care Plan section) Acute Rehab PT Goals Patient Stated Goal: to go home PT Goal Formulation: All assessment and education complete, DC therapy    Frequency     Barriers to discharge        Co-evaluation               End of Session Equipment Utilized During Treatment: Gait belt Activity  Tolerance: Patient tolerated treatment well Patient left: in bed;with call bell/phone within reach;with bed alarm set;with nursing/sitter in room Nurse Communication: Mobility status    Functional Assessment Tool Used: clinical judgement Functional Limitation: Mobility: Walking and moving around Mobility: Walking and Moving Around Current Status ([id number](G8978): At least 1 percent but less than 20 percent impaired, limited or restricted Mobility: Walking and Moving Around Goal Status [phone](G8979): At least 1 percent but less than 20 percent impaired, limited or restricted Mobility: Walking and Moving Around Discharge Status [phone](G8980): At least 1 percent but less than 20 percent impaired, limited or restricted    Time: [id number] PT Time Calculation (min) (ACUTE ONLY): 26 min   Charges:   PT Evaluation $PT Eval Low Complexity: 1 Procedure PT Treatments $Gait Training: 8-22 mins   PT G Codes:   PT G-Codes **NOT FOR INPATIENT CLASS** Functional Assessment Tool Used: clinical judgement Functional Limitation: Mobility: Walking and moving around Mobility: Walking and Moving Around Current Status ([id number](G8978): At least 1 percent but less than 20 percent impaired, limited or restricted Mobility: Walking and Moving Around Goal Status [phone]([id number]): At least 1 percent but less than 20 percent impaired, limited or restricted Mobility: Walking and Moving Around Discharge Status [phone](G8980): At least 1 percent but less than 20 percent impaired, limited or restricted    Rada HayHill, Tavin Vernet Elizabeth 11/20/2015, 2:35 PM Blanchard KelchKaren Ariany Kesselman PT [phone]

## 2016-05-05 ENCOUNTER — Other Ambulatory Visit: Payer: Self-pay | Admitting: Internal Medicine

## 2016-05-05 DIAGNOSIS — G8190 Hemiplegia, unspecified affecting unspecified side: Secondary | ICD-10-CM

## 2016-05-13 ENCOUNTER — Ambulatory Visit
Admission: RE | Admit: 2016-05-13 | Discharge: 2016-05-13 | Disposition: A | Discharge status: Home / Self Care | Payer: Medicaid Other | Source: Ambulatory Visit | Attending: Internal Medicine | Admitting: Internal Medicine

## 2016-05-13 DIAGNOSIS — G8190 Hemiplegia, unspecified affecting unspecified side: Secondary | ICD-10-CM

## 2016-05-13 MED ORDER — GADOBENATE DIMEGLUMINE 529 MG/ML IV SOLN
15.0000 mL | Freq: Once | INTRAVENOUS | Status: AC | PRN
  Administered 2016-05-13: 15 mL via INTRAVENOUS

## 2016-05-14 ENCOUNTER — Other Ambulatory Visit: Payer: Self-pay | Admitting: Internal Medicine

## 2016-05-14 DIAGNOSIS — I639 Cerebral infarction, unspecified: Secondary | ICD-10-CM

## 2016-05-15 ENCOUNTER — Other Ambulatory Visit: Payer: Self-pay | Admitting: Internal Medicine

## 2016-05-15 DIAGNOSIS — I6389 Other cerebral infarction: Secondary | ICD-10-CM

## 2016-05-30 ENCOUNTER — Ambulatory Visit (HOSPITAL_COMMUNITY): Payer: Medicaid Other | Attending: Internal Medicine

## 2016-05-30 ENCOUNTER — Other Ambulatory Visit: Payer: Self-pay

## 2016-05-30 DIAGNOSIS — I639 Cerebral infarction, unspecified: Secondary | ICD-10-CM | POA: Diagnosis not present

## 2016-05-30 DIAGNOSIS — I119 Hypertensive heart disease without heart failure: Secondary | ICD-10-CM | POA: Insufficient documentation

## 2016-05-30 DIAGNOSIS — Z720 Tobacco use: Secondary | ICD-10-CM | POA: Insufficient documentation

## 2016-08-08 ENCOUNTER — Other Ambulatory Visit: Payer: Self-pay | Admitting: Internal Medicine

## 2016-08-08 DIAGNOSIS — R1013 Epigastric pain: Secondary | ICD-10-CM

## 2016-08-08 DIAGNOSIS — R609 Edema, unspecified: Secondary | ICD-10-CM

## 2016-08-08 DIAGNOSIS — R52 Pain, unspecified: Secondary | ICD-10-CM

## 2016-08-22 ENCOUNTER — Other Ambulatory Visit: Payer: Medicaid Other

## 2016-08-26 ENCOUNTER — Ambulatory Visit
Admission: RE | Admit: 2016-08-26 | Discharge: 2016-08-26 | Disposition: A | Discharge status: Home / Self Care | Payer: Medicaid Other | Source: Ambulatory Visit | Attending: Internal Medicine | Admitting: Internal Medicine

## 2016-08-26 DIAGNOSIS — R609 Edema, unspecified: Secondary | ICD-10-CM

## 2016-08-26 DIAGNOSIS — R52 Pain, unspecified: Secondary | ICD-10-CM

## 2016-08-27 ENCOUNTER — Other Ambulatory Visit: Payer: Medicaid Other

## 2016-09-09 ENCOUNTER — Ambulatory Visit
Admission: RE | Admit: 2016-09-09 | Discharge: 2016-09-09 | Disposition: A | Discharge status: Home / Self Care | Payer: Medicaid Other | Source: Ambulatory Visit | Attending: Internal Medicine | Admitting: Internal Medicine

## 2016-09-09 DIAGNOSIS — R1013 Epigastric pain: Secondary | ICD-10-CM

## 2016-10-16 ENCOUNTER — Encounter (HOSPITAL_COMMUNITY): Payer: Self-pay | Admitting: Emergency Medicine

## 2016-10-16 ENCOUNTER — Emergency Department (HOSPITAL_COMMUNITY)
Admission: EM | Admit: 2016-10-16 | Discharge: 2016-10-16 | Disposition: A | Discharge status: Home / Self Care | Payer: Medicaid Other | Attending: Emergency Medicine | Admitting: Emergency Medicine

## 2016-10-16 DIAGNOSIS — F17210 Nicotine dependence, cigarettes, uncomplicated: Secondary | ICD-10-CM | POA: Diagnosis not present

## 2016-10-16 DIAGNOSIS — I10 Essential (primary) hypertension: Secondary | ICD-10-CM | POA: Diagnosis not present

## 2016-10-16 LAB — BASIC METABOLIC PANEL
Anion gap: 13 (ref 5–15)
BUN: 8 mg/dL (ref 6–20)
CALCIUM: 9.6 mg/dL (ref 8.9–10.3)
CO2: 24 mmol/L (ref 22–32)
Chloride: 101 mmol/L (ref 101–111)
Creatinine, Ser: 1.07 mg/dL (ref 0.61–1.24)
GFR calc Af Amer: >60 mL/min (ref >60)
GLUCOSE: 95 mg/dL (ref 65–99)
Potassium: 3.2 mmol/L — ABNORMAL LOW (ref 3.5–5.1)
SODIUM: 138 mmol/L (ref 135–145)

## 2016-10-16 LAB — CBC WITH DIFFERENTIAL/PLATELET
Basophils Absolute: 0 10*3/uL (ref 0.0–0.1)
Basophils Relative: 1 %
EOS ABS: 0 10*3/uL (ref 0.0–0.7)
EOS PCT: 1 %
HCT: 37.4 % — ABNORMAL LOW (ref 39.0–52.0)
Hemoglobin: 12.5 g/dL — ABNORMAL LOW (ref 13.0–17.0)
LYMPHS PCT: 28 %
Lymphs Abs: 0.9 10*3/uL (ref 0.7–4.0)
MCH: 28.5 pg (ref 26.0–34.0)
MCHC: 33.4 g/dL (ref 30.0–36.0)
MCV: 85.2 fL (ref 78.0–100.0)
MONO ABS: 0.3 10*3/uL (ref 0.1–1.0)
Monocytes Relative: 7 %
Neutro Abs: 2.1 10*3/uL (ref 1.7–7.7)
Neutrophils Relative %: 63 %
PLATELETS: 273 10*3/uL (ref 150–400)
RBC: 4.39 MIL/uL (ref 4.22–5.81)
RDW: 15.1 % (ref 11.5–15.5)
WBC: 3.4 10*3/uL — AB (ref 4.0–10.5)

## 2016-10-16 MED ORDER — PROMETHAZINE HCL 25 MG/ML IJ SOLN
25.0000 mg | Freq: Once | INTRAMUSCULAR | Status: AC
  Administered 2016-10-16: 25 mg via INTRAMUSCULAR
  Filled 2016-10-16: qty 1

## 2016-10-16 MED ORDER — CARVEDILOL 12.5 MG PO TABS: 12.5000 mg | ORAL_TABLET | Freq: Two times a day (BID) | ORAL | 0 refills | Status: DC

## 2016-10-16 MED ORDER — HYDRALAZINE HCL 50 MG PO TABS
25.0000 mg | ORAL_TABLET | Freq: Once | ORAL | Status: AC
  Administered 2016-10-16: 25 mg via ORAL
  Filled 2016-10-16: qty 1

## 2016-10-16 MED ORDER — HYDRALAZINE HCL 25 MG PO TABS: 25.0000 mg | ORAL_TABLET | Freq: Three times a day (TID) | ORAL | 0 refills | Status: DC

## 2016-10-16 MED ORDER — CARVEDILOL 12.5 MG PO TABS
12.5000 mg | ORAL_TABLET | Freq: Once | ORAL | Status: AC
  Administered 2016-10-16: 12.5 mg via ORAL
  Filled 2016-10-16: qty 1

## 2016-10-16 MED ORDER — CLONIDINE HCL 0.2 MG PO TABS: 0.1000 mg | ORAL_TABLET | Freq: Two times a day (BID) | ORAL | 0 refills | Status: DC

## 2016-10-16 MED ORDER — CARVEDILOL 12.5 MG PO TABS: 12.5000 mg | ORAL_TABLET | Freq: Two times a day (BID) | ORAL | Status: DC

## 2016-10-16 MED ORDER — CLONIDINE HCL 0.1 MG PO TABS
0.1000 mg | ORAL_TABLET | Freq: Once | ORAL | Status: AC
  Administered 2016-10-16: 0.1 mg via ORAL
  Filled 2016-10-16: qty 1

## 2016-10-16 MED ORDER — LABETALOL HCL 5 MG/ML IV SOLN
10.0000 mg | Freq: Once | INTRAVENOUS | Status: DC
  Filled 2016-10-16: qty 4

## 2016-10-16 NOTE — ED Provider Notes (Signed)
[facility]-EMERGENCY DEPT Provider Note   CSN: [id number] Arrival date & time: 10/16/16  [id number]     History   Chief Complaint Chief Complaint  Patient presents with  . Hypertension    HPI Austin Mendez is a 56 y.o. male.  The history is provided by the patient.  Hypertension  This is a recurrent problem. The current episode started yesterday. The problem occurs constantly. The problem has not changed since onset.Pertinent negatives include no chest pain, no abdominal pain, no headaches and no shortness of breath. Nothing aggravates the symptoms. Nothing relieves the symptoms. He has tried nothing for the symptoms. The treatment provided no relief.    56 year old with history of HTN who presents with dizziness. Describes feeling as "swimmy headed." Feeling started last night. States he ran out of 2 of his medications. Measured his BP at home and it was really elevated. Denies HA, dyspnea, chest pain, vision or speech changes, focal numbness/weakness, difficulty urinating, LE edema.   Past Medical History:  Diagnosis Date  . Dizziness and giddiness   . Hypertension   . Shortness of breath   . Syncope and collapse   . Unspecified essential hypertension     Patient Active Problem List   Diagnosis Date Noted  . Orthostatic dizziness   . Altered mental status   . AKI (acute kidney injury) (HCC)   . Uncontrolled hypertension   . Noncompliance with treatment   . Tobacco abuse   . Hypomagnesemia 09/07/2015  . Epigastric pain 09/07/2015  . ETOH abuse 09/07/2015  . Near syncope   . Medical non-compliance   . 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 Syncope and collapse 08/02/2012  . Facial fracture due to fall (HCC) 08/02/2012  . Hypokalemia 08/02/2012  . Acute kidney injury (HCC) 08/02/2012  . Syncope 08/02/2012  . Alcohol abuse 04/20/2006  . TOBACCO ABUSE 04/20/2006  . Essential hypertension 04/20/2006    History reviewed. No pertinent surgical history.     Home  Medications    Prior to Admission medications   Medication Sig Start Date End Date Taking? Authorizing Provider  hydrALAZINE (APRESOLINE) 25 MG tablet Take 1 tablet (25 mg total) by mouth every 8 (eight) hours. 11/20/15  Yes Richarda OverlieAbrol, Nayana, MD  carvedilol (COREG) 12.5 MG tablet Take 1 tablet (12.5 mg total) by mouth 2 (two) times daily with a meal. Patient not taking: Reported on 10/16/2016 11/20/15   Richarda OverlieAbrol, Nayana, MD  carvedilol (COREG) 12.5 MG tablet Take 1 tablet (12.5 mg total) by mouth 2 (two) times daily with a meal. 10/16/16   Lavera GuiseLiu, Kaelem Brach Duo, MD  cloNIDine (CATAPRES) 0.1 MG tablet Take 1 tablet (0.1 mg total) by mouth 2 (two) times daily. Patient not taking: Reported on 10/16/2016 11/20/15   Richarda OverlieAbrol, Nayana, MD  cloNIDine (CATAPRES) 0.2 MG tablet Take 0.5 tablets (0.1 mg total) by mouth 2 (two) times daily. 10/16/16   Lavera GuiseLiu, Teosha Casso Duo, MD  feeding supplement, ENSURE ENLIVE, (ENSURE ENLIVE) LIQD Take 237 mLs by mouth 2 (two) times daily between meals. Patient not taking: Reported on 10/06/2015 09/21/15   Berton Mountgbata, Sylvester I, MD  folic acid (FOLVITE) 1 MG tablet Take 1 tablet (1 mg total) by mouth daily. Patient not taking: Reported on 10/16/2016 09/21/15   Berton Mountgbata, Sylvester I, MD  hydrALAZINE (APRESOLINE) 25 MG tablet Take 1 tablet (25 mg total) by mouth 3 (three) times daily. 10/16/16   Lavera GuiseLiu, Clayden Withem Duo, MD  magnesium oxide (MAG-OX) 400 MG tablet Take 1 tablet (400 mg total) by  mouth 2 (two) times daily. Patient not taking: Reported on 10/16/2016 09/21/15   Berton Mount I, MD  Multiple Vitamin (MULTIVITAMIN WITH MINERALS) TABS tablet Take 1 tablet by mouth daily. Patient not taking: Reported on 10/16/2016 09/21/15   Berton Mount I, MD  potassium chloride SA (K-DUR,KLOR-CON) 20 MEQ tablet Take 1 tablet (20 mEq total) by mouth daily. Patient not taking: Reported on 10/16/2016 09/07/15   Pisciotta, Joni Reining, PA-C  promethazine (PHENERGAN) 25 MG tablet Take 1 tablet (25 mg total) by mouth every 6 (six) hours  as needed for nausea or vomiting. Patient not taking: Reported on 10/16/2016 09/07/15   Pisciotta, Joni Reining, PA-C  thiamine 100 MG tablet Take 1 tablet (100 mg total) by mouth daily. Patient not taking: Reported on 10/16/2016 09/21/15   Berton Mount I, MD  thiamine 100 MG tablet Take 1 tablet (100 mg total) by mouth daily. Patient not taking: Reported on 10/16/2016 11/20/15   Richarda Overlie, MD    Family History Family History  Problem Relation Age of Onset  . Cancer Mother   . Diabetes Father   . Hypertension Unknown     Social History Social History  Substance Use Topics  . Smoking status: Current Every Day Smoker    Packs/day: 10.00    Types: Cigarettes  . Smokeless tobacco: Never Used  . Alcohol use 0.0 oz/week     Comment: Smells of ETOH  St's just stopped drinking     Allergies   Patient has no known allergies.   Review of Systems Review of Systems  Constitutional: Negative for fever.  Respiratory: Negative for shortness of breath.   Cardiovascular: Negative for chest pain.  Gastrointestinal: Negative for abdominal pain.  Neurological: Negative for headaches.  All other systems reviewed and are negative.    Physical Exam Updated Vital Signs BP (!) 187/117   Pulse 74   Temp 97.9 F (36.6 C) (Oral)   Resp 17   Ht 5\' 9"  (1.753 m)   Wt 68.5 kg (151 lb)   SpO2 95%   BMI 22.30 kg/m   Physical Exam Physical Exam  Nursing note and vitals reviewed. Constitutional: Well developed, well nourished, non-toxic, and in no acute distress Head: Normocephalic and atraumatic.  Mouth/Throat: Oropharynx is clear and moist.  Neck: Normal range of motion. Neck supple.  Cardiovascular: Normal rate and regular rhythm.   Pulmonary/Chest: Effort normal and breath sounds normal.  Abdominal: Soft. There is no tenderness. There is no rebound and no guarding.  Musculoskeletal: Normal range of motion.  Skin: Skin is warm and dry.  Psychiatric: Cooperative Neurological:  Alert,  oriented to person, place, time, and situation. Memory grossly in tact. Fluent speech. No dysarthria or aphasia.  Cranial nerves: VF in tact. EOMI without nystagmus. No gaze deviation. Facial muscles symmetric with activation. Sensation to light touch over face in tact bilaterally. Hearing grossly in tact. Palate elevates symmetrically. Head turn and shoulder shrug are intact. Tongue midline.  Reflexes defered.  Muscle bulk and tone normal. No pronator drift. Moves all extremities symmetrically. Sensation to light touch is in tact throughout in bilateral upper and lower extremities. Coordination reveals no dysmetria with finger to nose. Gait is steady. Non-ataxic.    ED Treatments / Results  Labs (all labs ordered are listed, but only abnormal results are displayed) Labs Reviewed  CBC WITH DIFFERENTIAL/PLATELET - Abnormal; Notable for the following:       Result Value   WBC 3.4 (*)    Hemoglobin 12.5 (*)  HCT 37.4 (*)    All other components within normal limits  BASIC METABOLIC PANEL - Abnormal; Notable for the following:    Potassium 3.2 (*)    All other components within normal limits    EKG  EKG Interpretation None       Radiology No results found.  Procedures Procedures (including critical care time)  Medications Ordered in ED Medications  labetalol (NORMODYNE,TRANDATE) injection 10 mg (not administered)  cloNIDine (CATAPRES) tablet 0.1 mg (0.1 mg Oral Given 10/16/16 0725)  hydrALAZINE (APRESOLINE) tablet 25 mg (25 mg Oral Given 10/16/16 0724)  carvedilol (COREG) tablet 12.5 mg (12.5 mg Oral Given 10/16/16 0742)  promethazine (PHENERGAN) injection 25 mg (25 mg Intramuscular Given 10/16/16 [id number])     Initial Impression / Assessment and Plan / ED Course  I have reviewed the triage vital signs and the nursing notes.  Pertinent labs & imaging results that were available during my care of the patient were reviewed by me and considered in my medical decision making (see  chart for details).     56 year old male who presents with dizziness in the setting of hypertension. Blood pressures are elevated 180-220s/100-110s. Neuro intact. Otherwise asymptomatic. Exam is nonfocal. I do not feel the presentation at this time is concerning for stroke. He has been able to ambulate steadily, without dysmetria, and with neuro exam that does not suggest cerebellar process.  I reviewed patient's medications with him. He states that he is on 3 medications, one which he takes in the morning, one around lunchtime, and one in the evening. However on chart review, he is supposed be on clonidine twice a day, hydralazine 3 times a day, and Coreg twice a day. He appears to be taking his medications incorrectly. When the pharmacist also reconciled his medication, he was not taking any. Suspect that he has had some long-standing blood pressures for a while. I did give him his home medications here in the emergency department with some lowering of his blood pressure. I still hypertensive, but given suspected prolonged hypertension for quite some time felt that it was adequate lowering until he has follow-up with a PCP No PCP at this time. Plan to consult CM regarding close follow-up for BP monitoring in one week.  Final Clinical Impressions(s) / ED Diagnoses   Final diagnoses:  Essential hypertension    New Prescriptions New Prescriptions   CARVEDILOL (COREG) 12.5 MG TABLET    Take 1 tablet (12.5 mg total) by mouth 2 (two) times daily with a meal.   CLONIDINE (CATAPRES) 0.2 MG TABLET    Take 0.5 tablets (0.1 mg total) by mouth 2 (two) times daily.   HYDRALAZINE (APRESOLINE) 25 MG TABLET    Take 1 tablet (25 mg total) by mouth 3 (three) times daily.     Lavera GuiseLiu, Brookie Wayment Duo, MD 10/16/16 1045

## 2016-10-16 NOTE — ED Notes (Signed)
Pt continues to drive heave (spits up); RN explained importance of keeping BP pills down.

## 2016-10-16 NOTE — Discharge Instructions (Signed)
YOU NEED TO TAKE YOUR BLOOD PRESSURE MEDICATIONS AS PRESCRIBED.  You have an primary care doctor appointment set up for you next week.  Return for worsening symptoms, including intractable vomiting, unable to walk, confusion or any other symptoms concerning to you.

## 2016-10-16 NOTE — ED Notes (Signed)
Lunch given and bus pass given.

## 2016-10-16 NOTE — ED Triage Notes (Signed)
Pt comes from home complaining of high blood pressure. Pt denies any pain and only wants blood pressure medication.

## 2016-10-23 ENCOUNTER — Ambulatory Visit: Payer: Medicaid Other | Admitting: Family Medicine

## 2016-11-13 IMAGING — CR DG CHEST 2V
2 series · 2 of 2 positions shown · non-contrast
Comparison: 09/19/2015, 09/18/2015, CT 09/19/2015

CLINICAL DATA: 55-year-old male with a history of fall

EXAM:
CHEST  2 VIEW

[w chest lat]
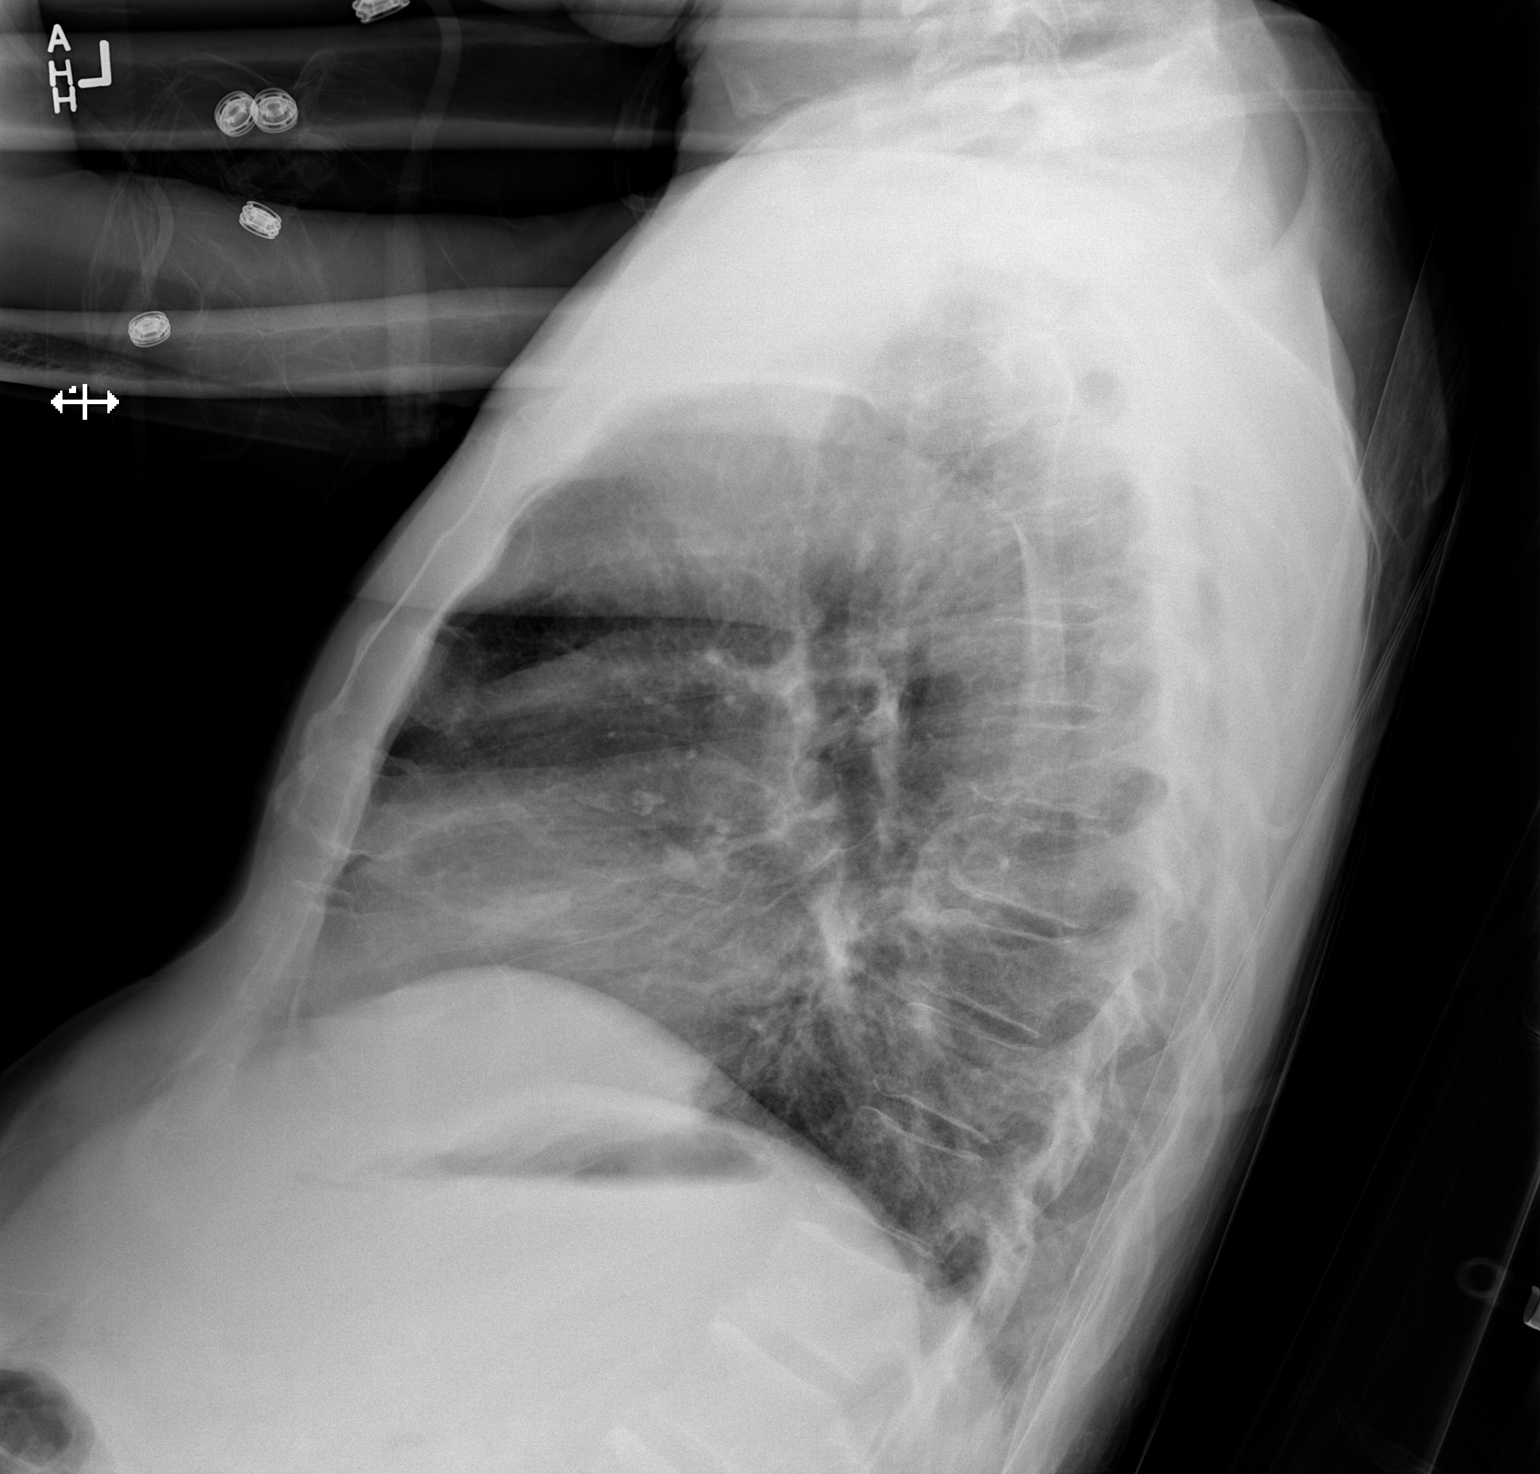

[x chest ap]
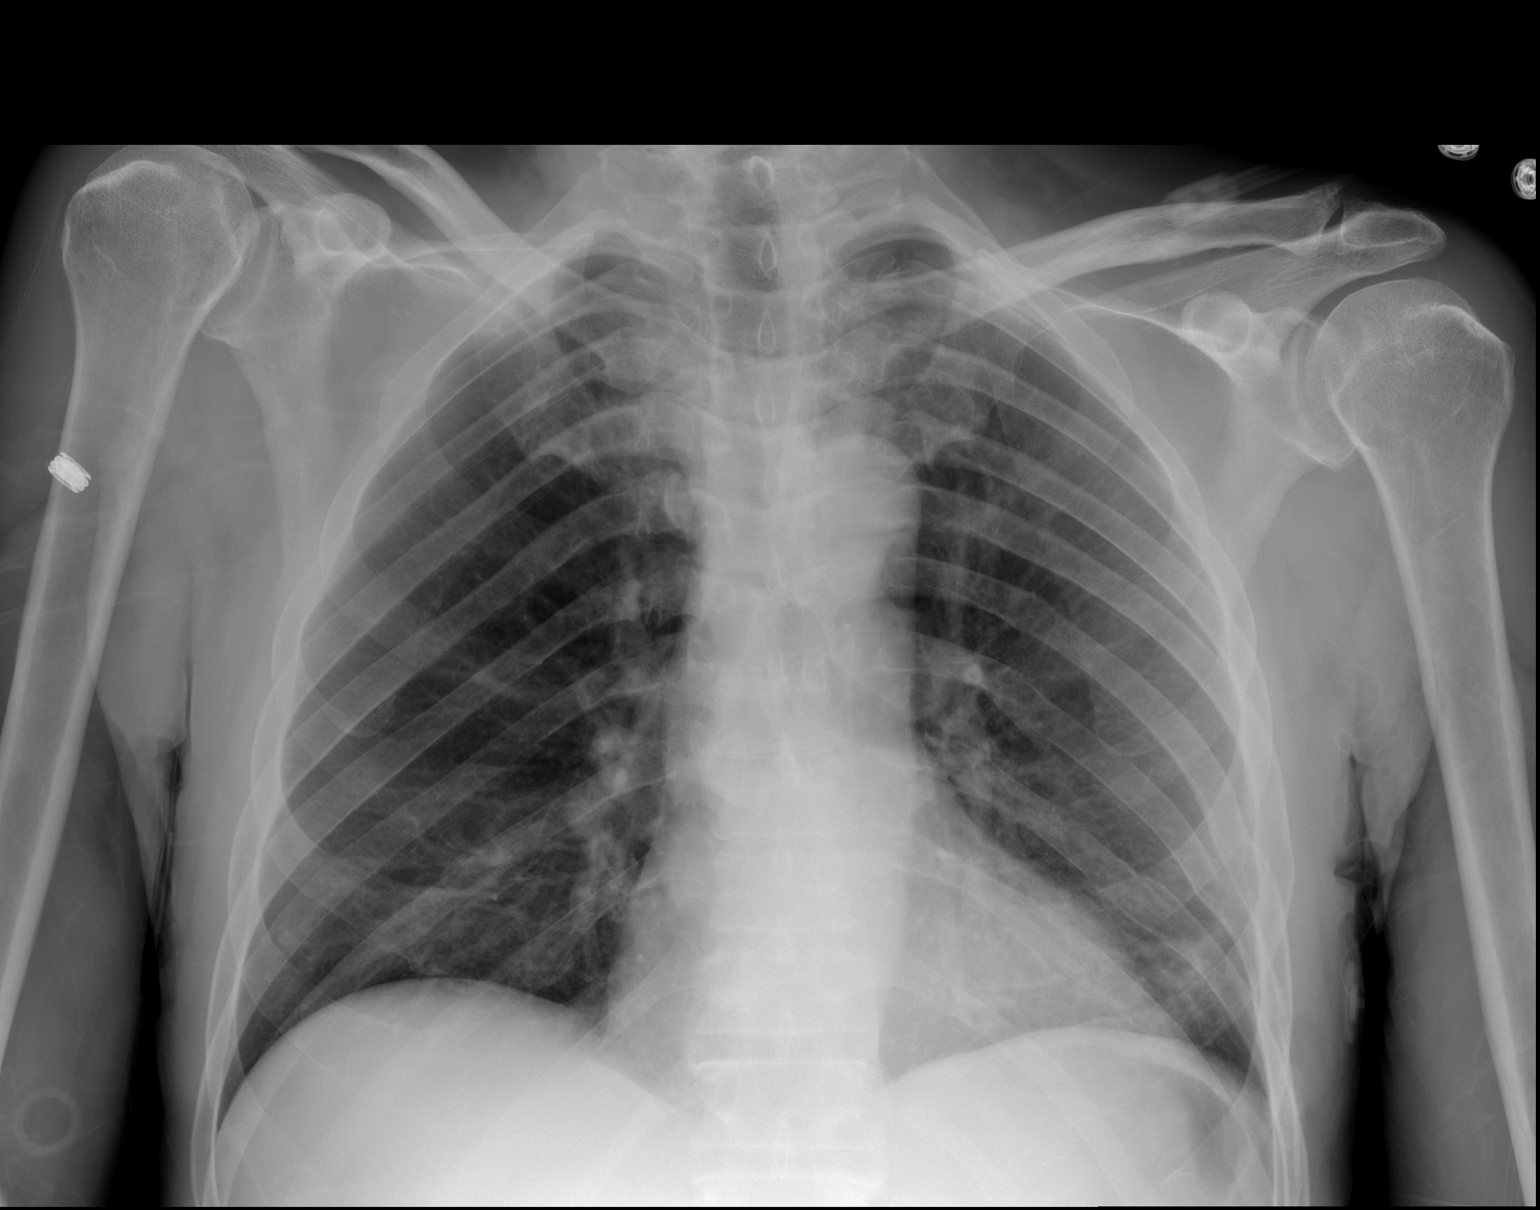

[2 of 2 positions shown; findings below may reference images not displayed]

FINDINGS: Cardiomediastinal silhouette unchanged in size and contour.

No evidence of pulmonary vascular congestion or interlobular septal
thickening.

Coarsened interstitial markings, similar to prior.

No pneumothorax. No confluent airspace disease. No pleural effusion.

Improved aeration compared to the prior

No displaced fracture.
IMPRESSION: Compare to prior plain film there is improving aeration at the lung
bases, with no evidence on the current study of acute
cardiopulmonary disease.

Sub solid nodule of the left lung base, better characterized on
prior CT of August 2015. As was noted in [REDACTED], follow-up CT
recommended at six-month intervals (future CT [DATE].

## 2017-02-28 ENCOUNTER — Encounter (HOSPITAL_COMMUNITY): Payer: Self-pay | Admitting: Emergency Medicine

## 2017-02-28 ENCOUNTER — Emergency Department (HOSPITAL_COMMUNITY)
Admission: EM | Admit: 2017-02-28 | Discharge: 2017-02-28 | Disposition: A | Discharge status: Home / Self Care | Payer: Medicaid Other | Attending: Emergency Medicine | Admitting: Emergency Medicine

## 2017-02-28 ENCOUNTER — Emergency Department (HOSPITAL_COMMUNITY): Payer: Medicaid Other

## 2017-02-28 DIAGNOSIS — I10 Essential (primary) hypertension: Secondary | ICD-10-CM | POA: Diagnosis not present

## 2017-02-28 DIAGNOSIS — R112 Nausea with vomiting, unspecified: Secondary | ICD-10-CM | POA: Diagnosis not present

## 2017-02-28 DIAGNOSIS — R1084 Generalized abdominal pain: Secondary | ICD-10-CM | POA: Diagnosis not present

## 2017-02-28 DIAGNOSIS — F17210 Nicotine dependence, cigarettes, uncomplicated: Secondary | ICD-10-CM | POA: Insufficient documentation

## 2017-02-28 DIAGNOSIS — Z79899 Other long term (current) drug therapy: Secondary | ICD-10-CM | POA: Insufficient documentation

## 2017-02-28 DIAGNOSIS — R42 Dizziness and giddiness: Secondary | ICD-10-CM

## 2017-02-28 LAB — CBC
HCT: 32.8 % — ABNORMAL LOW (ref 39.0–52.0)
Hemoglobin: 10.8 g/dL — ABNORMAL LOW (ref 13.0–17.0)
MCH: 24.6 pg — ABNORMAL LOW (ref 26.0–34.0)
MCHC: 32.9 g/dL (ref 30.0–36.0)
MCV: 74.7 fL — ABNORMAL LOW (ref 78.0–100.0)
PLATELETS: 394 10*3/uL (ref 150–400)
RBC: 4.39 MIL/uL (ref 4.22–5.81)
RDW: 18.1 % — AB (ref 11.5–15.5)
WBC: 5.1 10*3/uL (ref 4.0–10.5)

## 2017-02-28 LAB — COMPREHENSIVE METABOLIC PANEL
ALBUMIN: 3.4 g/dL — AB (ref 3.5–5.0)
ALK PHOS: 81 U/L (ref 38–126)
ALT: 35 U/L (ref 17–63)
AST: 55 U/L — AB (ref 15–41)
Anion gap: 12 (ref 5–15)
BILIRUBIN TOTAL: 0.4 mg/dL (ref 0.3–1.2)
BUN: 8 mg/dL (ref 6–20)
CALCIUM: 9.2 mg/dL (ref 8.9–10.3)
CO2: 23 mmol/L (ref 22–32)
Chloride: 100 mmol/L — ABNORMAL LOW (ref 101–111)
Creatinine, Ser: 1.08 mg/dL (ref 0.61–1.24)
GFR calc Af Amer: >60 mL/min (ref >60)
GFR calc non Af Amer: >60 mL/min (ref >60)
GLUCOSE: 91 mg/dL (ref 65–99)
Potassium: 3.2 mmol/L — ABNORMAL LOW (ref 3.5–5.1)
Sodium: 135 mmol/L (ref 135–145)
TOTAL PROTEIN: 8.5 g/dL — AB (ref 6.5–8.1)

## 2017-02-28 LAB — URINALYSIS, ROUTINE W REFLEX MICROSCOPIC
Bacteria, UA: NONE SEEN
Bilirubin Urine: NEGATIVE
GLUCOSE, UA: NEGATIVE mg/dL
HGB URINE DIPSTICK: NEGATIVE
Ketones, ur: NEGATIVE mg/dL
Leukocytes, UA: NEGATIVE
NITRITE: NEGATIVE
Protein, ur: 100 mg/dL — AB
Squamous Epithelial / LPF: NONE SEEN
pH: 6 (ref 5.0–8.0)

## 2017-02-28 LAB — TROPONIN I: Troponin I: <0.03 ng/mL (ref <0.03)

## 2017-02-28 LAB — LIPASE, BLOOD: Lipase: 31 U/L (ref 11–51)

## 2017-02-28 LAB — RAPID URINE DRUG SCREEN, HOSP PERFORMED
AMPHETAMINES: NOT DETECTED
BARBITURATES: NOT DETECTED
BENZODIAZEPINES: NOT DETECTED
Cocaine: NOT DETECTED
Opiates: NOT DETECTED
Tetrahydrocannabinol: NOT DETECTED

## 2017-02-28 LAB — ETHANOL

## 2017-02-28 LAB — I-STAT TROPONIN, ED: Troponin i, poc: 0.02 ng/mL (ref 0.00–0.08)

## 2017-02-28 MED ORDER — CARVEDILOL 12.5 MG PO TABS: 25.0000 mg | ORAL_TABLET | Freq: Two times a day (BID) | ORAL | Status: DC

## 2017-02-28 MED ORDER — CARVEDILOL 12.5 MG PO TABS
25.0000 mg | ORAL_TABLET | Freq: Once | ORAL | Status: AC
  Administered 2017-02-28: 25 mg via ORAL
  Filled 2017-02-28: qty 2

## 2017-02-28 MED ORDER — ONDANSETRON HCL 4 MG/2ML IJ SOLN: 4.0000 mg | Freq: Once | INTRAMUSCULAR | Status: DC

## 2017-02-28 MED ORDER — SODIUM CHLORIDE 0.9 % IV BOLUS (SEPSIS)
1000.0000 mL | Freq: Once | INTRAVENOUS | Status: AC
  Administered 2017-02-28: 1000 mL via INTRAVENOUS

## 2017-02-28 MED ORDER — HYDRALAZINE HCL 25 MG PO TABS
25.0000 mg | ORAL_TABLET | Freq: Once | ORAL | Status: AC
  Administered 2017-02-28: 25 mg via ORAL
  Filled 2017-02-28: qty 1

## 2017-02-28 MED ORDER — ONDANSETRON HCL 4 MG/2ML IJ SOLN
4.0000 mg | Freq: Once | INTRAMUSCULAR | Status: AC | PRN
  Administered 2017-02-28: 4 mg via INTRAVENOUS
  Filled 2017-02-28: qty 2

## 2017-02-28 MED ORDER — HYDRALAZINE HCL 20 MG/ML IJ SOLN
20.0000 mg | Freq: Once | INTRAMUSCULAR | Status: AC
  Administered 2017-02-28: 20 mg via INTRAVENOUS
  Filled 2017-02-28: qty 1

## 2017-02-28 MED ORDER — CLONIDINE HCL 0.2 MG PO TABS
0.3000 mg | ORAL_TABLET | Freq: Once | ORAL | Status: AC
  Administered 2017-02-28: 07:00:00 0.3 mg via ORAL
  Filled 2017-02-28: qty 1

## 2017-02-28 MED ORDER — IOPAMIDOL (ISOVUE-370) INJECTION 76%
INTRAVENOUS | Status: AC
  Administered 2017-02-28: 100 mL
  Filled 2017-02-28: qty 100

## 2017-02-28 NOTE — ED Notes (Signed)
ED Provider at bedside. 

## 2017-02-28 NOTE — Progress Notes (Signed)
CSW met with patient to address discharge concerns. Patient stated that he lived too far from the bus stop to ride the bus home and that he cannot walk. Patient stated that he is at the [facility] ED currently for high blood pressure but no other reason that he is aware of. Patient asked for assistance to call his wife, Butch Penny. CSW phoned wife and assisted patient in talking to her, patient informed him of current situation. CSW spoke to patient's wife and asked her if she was able to coordinate transportation for her husband and she stated no that she is disabled with one leg. CSW inquired with patient after completed phone call with wife about current substance abuse activity, patient stated no drug use only alcohol. Patient stated he did not want substance abuse resources at this time. CSW encouraged patient to take care of his health with mediciation compliance and decreasing his alcohol intake.   CSW completed cab voucher for patient to discharge to [address] [place], [place].  [place] signing off.  Madilyn Fireman, MSW, LCSW-A Weekend Clinical Social Worker [phone]

## 2017-02-28 NOTE — ED Triage Notes (Signed)
PT given a Cab voucher for ride home.

## 2017-02-28 NOTE — ED Triage Notes (Signed)
[name] EMS from home. Pt called out for abd pain going on for 2 days, with nausea. Denies emesis, diarrhea, bloody stools. Per EMS, pt hypertensive with SBP >200. Pt reported some dizziness and stated he felt like his BP was high. Commented to EMS that he decreased his beer intake after getting the pain.

## 2017-02-28 NOTE — ED Triage Notes (Signed)
SW at bed side to see PT

## 2017-02-28 NOTE — ED Provider Notes (Signed)
  Physical Exam  BP 126/80   Pulse 78   Temp 98.2 F (36.8 C) (Oral)   Resp 14   Ht 5\' 6"  (1.676 m)   Wt 68 kg (150 lb)   SpO2 99%   BMI 24.21 kg/m   Physical Exam  ED Course/Procedures     Procedures  MDM  Care assumed at 7:30 am. Patient hx of HTN, polysubstance abuse with chest pain, dizziness, abdominal pain. Hypertensive 220/110 in the ED initially. Poor historian, unclear when he took his antihypertensives. Given oral and IV antihypertensives and dissection study and CT head ordered.   9:36 AM CTA negative. BP down to 126/80 with BP meds. Patient likely uncompliant with his BP meds. Delta trop neg. Stable for discharge. Told him to stop using drugs and take his BP meds as prescribed. Stable for discharge.    Charlynne PanderYao, Austin Rickel Hsienta, MD 02/28/17 [phone]

## 2017-02-28 NOTE — ED Notes (Signed)
Left a voicemail for SW per MD request.

## 2017-02-28 NOTE — ED Notes (Signed)
Patient transported to CT 

## 2017-02-28 NOTE — ED Provider Notes (Signed)
TIME SEEN: 6:15 AM  CHIEF COMPLAINT: Hypertension, abdominal pain, nausea and vomiting  HPI: Patient is a 56 year old male with history of substance abuse, hypertension who presents to the emergency department with complaints of his blood pressure being elevated and having diffuse generalized abdominal pain that described with nausea and vomiting.  States this started just prior to arrival.  Patient is an extremely poor historian.  He reports that he is on 3 blood pressure medications and reports compliance.  It appears he is on clonidine 0.3 mg 3 times daily, Coreg 25 mg twice daily and hydralazine 25 mg 3 times daily.  Reports he has not taken his morning doses but cannot tell me what time he took his last dose.  He denies chest pain or shortness of breath.  Denies headache but has had blurry vision and has felt lightheaded.  No diarrhea.  No history of abdominal surgery.  He has been admitted to the hospital several times for similar symptoms.  He states he did drink alcohol last night.  States he had one beer.  He gets very angry when I asked him about cocaine use and begins yelling.  He does have white powder noted around both of his nostrils.  ROS: See HPI Constitutional: no fever  Eyes: no drainage  ENT: no runny nose   Cardiovascular:  no chest pain  Resp: no SOB  GI: no vomiting GU: no dysuria Integumentary: no rash  Allergy: no hives  Musculoskeletal: no leg swelling  Neurological: no slurred speech ROS otherwise negative  PAST MEDICAL HISTORY/PAST SURGICAL HISTORY:  Past Medical History:  Diagnosis Date  . Dizziness and giddiness   . Hypertension   . Shortness of breath   . Syncope and collapse   . Unspecified essential hypertension     MEDICATIONS:  Prior to Admission medications   Medication Sig Start Date End Date Taking? Authorizing Provider  carvedilol (COREG) 12.5 MG tablet Take 1 tablet (12.5 mg total) by mouth 2 (two) times daily with a meal. Patient not taking:  Reported on 10/16/2016 11/20/15   Richarda OverlieAbrol, Nayana, MD  carvedilol (COREG) 12.5 MG tablet Take 1 tablet (12.5 mg total) by mouth 2 (two) times daily with a meal. 10/16/16   Lavera GuiseLiu, Dana Duo, MD  cloNIDine (CATAPRES) 0.1 MG tablet Take 1 tablet (0.1 mg total) by mouth 2 (two) times daily. Patient not taking: Reported on 10/16/2016 11/20/15   Richarda OverlieAbrol, Nayana, MD  cloNIDine (CATAPRES) 0.2 MG tablet Take 0.5 tablets (0.1 mg total) by mouth 2 (two) times daily. 10/16/16   Lavera GuiseLiu, Dana Duo, MD  feeding supplement, ENSURE ENLIVE, (ENSURE ENLIVE) LIQD Take 237 mLs by mouth 2 (two) times daily between meals. Patient not taking: Reported on 10/06/2015 09/21/15   Berton Mountgbata, Sylvester I, MD  folic acid (FOLVITE) 1 MG tablet Take 1 tablet (1 mg total) by mouth daily. Patient not taking: Reported on 10/16/2016 09/21/15   Berton Mountgbata, Sylvester I, MD  hydrALAZINE (APRESOLINE) 25 MG tablet Take 1 tablet (25 mg total) by mouth every 8 (eight) hours. 11/20/15   Richarda OverlieAbrol, Nayana, MD  hydrALAZINE (APRESOLINE) 25 MG tablet Take 1 tablet (25 mg total) by mouth 3 (three) times daily. 10/16/16   Lavera GuiseLiu, Dana Duo, MD  magnesium oxide (MAG-OX) 400 MG tablet Take 1 tablet (400 mg total) by mouth 2 (two) times daily. Patient not taking: Reported on 10/16/2016 09/21/15   Berton Mountgbata, Sylvester I, MD  Multiple Vitamin (MULTIVITAMIN WITH MINERALS) TABS tablet Take 1 tablet by mouth daily. Patient not  taking: Reported on 10/16/2016 09/21/15   Berton Mountgbata, Sylvester I, MD  potassium chloride SA (K-DUR,KLOR-CON) 20 MEQ tablet Take 1 tablet (20 mEq total) by mouth daily. Patient not taking: Reported on 10/16/2016 09/07/15   Pisciotta, Joni ReiningNicole, PA-C  promethazine (PHENERGAN) 25 MG tablet Take 1 tablet (25 mg total) by mouth every 6 (six) hours as needed for nausea or vomiting. Patient not taking: Reported on 10/16/2016 09/07/15   Pisciotta, Joni ReiningNicole, PA-C  thiamine 100 MG tablet Take 1 tablet (100 mg total) by mouth daily. Patient not taking: Reported on 10/16/2016 09/21/15   Berton Mountgbata,  Sylvester I, MD  thiamine 100 MG tablet Take 1 tablet (100 mg total) by mouth daily. Patient not taking: Reported on 10/16/2016 11/20/15   Richarda OverlieAbrol, Nayana, MD    ALLERGIES:  No Known Allergies  SOCIAL HISTORY:  Social History   Tobacco Use  . Smoking status: Current Every Day Smoker    Packs/day: 10.00    Types: Cigarettes  . Smokeless tobacco: Never Used  Substance Use Topics  . Alcohol use: Yes    Alcohol/week: 0.0 oz    Comment: Smells of ETOH  St's just stopped drinking    FAMILY HISTORY: Family History  Problem Relation Age of Onset  . Cancer Mother   . Diabetes Father   . Hypertension Unknown     EXAM: BP (S) (!) 235/154 (BP Location: Right Arm)   Pulse 85   Temp 98.2 F (36.8 C) (Oral)   Resp 16   Ht 5\' 6"  (1.676 m)   Wt 68 kg (150 lb)   SpO2 100%   BMI 24.21 kg/m  CONSTITUTIONAL: Alert and oriented and responds appropriately to questions.  Chronically ill-appearing, agitated HEAD: Normocephalic EYES: Conjunctivae clear, pupils appear equal, EOMI ENT: normal nose; moist mucous membranes, extremely poor dentition with multiple missing teeth, white powder noted around both nostrils especially to the right side NECK: Supple, no meningismus, no nuchal rigidity, no LAD  CARD: RRR; S1 and S2 appreciated; no murmurs, no clicks, no rubs, no gallops RESP: Normal chest excursion without splinting or tachypnea; breath sounds clear and equal bilaterally; no wheezes, no rhonchi, no rales, no hypoxia or respiratory distress, speaking full sentences ABD/GI: Normal bowel sounds; non-distended; soft, non-tender, no rebound, no guarding, no peritoneal signs, no hepatosplenomegaly BACK:  The back appears normal and is non-tender to palpation, there is no CVA tenderness EXT: Normal ROM in all joints; non-tender to palpation; no edema; normal capillary refill; no cyanosis, no calf tenderness or swelling    SKIN: Normal color for age and race; warm; no rash NEURO: Moves all  extremities equally, reports normal sensation diffusely, cranial nerves II through XII intact, speech is difficult to understand but he states this is chronic and may be secondary to multiple missing teeth, I do not appreciate any obvious slurred speech PSYCH: The patient's mood and manner are appropriate. Grooming and personal hygiene are appropriate.  MEDICAL DECISION MAKING: Patient here with uncontrolled hypertension and.  Is unclear if he is truly taking his medications.  He states he does not have a primary care physician but it is listed as Dr. Clelia CroftShaw in our computer system and that is who is listed on his medications as well.  He gets very angry when I asked him about illicit drug use.  He does have a white powdery substance noted to both nostrils and I am concerned that he has been using cocaine.  I will avoid beta-blockers at this time.  I will order  his home doses of medications and give a dose of IV hydralazine.  Will obtain labs, urine, urine drug screen, EKG.  We will also obtain a CT of his head and a CT dissection study.  Will give nausea medicine.  ED PROGRESS: Pts CBC, CMP, lipase are normal.  ETOH negative.  Troponin, UDS, urinalysis, EKG and CT scan is pending.  Dr. Silverio Lay to follow up.  Patient's  BP is now 190/90.  If pt's workup is unremarkable and his blood pressure is controlled I do not feel it would be unreasonable to let him go home.  If his blood pressure continues to be elevated and difficult to control he may need admission.      CRITICAL CARE Performed by: Rochele Raring   Total critical care time: 45 minutes  Critical care time was exclusive of separately billable procedures and treating other patients.  Critical care was necessary to treat or prevent imminent or life-threatening deterioration.  Critical care was time spent personally by me on the following activities: development of treatment plan with patient and/or surrogate as well as nursing, discussions with  consultants, evaluation of patient's response to treatment, examination of patient, obtaining history from patient or surrogate, ordering and performing treatments and interventions, ordering and review of laboratory studies, ordering and review of radiographic studies, pulse oximetry and re-evaluation of patient's condition.    Ward, Layla Maw, DO 02/28/17 [phone]

## 2017-02-28 NOTE — Discharge Instructions (Signed)
Please take your blood pressure medicines as prescribed.   Avoid using any drugs.   See your doctor  Return to ER if you have worse dizziness, headaches, chest pain, abdominal pain.

## 2017-02-28 NOTE — ED Notes (Signed)
SW at bedside.

## 2017-03-13 ENCOUNTER — Other Ambulatory Visit: Payer: Self-pay

## 2017-03-13 ENCOUNTER — Emergency Department (HOSPITAL_COMMUNITY)
Admission: EM | Admit: 2017-03-13 | Discharge: 2017-03-14 | Disposition: A | Discharge status: Home / Self Care | Payer: Medicaid Other | Attending: Emergency Medicine | Admitting: Emergency Medicine

## 2017-03-13 DIAGNOSIS — G8929 Other chronic pain: Secondary | ICD-10-CM | POA: Diagnosis not present

## 2017-03-13 DIAGNOSIS — R1084 Generalized abdominal pain: Secondary | ICD-10-CM | POA: Diagnosis not present

## 2017-03-13 DIAGNOSIS — Z9114 Patient's other noncompliance with medication regimen: Secondary | ICD-10-CM | POA: Diagnosis not present

## 2017-03-13 DIAGNOSIS — I10 Essential (primary) hypertension: Secondary | ICD-10-CM | POA: Diagnosis present

## 2017-03-13 DIAGNOSIS — R112 Nausea with vomiting, unspecified: Secondary | ICD-10-CM | POA: Diagnosis not present

## 2017-03-13 DIAGNOSIS — Z79899 Other long term (current) drug therapy: Secondary | ICD-10-CM | POA: Insufficient documentation

## 2017-03-13 DIAGNOSIS — R109 Unspecified abdominal pain: Secondary | ICD-10-CM

## 2017-03-13 DIAGNOSIS — F17210 Nicotine dependence, cigarettes, uncomplicated: Secondary | ICD-10-CM | POA: Diagnosis not present

## 2017-03-13 DIAGNOSIS — R42 Dizziness and giddiness: Secondary | ICD-10-CM | POA: Insufficient documentation

## 2017-03-13 DIAGNOSIS — Z91199 Patient's noncompliance with other medical treatment and regimen due to unspecified reason: Secondary | ICD-10-CM

## 2017-03-13 DIAGNOSIS — Z9119 Patient's noncompliance with other medical treatment and regimen: Secondary | ICD-10-CM

## 2017-03-13 MED ORDER — ONDANSETRON HCL 4 MG/2ML IJ SOLN
4.0000 mg | Freq: Once | INTRAMUSCULAR | Status: AC
  Administered 2017-03-13: 4 mg via INTRAVENOUS
  Filled 2017-03-13: qty 2

## 2017-03-13 NOTE — ED Notes (Signed)
Bed: [id number] Expected date:  Expected time:  Means of arrival:  Comments: 56 yo M  LLQ pain, hypertension

## 2017-03-13 NOTE — ED Triage Notes (Addendum)
Pt arrived [facility] with complaints of LLQ abdominal pain for a week with nausea for last couple days. Pt complains of dizziness that began today with weakness. Pt is Hypertensive and on medications that the patient says he is compliant with.   EMS BP 233/137

## 2017-03-14 LAB — COMPREHENSIVE METABOLIC PANEL
ALK PHOS: 60 U/L (ref 38–126)
ALT: 23 U/L (ref 17–63)
ANION GAP: 5 (ref 5–15)
AST: 35 U/L (ref 15–41)
Albumin: 3.2 g/dL — ABNORMAL LOW (ref 3.5–5.0)
BILIRUBIN TOTAL: 0.5 mg/dL (ref 0.3–1.2)
BUN: 12 mg/dL (ref 6–20)
CALCIUM: 8.7 mg/dL — AB (ref 8.9–10.3)
CO2: 24 mmol/L (ref 22–32)
Chloride: 107 mmol/L (ref 101–111)
Creatinine, Ser: 1.06 mg/dL (ref 0.61–1.24)
Glucose, Bld: 87 mg/dL (ref 65–99)
Potassium: 3.5 mmol/L (ref 3.5–5.1)
SODIUM: 136 mmol/L (ref 135–145)
TOTAL PROTEIN: 7.7 g/dL (ref 6.5–8.1)

## 2017-03-14 LAB — URINALYSIS, ROUTINE W REFLEX MICROSCOPIC
Bilirubin Urine: NEGATIVE
GLUCOSE, UA: NEGATIVE mg/dL
HGB URINE DIPSTICK: NEGATIVE
KETONES UR: NEGATIVE mg/dL
Leukocytes, UA: NEGATIVE
Nitrite: NEGATIVE
PROTEIN: NEGATIVE mg/dL
Specific Gravity, Urine: 1.008 (ref 1.005–1.030)
pH: 5 (ref 5.0–8.0)

## 2017-03-14 LAB — RAPID URINE DRUG SCREEN, HOSP PERFORMED
Amphetamines: NOT DETECTED
Barbiturates: NOT DETECTED
Benzodiazepines: NOT DETECTED
Cocaine: NOT DETECTED
Opiates: NOT DETECTED
Tetrahydrocannabinol: NOT DETECTED

## 2017-03-14 LAB — CBC WITH DIFFERENTIAL/PLATELET
Basophils Absolute: 0 10*3/uL (ref 0.0–0.1)
Basophils Relative: 1 %
EOS ABS: 0.2 10*3/uL (ref 0.0–0.7)
Eosinophils Relative: 4 %
HCT: 26.6 % — ABNORMAL LOW (ref 39.0–52.0)
HEMOGLOBIN: 8.5 g/dL — AB (ref 13.0–17.0)
LYMPHS ABS: 1.9 10*3/uL (ref 0.7–4.0)
Lymphocytes Relative: 46 %
MCH: 24 pg — AB (ref 26.0–34.0)
MCHC: 32 g/dL (ref 30.0–36.0)
MCV: 75.1 fL — ABNORMAL LOW (ref 78.0–100.0)
MONOS PCT: 11 %
Monocytes Absolute: 0.4 10*3/uL (ref 0.1–1.0)
NEUTROS PCT: 38 %
Neutro Abs: 1.6 10*3/uL — ABNORMAL LOW (ref 1.7–7.7)
Platelets: 223 10*3/uL (ref 150–400)
RBC: 3.54 MIL/uL — ABNORMAL LOW (ref 4.22–5.81)
RDW: 19.4 % — ABNORMAL HIGH (ref 11.5–15.5)
WBC: 4.2 10*3/uL (ref 4.0–10.5)

## 2017-03-14 LAB — I-STAT TROPONIN, ED: TROPONIN I, POC: 0.01 ng/mL (ref 0.00–0.08)

## 2017-03-14 LAB — LIPASE, BLOOD: LIPASE: 33 U/L (ref 11–51)

## 2017-03-14 MED ORDER — GI COCKTAIL ~~LOC~~
30.0000 mL | Freq: Once | ORAL | Status: AC
  Administered 2017-03-14: 30 mL via ORAL
  Filled 2017-03-14: qty 30

## 2017-03-14 MED ORDER — CARVEDILOL 25 MG PO TABS
25.0000 mg | ORAL_TABLET | Freq: Once | ORAL | Status: AC
  Administered 2017-03-14: 25 mg via ORAL
  Filled 2017-03-14: qty 1

## 2017-03-14 MED ORDER — PANTOPRAZOLE SODIUM 40 MG PO TBEC: 40.0000 mg | DELAYED_RELEASE_TABLET | Freq: Every day | ORAL | 1 refills | Status: AC

## 2017-03-14 MED ORDER — HYDRALAZINE HCL 20 MG/ML IJ SOLN
20.0000 mg | Freq: Once | INTRAMUSCULAR | Status: AC
  Administered 2017-03-14: 20 mg via INTRAVENOUS
  Filled 2017-03-14: qty 1

## 2017-03-14 MED ORDER — CLONIDINE HCL 0.1 MG PO TABS
0.3000 mg | ORAL_TABLET | Freq: Once | ORAL | Status: AC
  Administered 2017-03-14: 0.3 mg via ORAL
  Filled 2017-03-14: qty 3

## 2017-03-14 MED ORDER — ONDANSETRON 4 MG PO TBDP: 4.0000 mg | ORAL_TABLET | Freq: Three times a day (TID) | ORAL | 0 refills | Status: DC | PRN

## 2017-03-14 MED ORDER — HYDRALAZINE HCL 25 MG PO TABS
25.0000 mg | ORAL_TABLET | Freq: Once | ORAL | Status: AC
  Administered 2017-03-14: 25 mg via ORAL
  Filled 2017-03-14: qty 1

## 2017-03-14 MED ORDER — HYDRALAZINE HCL 25 MG PO TABS: 25.0000 mg | ORAL_TABLET | Freq: Three times a day (TID) | ORAL | 1 refills | Status: DC

## 2017-03-14 NOTE — ED Provider Notes (Signed)
TIME SEEN: 1:00 AM  CHIEF COMPLAINT: Hypertension, abdominal pain, vomiting, dizziness  HPI: Patient is a 56 year old male with history of hypertension, polysubstance abuse, medical noncompliance who presents emergency department with diffuse abdominal pain that he describes as a burning pain that started today.  He has similar episodes when his blood pressure is elevated.  He is supposed to be on clonidine 0.3 mg 3 times a day, Coreg 25 mg twice daily and hydralazine 25 mg 3 times a day.  He is unable to tell me when he last took these medications.  He states he has had nausea and vomiting which is also typical for him.  Does report drinking alcohol today.  No diarrhea.  No bloody stool or melena.  No headache, vision changes.  Reports he feels lightheaded but this is chronic for him.  No vertigo.  No chest pain or shortness of breath.  Patient was seen here several weeks ago for the same and had a negative CT of his head and negative CT of his chest, abdomen and pelvis without sign of dissection or aneurysm.  ROS: See HPI Constitutional: no fever  Eyes: no drainage  ENT: no runny nose   Cardiovascular:  no chest pain  Resp: no SOB  GI: no vomiting GU: no dysuria Integumentary: no rash  Allergy: no hives  Musculoskeletal: no leg swelling  Neurological: no slurred speech ROS otherwise negative  PAST MEDICAL HISTORY/PAST SURGICAL HISTORY:  Past Medical History:  Diagnosis Date  . Dizziness and giddiness   . Hypertension   . Shortness of breath   . Syncope and collapse   . Unspecified essential hypertension     MEDICATIONS:  Prior to Admission medications   Medication Sig Start Date End Date Taking? Authorizing Provider  carvedilol (COREG) 12.5 MG tablet Take 1 tablet (12.5 mg total) by mouth 2 (two) times daily with a meal. Patient taking differently: Take 25 mg by mouth 2 (two) times daily with a meal.  10/16/16  Yes Lavera GuiseLiu, Dana Duo, MD  cloNIDine (CATAPRES) 0.2 MG tablet Take 0.5  tablets (0.1 mg total) by mouth 2 (two) times daily. Patient taking differently: Take 0.3 mg by mouth 3 (three) times daily.  10/16/16  Yes Lavera GuiseLiu, Dana Duo, MD  hydrALAZINE (APRESOLINE) 25 MG tablet Take 1 tablet (25 mg total) by mouth every 8 (eight) hours. 11/20/15  Yes Richarda OverlieAbrol, Nayana, MD  cloNIDine (CATAPRES) 0.1 MG tablet Take 1 tablet (0.1 mg total) by mouth 2 (two) times daily. Patient not taking: Reported on 10/16/2016 11/20/15   Richarda OverlieAbrol, Nayana, MD  feeding supplement, ENSURE ENLIVE, (ENSURE ENLIVE) LIQD Take 237 mLs by mouth 2 (two) times daily between meals. Patient not taking: Reported on 10/06/2015 09/21/15   Berton Mountgbata, Sylvester I, MD  folic acid (FOLVITE) 1 MG tablet Take 1 tablet (1 mg total) by mouth daily. Patient not taking: Reported on 10/16/2016 09/21/15   Berton Mountgbata, Sylvester I, MD  magnesium oxide (MAG-OX) 400 MG tablet Take 1 tablet (400 mg total) by mouth 2 (two) times daily. Patient not taking: Reported on 10/16/2016 09/21/15   Berton Mountgbata, Sylvester I, MD  Multiple Vitamin (MULTIVITAMIN WITH MINERALS) TABS tablet Take 1 tablet by mouth daily. Patient not taking: Reported on 10/16/2016 09/21/15   Berton Mountgbata, Sylvester I, MD  potassium chloride SA (K-DUR,KLOR-CON) 20 MEQ tablet Take 1 tablet (20 mEq total) by mouth daily. Patient not taking: Reported on 10/16/2016 09/07/15   Pisciotta, Joni ReiningNicole, PA-C  promethazine (PHENERGAN) 25 MG tablet Take 1 tablet (25 mg total)  by mouth every 6 (six) hours as needed for nausea or vomiting. Patient not taking: Reported on 10/16/2016 09/07/15   Pisciotta, Joni Reining, PA-C  thiamine 100 MG tablet Take 1 tablet (100 mg total) by mouth daily. Patient not taking: Reported on 10/16/2016 09/21/15   Berton Mount I, MD  thiamine 100 MG tablet Take 1 tablet (100 mg total) by mouth daily. Patient not taking: Reported on 10/16/2016 11/20/15   Richarda Overlie, MD    ALLERGIES:  No Known Allergies  SOCIAL HISTORY:  Social History   Tobacco Use  . Smoking status: Current Every Day  Smoker    Packs/day: 10.00    Types: Cigarettes  . Smokeless tobacco: Never Used  Substance Use Topics  . Alcohol use: Yes    Alcohol/week: 0.0 oz    Comment: Smells of ETOH  St's just stopped drinking    FAMILY HISTORY: Family History  Problem Relation Age of Onset  . Cancer Mother   . Diabetes Father   . Hypertension Unknown     EXAM: BP (!) 169/95   Pulse 78   Temp (!) 97.5 F (36.4 C) (Oral)   Resp 20   Ht 5\' 6"  (1.676 m)   Wt 68 kg (150 lb)   SpO2 99%   BMI 24.21 kg/m  CONSTITUTIONAL: Alert and oriented and responds appropriately to questions.  Chronically ill-appearing, does not appear in significant distress HEAD: Normocephalic EYES: Conjunctivae clear, pupils appear equal, EOMI ENT: normal nose; moist mucous membranes, poor dentition with multiple missing teeth NECK: Supple, no meningismus, no nuchal rigidity, no LAD  CARD: RRR; S1 and S2 appreciated; no murmurs, no clicks, no rubs, no gallops RESP: Normal chest excursion without splinting or tachypnea; breath sounds clear and equal bilaterally; no wheezes, no rhonchi, no rales, no hypoxia or respiratory distress, speaking full sentences ABD/GI: Normal bowel sounds; non-distended; soft, non-tender, no rebound, no guarding, no peritoneal signs, no hepatosplenomegaly BACK:  The back appears normal and is non-tender to palpation, there is no CVA tenderness EXT: Normal ROM in all joints; non-tender to palpation; no edema; normal capillary refill; no cyanosis, no calf tenderness or swelling    SKIN: Normal color for age and race; warm; no rash NEURO: Moves all extremities equally sensation to light touch intact diffusely, cranial nerves II through XII intact, speech is difficult to understand which is his baseline with no dysarthria or aphasia  PSYCH: The patient's mood and manner are appropriate.  Poor hygiene.  MEDICAL DECISION MAKING: Patient here with hypertension.  He has been seen in the emergency department  several times for the same.  Suspect related to medical noncompliance.  Will give him his home medications and a dose of IV hydralazine in the emergency department.  Will obtain labs, urine, EKG.  Abdominal exam benign.  I do not feel he needs repeat imaging.  Doubt stroke.  Doubt dissection given recent normal CT scan.  Doubt intracranial hemorrhage.  No chest pain or shortness of breath to suggest ACS.  Will give GI cocktail for his burning pain.  His lightheadedness is chronic for him and listed in his medical history.  ED PROGRESS: Patient's labs show hemoglobin of 8.5 which is slightly down from his previous.  Patient refuses rectal exam.  He denies bloody stool or melena.  He has normal creatinine and BUN.  I recommended close follow-up with her primary care physician for this.  Otherwise labs, urine unremarkable.  EKG shows no new ischemic abnormality.  Blood pressure improving with  home medications.    Blood pressure continues to improve.  I feel patient is safe to be discharged home.  Will refill his hydralazine as he is out of this medication.  Have encouraged medical compliance.   At this time, I do not feel there is any life-threatening condition present. I have reviewed and discussed all results (EKG, imaging, lab, urine as appropriate) and exam findings with patient/family. I have reviewed nursing notes and appropriate previous records.  I feel the patient is safe to be discharged home without further emergent workup and can continue workup as an outpatient as needed. Discussed usual and customary return precautions. Patient/family verbalize understanding and are comfortable with this plan.  Outpatient follow-up has been provided if needed. All questions have been answered.    EKG Interpretation  Date/Time:  Saturday March 14 2017 00:28:10 EST Ventricular Rate:  71 PR Interval:    QRS Duration: 87 QT Interval:  421 QTC Calculation: 458 R Axis:   88 Text Interpretation:  Sinus  rhythm Left ventricular hypertrophy ST elevation, consider anterior injury No significant change since last tracing Confirmed by Ward, Baxter HireKristen [phone]) on 03/14/2017 12:46:00 AM         Ward, Layla MawKristen N, DO 03/14/17 [id number]

## 2017-03-14 NOTE — Discharge Instructions (Signed)
Please start taking all 3 of your blood pressure medications as prescribed.

## 2017-03-14 NOTE — ED Notes (Signed)
Pt refused to sign disposition/discharge. Pt was extremely verbally aggresive to this nurse and other staff. Pt was angry that we dont provide transportation back home and proceeded to rip everything he could up in the room and throw it. Pt ripped arm out of this nurse hand when taking out IV and used profound language

## 2017-04-19 ENCOUNTER — Emergency Department (HOSPITAL_COMMUNITY): Payer: Medicaid Other

## 2017-04-19 ENCOUNTER — Inpatient Hospital Stay (HOSPITAL_COMMUNITY)
Admission: EM | Admit: 2017-04-19 | Discharge: 2017-04-21 | DRG: 291 | Discharge status: Against Medical Advice | Payer: Medicaid Other | Attending: Internal Medicine | Admitting: Internal Medicine

## 2017-04-19 ENCOUNTER — Encounter (HOSPITAL_COMMUNITY): Payer: Self-pay

## 2017-04-19 DIAGNOSIS — R0682 Tachypnea, not elsewhere classified: Secondary | ICD-10-CM

## 2017-04-19 DIAGNOSIS — I10 Essential (primary) hypertension: Secondary | ICD-10-CM | POA: Diagnosis not present

## 2017-04-19 DIAGNOSIS — R0602 Shortness of breath: Secondary | ICD-10-CM | POA: Diagnosis not present

## 2017-04-19 DIAGNOSIS — Y92009 Unspecified place in unspecified non-institutional (private) residence as the place of occurrence of the external cause: Secondary | ICD-10-CM

## 2017-04-19 DIAGNOSIS — Z833 Family history of diabetes mellitus: Secondary | ICD-10-CM

## 2017-04-19 DIAGNOSIS — I5033 Acute on chronic diastolic (congestive) heart failure: Secondary | ICD-10-CM | POA: Diagnosis present

## 2017-04-19 DIAGNOSIS — F1020 Alcohol dependence, uncomplicated: Secondary | ICD-10-CM | POA: Diagnosis present

## 2017-04-19 DIAGNOSIS — T465X6A Underdosing of other antihypertensive drugs, initial encounter: Secondary | ICD-10-CM | POA: Diagnosis present

## 2017-04-19 DIAGNOSIS — I130 Hypertensive heart and chronic kidney disease with heart failure and stage 1 through stage 4 chronic kidney disease, or unspecified chronic kidney disease: Principal | ICD-10-CM | POA: Diagnosis present

## 2017-04-19 DIAGNOSIS — R42 Dizziness and giddiness: Secondary | ICD-10-CM | POA: Diagnosis present

## 2017-04-19 DIAGNOSIS — F17210 Nicotine dependence, cigarettes, uncomplicated: Secondary | ICD-10-CM | POA: Diagnosis present

## 2017-04-19 DIAGNOSIS — R062 Wheezing: Secondary | ICD-10-CM

## 2017-04-19 DIAGNOSIS — I5031 Acute diastolic (congestive) heart failure: Secondary | ICD-10-CM | POA: Diagnosis present

## 2017-04-19 DIAGNOSIS — J441 Chronic obstructive pulmonary disease with (acute) exacerbation: Secondary | ICD-10-CM | POA: Diagnosis present

## 2017-04-19 DIAGNOSIS — R9431 Abnormal electrocardiogram [ECG] [EKG]: Secondary | ICD-10-CM | POA: Diagnosis present

## 2017-04-19 DIAGNOSIS — E876 Hypokalemia: Secondary | ICD-10-CM | POA: Diagnosis not present

## 2017-04-19 DIAGNOSIS — R109 Unspecified abdominal pain: Secondary | ICD-10-CM | POA: Diagnosis present

## 2017-04-19 DIAGNOSIS — N179 Acute kidney failure, unspecified: Secondary | ICD-10-CM | POA: Diagnosis not present

## 2017-04-19 DIAGNOSIS — T501X5A Adverse effect of loop [high-ceiling] diuretics, initial encounter: Secondary | ICD-10-CM | POA: Diagnosis present

## 2017-04-19 DIAGNOSIS — N183 Chronic kidney disease, stage 3 (moderate): Secondary | ICD-10-CM | POA: Diagnosis present

## 2017-04-19 DIAGNOSIS — D509 Iron deficiency anemia, unspecified: Secondary | ICD-10-CM | POA: Diagnosis present

## 2017-04-19 DIAGNOSIS — F1010 Alcohol abuse, uncomplicated: Secondary | ICD-10-CM | POA: Diagnosis present

## 2017-04-19 DIAGNOSIS — I160 Hypertensive urgency: Secondary | ICD-10-CM | POA: Diagnosis present

## 2017-04-19 LAB — CBC WITH DIFFERENTIAL/PLATELET
BASOS PCT: 1 %
Basophils Absolute: 0 10*3/uL (ref 0.0–0.1)
EOS ABS: 0.1 10*3/uL (ref 0.0–0.7)
Eosinophils Relative: 4 %
HCT: 29.7 % — ABNORMAL LOW (ref 39.0–52.0)
HEMOGLOBIN: 9.6 g/dL — AB (ref 13.0–17.0)
LYMPHS ABS: 1.6 10*3/uL (ref 0.7–4.0)
Lymphocytes Relative: 44 %
MCH: 24.2 pg — AB (ref 26.0–34.0)
MCHC: 32.3 g/dL (ref 30.0–36.0)
MCV: 75 fL — ABNORMAL LOW (ref 78.0–100.0)
Monocytes Absolute: 0.5 10*3/uL (ref 0.1–1.0)
Monocytes Relative: 13 %
NEUTROS PCT: 40 %
Neutro Abs: 1.4 10*3/uL — ABNORMAL LOW (ref 1.7–7.7)
Platelets: 316 10*3/uL (ref 150–400)
RBC: 3.96 MIL/uL — AB (ref 4.22–5.81)
RDW: 19.9 % — ABNORMAL HIGH (ref 11.5–15.5)
WBC: 3.6 10*3/uL — AB (ref 4.0–10.5)

## 2017-04-19 LAB — CBC
HCT: 30.1 % — ABNORMAL LOW (ref 39.0–52.0)
Hemoglobin: 9.6 g/dL — ABNORMAL LOW (ref 13.0–17.0)
MCH: 23.8 pg — ABNORMAL LOW (ref 26.0–34.0)
MCHC: 31.9 g/dL (ref 30.0–36.0)
MCV: 74.5 fL — AB (ref 78.0–100.0)
PLATELETS: 341 10*3/uL (ref 150–400)
RBC: 4.04 MIL/uL — AB (ref 4.22–5.81)
RDW: 20.2 % — AB (ref 11.5–15.5)
WBC: 3.4 10*3/uL — AB (ref 4.0–10.5)

## 2017-04-19 LAB — BLOOD GAS, ARTERIAL
ACID-BASE DEFICIT: 1 mmol/L (ref 0.0–2.0)
Bicarbonate: 22.5 mmol/L (ref 20.0–28.0)
DRAWN BY: 441971
FIO2: 0.21
O2 SAT: 95.2 %
PATIENT TEMPERATURE: 98.6
PCO2 ART: 32.8 mmHg (ref 32.0–48.0)
pH, Arterial: 7.45 (ref 7.350–7.450)
pO2, Arterial: 79.7 mmHg — ABNORMAL LOW (ref 83.0–108.0)

## 2017-04-19 LAB — METHEMOGLOBIN: Methemoglobin: 1.2 % (ref 0.0–1.5)

## 2017-04-19 LAB — APTT: APTT: 36 s (ref 24–36)

## 2017-04-19 LAB — TSH: TSH: 0.749 u[IU]/mL (ref 0.350–4.500)

## 2017-04-19 LAB — COMPREHENSIVE METABOLIC PANEL
ALBUMIN: 3.2 g/dL — AB (ref 3.5–5.0)
ALK PHOS: 76 U/L (ref 38–126)
ALT: 58 U/L (ref 17–63)
ANION GAP: 9 (ref 5–15)
AST: 72 U/L — ABNORMAL HIGH (ref 15–41)
BUN: 10 mg/dL (ref 6–20)
CO2: 21 mmol/L — AB (ref 22–32)
Calcium: 8.7 mg/dL — ABNORMAL LOW (ref 8.9–10.3)
Chloride: 105 mmol/L (ref 101–111)
Creatinine, Ser: 1.07 mg/dL (ref 0.61–1.24)
GFR calc non Af Amer: >60 mL/min (ref >60)
GLUCOSE: 89 mg/dL (ref 65–99)
Potassium: 3.3 mmol/L — ABNORMAL LOW (ref 3.5–5.1)
SODIUM: 135 mmol/L (ref 135–145)
Total Bilirubin: 0.5 mg/dL (ref 0.3–1.2)
Total Protein: 7.4 g/dL (ref 6.5–8.1)

## 2017-04-19 LAB — ETHANOL: Alcohol, Ethyl (B): <10 mg/dL (ref <10)

## 2017-04-19 LAB — CREATININE, SERUM
CREATININE: 1.25 mg/dL — AB (ref 0.61–1.24)
GFR calc non Af Amer: >60 mL/min (ref >60)

## 2017-04-19 LAB — PROTIME-INR
INR: 1.08
PROTHROMBIN TIME: 14 s (ref 11.4–15.2)

## 2017-04-19 LAB — I-STAT TROPONIN, ED: TROPONIN I, POC: 0.02 ng/mL (ref 0.00–0.08)

## 2017-04-19 LAB — INFLUENZA PANEL BY PCR (TYPE A & B)
Influenza A By PCR: NEGATIVE
Influenza B By PCR: NEGATIVE

## 2017-04-19 LAB — MAGNESIUM: Magnesium: 2.1 mg/dL (ref 1.7–2.4)

## 2017-04-19 LAB — CARBOXYHEMOGLOBIN - COOX: CARBOXYHEMOGLOBIN: 1.5 % (ref 0.5–1.5)

## 2017-04-19 LAB — LIPASE, BLOOD: Lipase: 38 U/L (ref 11–51)

## 2017-04-19 MED ORDER — IPRATROPIUM-ALBUTEROL 0.5-2.5 (3) MG/3ML IN SOLN
3.0000 mL | Freq: Four times a day (QID) | RESPIRATORY_TRACT | Status: DC
  Administered 2017-04-19: 3 mL via RESPIRATORY_TRACT
  Filled 2017-04-19: qty 3

## 2017-04-19 MED ORDER — ALBUTEROL SULFATE (2.5 MG/3ML) 0.083% IN NEBU
5.0000 mg | INHALATION_SOLUTION | Freq: Once | RESPIRATORY_TRACT | Status: AC
  Administered 2017-04-19: 5 mg via RESPIRATORY_TRACT
  Filled 2017-04-19: qty 6

## 2017-04-19 MED ORDER — CLONIDINE HCL 0.1 MG PO TABS: 0.1000 mg | ORAL_TABLET | Freq: Two times a day (BID) | ORAL | Status: DC

## 2017-04-19 MED ORDER — ACETAMINOPHEN 650 MG RE SUPP: 650.0000 mg | Freq: Four times a day (QID) | RECTAL | Status: DC | PRN

## 2017-04-19 MED ORDER — HYDRALAZINE HCL 25 MG PO TABS
25.0000 mg | ORAL_TABLET | Freq: Three times a day (TID) | ORAL | Status: DC
  Administered 2017-04-19 – 2017-04-20 (×3): 25 mg via ORAL
  Filled 2017-04-19 (×3): qty 1

## 2017-04-19 MED ORDER — ONDANSETRON 4 MG PO TBDP: 4.0000 mg | ORAL_TABLET | Freq: Three times a day (TID) | ORAL | Status: DC | PRN

## 2017-04-19 MED ORDER — PREDNISONE 20 MG PO TABS
40.0000 mg | ORAL_TABLET | Freq: Every day | ORAL | Status: DC
  Filled 2017-04-19: qty 2

## 2017-04-19 MED ORDER — MAGNESIUM SULFATE 2 GM/50ML IV SOLN
2.0000 g | Freq: Once | INTRAVENOUS | Status: AC
  Administered 2017-04-19: 2 g via INTRAVENOUS
  Filled 2017-04-19: qty 50

## 2017-04-19 MED ORDER — CARVEDILOL 25 MG PO TABS
25.0000 mg | ORAL_TABLET | Freq: Two times a day (BID) | ORAL | Status: DC
  Administered 2017-04-19 – 2017-04-22 (×7): 25 mg via ORAL
  Filled 2017-04-19 (×4): qty 1
  Filled 2017-04-19: qty 2
  Filled 2017-04-19 (×4): qty 1

## 2017-04-19 MED ORDER — SUCRALFATE 1 G PO TABS
1.0000 g | ORAL_TABLET | Freq: Once | ORAL | Status: AC
  Administered 2017-04-19: 1 g via ORAL
  Filled 2017-04-19: qty 1

## 2017-04-19 MED ORDER — PANTOPRAZOLE SODIUM 40 MG PO TBEC
40.0000 mg | DELAYED_RELEASE_TABLET | Freq: Once | ORAL | Status: AC
  Administered 2017-04-19: 40 mg via ORAL
  Filled 2017-04-19: qty 1

## 2017-04-19 MED ORDER — IPRATROPIUM BROMIDE 0.02 % IN SOLN
0.5000 mg | Freq: Once | RESPIRATORY_TRACT | Status: AC
  Administered 2017-04-19: 0.5 mg via RESPIRATORY_TRACT
  Filled 2017-04-19: qty 2.5

## 2017-04-19 MED ORDER — BISACODYL 10 MG RE SUPP
10.0000 mg | Freq: Once | RECTAL | Status: AC
  Administered 2017-04-21: 10 mg via RECTAL
  Filled 2017-04-19: qty 1

## 2017-04-19 MED ORDER — POLYETHYLENE GLYCOL 3350 17 G PO PACK
17.0000 g | PACK | Freq: Every day | ORAL | Status: DC | PRN
  Administered 2017-04-19 – 2017-04-21 (×2): 17 g via ORAL
  Filled 2017-04-19 (×2): qty 1

## 2017-04-19 MED ORDER — HYDRALAZINE HCL 20 MG/ML IJ SOLN
10.0000 mg | INTRAMUSCULAR | Status: AC
  Administered 2017-04-19: 10 mg via INTRAVENOUS
  Filled 2017-04-19: qty 1

## 2017-04-19 MED ORDER — CARVEDILOL 12.5 MG PO TABS: 25.0000 mg | ORAL_TABLET | Freq: Two times a day (BID) | ORAL | Status: DC

## 2017-04-19 MED ORDER — THIAMINE HCL 100 MG/ML IJ SOLN
Freq: Once | INTRAVENOUS | Status: AC
  Administered 2017-04-19: 15:00:00 via INTRAVENOUS
  Filled 2017-04-19: qty 1000

## 2017-04-19 MED ORDER — PANTOPRAZOLE SODIUM 40 MG PO TBEC
40.0000 mg | DELAYED_RELEASE_TABLET | Freq: Every day | ORAL | Status: DC
  Administered 2017-04-20 – 2017-04-22 (×3): 40 mg via ORAL
  Filled 2017-04-19 (×3): qty 1

## 2017-04-19 MED ORDER — POTASSIUM CHLORIDE IN NACL 40-0.9 MEQ/L-% IV SOLN
INTRAVENOUS | Status: DC
  Administered 2017-04-19: 150 mL/h via INTRAVENOUS
  Filled 2017-04-19 (×2): qty 1000

## 2017-04-19 MED ORDER — CLONIDINE HCL 0.2 MG PO TABS
0.3000 mg | ORAL_TABLET | Freq: Two times a day (BID) | ORAL | Status: DC
  Administered 2017-04-19 – 2017-04-20 (×3): 0.3 mg via ORAL
  Filled 2017-04-19 (×4): qty 1

## 2017-04-19 MED ORDER — ENOXAPARIN SODIUM 40 MG/0.4ML ~~LOC~~ SOLN
40.0000 mg | SUBCUTANEOUS | Status: DC
  Administered 2017-04-19 – 2017-04-22 (×4): 40 mg via SUBCUTANEOUS
  Filled 2017-04-19 (×4): qty 0.4

## 2017-04-19 MED ORDER — METHYLPREDNISOLONE SODIUM SUCC 125 MG IJ SOLR
125.0000 mg | Freq: Once | INTRAMUSCULAR | Status: AC
  Administered 2017-04-19: 125 mg via INTRAVENOUS
  Filled 2017-04-19: qty 2

## 2017-04-19 MED ORDER — ACETAMINOPHEN 325 MG PO TABS
650.0000 mg | ORAL_TABLET | Freq: Four times a day (QID) | ORAL | Status: DC | PRN
  Administered 2017-04-19 – 2017-04-21 (×4): 650 mg via ORAL
  Filled 2017-04-19 (×4): qty 2

## 2017-04-19 MED ORDER — ZOLPIDEM TARTRATE 5 MG PO TABS
5.0000 mg | ORAL_TABLET | Freq: Every day | ORAL | Status: AC
  Administered 2017-04-19: 5 mg via ORAL
  Filled 2017-04-19: qty 1

## 2017-04-19 MED ORDER — HYDRALAZINE HCL 25 MG PO TABS
25.0000 mg | ORAL_TABLET | Freq: Once | ORAL | Status: AC
  Administered 2017-04-19: 25 mg via ORAL
  Filled 2017-04-19: qty 1

## 2017-04-19 MED ORDER — ALBUTEROL SULFATE (2.5 MG/3ML) 0.083% IN NEBU
2.5000 mg | INHALATION_SOLUTION | RESPIRATORY_TRACT | Status: DC | PRN
  Administered 2017-04-21: 2.5 mg via RESPIRATORY_TRACT
  Filled 2017-04-19: qty 3

## 2017-04-19 NOTE — Progress Notes (Signed)
Patient states he needs a suppository and something to help him sleep tonight. On call MD paged, awaiting response.

## 2017-04-19 NOTE — ED Triage Notes (Signed)
Pt from home with ems c.o "not feeling well" for the past 2 days. Denies fever. Pt wheezing upon assessment, EMS administered a breathing treatment en route. Pt also hypertensive 235/131 pt reports compliance with all medications. Pt a.o upon arrival, HR sinus rhythm 70bpm, ems reports pts sat well on room air.

## 2017-04-19 NOTE — H&P (Signed)
History and Physical:    Austin Mendez   WUJ:[id number] DOB: May 29, 1960 DOA: 04/19/2017  Referring MD/provider: PA Muthersbaugh PCP: [name], Shiv, MD   Patient coming from: Home  Chief Complaint: "I feel sick, I been sick"  History of Present Illness:   Austin Mendez is an 57 y.o. male with past medical history significant for poorly controlled hypertension, poor functional status, noncompliance with medication, alcohol use who was in his usual state of poor health until 2 days ago when he noted onset of increasing shortness of breath. Patient notes he does not have any inhalers and has never been admitted for COPD so waited a while before coming into the hospital. Patient also complains of dizziness and some abdominal pain with nausea but no vomiting. Patient denies fevers or sweats but does admit to feeling chilly over the past 2 weeks. Patient denies cough, hemoptysis, hematemesis, diarrhea or decreased by mouth intake. Patient does admit to some generalized dizziness when he is trying to move about. Denies any chest pain or palpitations. Denies any syncope or presyncope.  Patient admits to a 30-40-pack-year tobacco history but notes he's tried to cut down. Patient also states that he only drinks 1 beer a day and no more than that. Patient states that at baseline he and his wife who is also disabled live together, she is in charge of shopping and cooking but has not done that for a while. Patient states that she has been feeling unwell for a couple of weeks.  Patient states he has been using a kerosene heater at home for the past several weeks. Notes he cannot afford gas  ED Course:  The patient was noted to be wheezing and short of breath but O2 sats remained greater than 95% throughout off oxygen. He was treated with Solu-Medrol and inhaled bronchodilators. He was also noted be markedly hypertensive and admitted to not taking his antihypertensive medications lately. He was  treated with IV hydralazine and resumption of his by mouth meds with improvement of blood pressure.  ROS:   ROS   Review of Systems: Skin: No rashes, lesions, wounds Eyes: no discharge, redness, pain HENT: no ear pain, hearing loss, drainage, tinnitus Respiratory: Positive cough,, positive shortness of breath, negative hemoptysis Cardiovascular: No palpitations, chest pain GI: Positive nausea, negative vomiting, diarrhea, constipation GU: No dysuria, increased frequency Blood/lymphatics: No easy bruising, bleeding   Past Medical History:   Past Medical History:  Diagnosis Date  . Dizziness and giddiness   . Hypertension   . Shortness of breath   . Syncope and collapse   . Unspecified essential hypertension     Past Surgical History:   No past surgical history on file.  Social History:   Social History   Socioeconomic History  . Marital status: Married    Spouse name: Not on file  . Number of children: Not on file  . Years of education: Not on file  . Highest education level: Not on file  Social Needs  . Financial resource strain: Not on file  . Food insecurity - worry: Not on file  . Food insecurity - inability: Not on file  . Transportation needs - medical: Not on file  . Transportation needs - non-medical: Not on file  Occupational History  . Not on file  Tobacco Use  . Smoking status: Current Every Day Smoker    Packs/day: 10.00    Types: Cigarettes  . Smokeless tobacco: Never Used  Substance and Sexual Activity  .  Alcohol use: Yes    Alcohol/week: 0.0 oz    Comment: Smells of ETOH  St's just stopped drinking  . Drug use: Yes    Types: Marijuana  . Sexual activity: No    Birth control/protection: None    Comment: St's stopped 2 yrs ago  Other Topics Concern  . Not on file  Social History Narrative  . Not on file    Allergies   Patient has no known allergies.  Family history:   Family History  Problem Relation Age of Onset  . Cancer  Mother   . Diabetes Father   . Hypertension Unknown     Current Medications:   Prior to Admission medications   Medication Sig Start Date End Date Taking? Authorizing Provider  carvedilol (COREG) 12.5 MG tablet Take 1 tablet (12.5 mg total) by mouth 2 (two) times daily with a meal. Patient taking differently: Take 25 mg by mouth 2 (two) times daily with a meal.  10/16/16  Yes Lavera Guise, MD  cloNIDine (CATAPRES) 0.2 MG tablet Take 0.5 tablets (0.1 mg total) by mouth 2 (two) times daily. Patient taking differently: Take 0.3 mg by mouth 3 (three) times daily.  10/16/16  Yes Lavera Guise, MD  hydrALAZINE (APRESOLINE) 25 MG tablet Take 1 tablet (25 mg total) by mouth every 8 (eight) hours. 03/14/17  Yes Ward, Baxter Hire N, DO  pantoprazole (PROTONIX) 40 MG tablet Take 1 tablet (40 mg total) by mouth daily. 03/14/17  Yes Ward, Kristen N, DO  ondansetron (ZOFRAN ODT) 4 MG disintegrating tablet Take 1 tablet (4 mg total) by mouth every 8 (eight) hours as needed for nausea or vomiting. Patient not taking: Reported on 04/19/2017 03/14/17   Ward, Layla Maw, DO    Physical Exam:   Vitals:   04/19/17 1000 04/19/17 1045 04/19/17 1130 04/19/17 1226  BP: (!) 174/94 (!) 170/91 (!) 182/93   Pulse: 64 62 66 78  Resp: 19 12 16 18   Temp:   97.8 F (36.6 C)   TempSrc:   Oral   SpO2: 96% 96% 100% 98%  Weight:   70.5 kg (155 lb 6.8 oz)   Height:   5\' 6"  (1.676 m)      Physical Exam: Blood pressure (!) 182/93, pulse 78, temperature 97.8 F (36.6 C), temperature source Oral, resp. rate 18, height 5\' 6"  (1.676 m), weight 70.5 kg (155 lb 6.8 oz), SpO2 98 %. Gen: Chronically ill-appearing man appearing much older than stated age lying in bed at 20 in no respiratory distress. He should complain of being short of breath when he was talking and did seem to need to take breaths midsentence. However stopped O2 sats remained 95-98% on room air. Eyes: Muddy brown sclera. Conjunctiva mildly injected. Oropharynx:  Exceptionally poor dentition with multiple missing teeth, dry mucous membranes Neck: Supple, no jugular venous distention. Chest: Poor air entry bilaterally with rare low pitched expiratory wheeze in right lung.  CV: Distant, regular, no audible murmurs. Abdomen: NABS, soft, slightly distended, firm but not hard, nontender. No tenderness to light or deep palpation. No rebound, no guarding. Extremities: No edema.  Skin: Warm and dry. No rashes, lesions or wounds. Neuro: Alert and oriented times 3. It is difficult to assess patient neurologically as he appears to have a significant speech impediment with slow speech and some miss pronounced words. No evidence of receptive or expressive aphasia. Moving all his extremities equally. Psych: Patient is cooperative and coherent.  Data Review:  Labs: Basic Metabolic Panel: Recent Labs  Lab 04/19/17 0236  NA 135  K 3.3*  CL 105  CO2 21*  GLUCOSE 89  BUN 10  CREATININE 1.07  CALCIUM 8.7*   Liver Function Tests: Recent Labs  Lab 04/19/17 0236  AST 72*  ALT 58  ALKPHOS 76  BILITOT 0.5  PROT 7.4  ALBUMIN 3.2*   Recent Labs  Lab 04/19/17 0236  LIPASE 38   No results for input(s): AMMONIA in the last 168 hours. CBC: Recent Labs  Lab 04/19/17 0236 04/19/17 1145  WBC 3.6* 3.4*  NEUTROABS 1.4*  --   HGB 9.6* 9.6*  HCT 29.7* 30.1*  MCV 75.0* 74.5*  PLT 316 341   Cardiac Enzymes: No results for input(s): CKTOTAL, CKMB, CKMBINDEX, TROPONINI in the last 168 hours.  BNP (last 3 results) No results for input(s): PROBNP in the last 8760 hours. CBG: No results for input(s): GLUCAP in the last 168 hours.  Urinalysis    Component Value Date/Time   COLORURINE YELLOW 03/14/2017 0035   APPEARANCEUR CLEAR 03/14/2017 0035   LABSPEC 1.008 03/14/2017 0035   PHURINE 5.0 03/14/2017 0035   GLUCOSEU NEGATIVE 03/14/2017 0035   HGBUR NEGATIVE 03/14/2017 0035   BILIRUBINUR NEGATIVE 03/14/2017 0035   KETONESUR NEGATIVE 03/14/2017 0035    PROTEINUR NEGATIVE 03/14/2017 0035   UROBILINOGEN 2.0 (H) 04/28/2013 1602   NITRITE NEGATIVE 03/14/2017 0035   LEUKOCYTESUR NEGATIVE 03/14/2017 0035      Radiographic Studies: Ct Head Wo Contrast  Result Date: 04/19/2017 CLINICAL DATA:  Dizziness EXAM: CT HEAD WITHOUT CONTRAST TECHNIQUE: Contiguous axial images were obtained from the base of the skull through the vertex without intravenous contrast. COMPARISON:  02/28/2017 FINDINGS: Brain: Chronic atrophic and ischemic changes are again identified and stable. No findings to suggest acute hemorrhage, acute infarction or space-occupying mass lesion are noted. Vascular: No hyperdense vessel or unexpected calcification. Skull: Normal. Negative for fracture or focal lesion. Sinuses/Orbits: No acute finding. Other: None. IMPRESSION: Chronic atrophic and ischemic changes.  No acute abnormality noted. Electronically Signed   By: Alcide Clever M.D.   On: 04/19/2017 08:44   Dg Chest Portable 1 View  Result Date: 04/19/2017 CLINICAL DATA:  Pain dizziness and elevated BP EXAM: PORTABLE CHEST 1 VIEW COMPARISON:  CT chest 02/28/2017, radiograph 10/06/2015 FINDINGS: The heart size and mediastinal contours are within normal limits. Aortic atherosclerosis. Both lungs are clear. The visualized skeletal structures are unremarkable. IMPRESSION: No active disease. Electronically Signed   By: Jasmine Pang M.D.   On: 04/19/2017 02:23    EKG: Independently reviewed.  Sinus rhythm at 60, slight scooping of ST segment without significant depression in 2, has J-point elevation and U waves precordially no acute changes.   Assessment/Plan:   Active Problems:   Alcohol abuse   Essential hypertension   Hypokalemia   SOB (shortness of breath)   Tachypnea   SOB Patient with 50-pack-year tobacco history with 2 day shortness of breath without associated fevers or cough, although he has felt chilly for 2 weeks. Associated malaise and dizziness and intermittent  nausea without vomiting was concerning for possible carbon monoxide poisoning especially given use of kerosene heater for the past several weeks. However carb oxyhemoglobin and methemoglobin levels are within normal limits.  Influenza PCR is been sent and is pending. Would start Tamiflu if that were to come back positive.  I suspect patient is having his first COPD flare. He seems to have responded well to steroids and inhaled bronchodilators which I  will continue. Will not add antibiotics is present as patient does not have a cough or change in previously existent cough. Of note patient has remained with O2 sats greater than 95% on room air throughout, he has not needed oxygen.  HTN Patient was quite hypertensive on admission although his blood pressure is much improved since his home medications have been restarted. I am continuing his Coreg, hydralazine and clonidine although patient states he is taking his clonidine 0.3 3 times a day, but I have restarted it at 0.1 twice a day which is the dose that he is apparently supposed to be taking will need to titrate that up aggressively if in fact blood pressures right significantly again. Clonidine may not be the best drug for him given his no noncompliance. Apparently was previously on amlodipine which was discontinued at last admission for unclear reasons. May consider restarting amlodipine.  HYPOKALEMIA Patient received potassium in the ER, will check a magnesium, we'll provide IV fluids with 40 of K and recheck in a.m.  ETOH Patient states he only drinks 1 beer a day. Denies history of complicated withdrawal. We'll follow clinically.  ABNORMAL EKG Early repolarization w/o change  ANEMIA  Patient with chronic anemia likely multifactorial, anemia workup can be done as an outpatient.   Other information:   DVT prophylaxis: Lovenox ordered. Code Status: Full code. Family Communication: Patient states his wife knows that he is in the hospital    Disposition Plan: Home Consults called: None Admission status: Observation    Pieter Partridge [facility] Pager [phone] Cell: [phone]   If 7PM-7AM, please contact night-coverage [email or web address] Password [facility] 04/19/2017, 12:49 PM

## 2017-04-19 NOTE — ED Notes (Signed)
Patient transported to CT 

## 2017-04-19 NOTE — ED Provider Notes (Signed)
Care assumed from previous provider PA Muthersbaugh. Please see their note for further details to include full history and physical. To summarize in short pt is a 57 year old male history of hypertension, EtOH abuse, tobacco abuse that presents to the emergency department today with complaints of dyspnea and wheezing.  Patient had significant wheezing and rhonchi on exam peer was given several duo nebs, magnesium, Solu-Medrol with persistent wheezing and dyspnea.  Patient satting at 92-96% on room air.  He reports dyspnea at this time.  Prior provider tried to admit patient last night however on-call hospitalist wanted patient's blood pressure under control before admission.  He also recommend that we obtain CT scan of head.  CT scan of head was performed that was unremarkable.  Patient's blood pressure has been managed with hydralazine his oral hypertensive medications.  Last blood pressure 153/83.Marland Kitchen. Case discussed, plan agreed upon.  Prior provider Y patient admitted for COPD versus asthma exacerbation.  I spoke with Dr. Greggory Brandyhatergee with hospital medicine who agrees to admission.  Would like repeat EKG.  Patient remains hemodynamically stable at this time on room air however he does report some dyspnea with exertion.       Austin Mendez, Austin Hidalgo T, PA-C 04/19/17 0920    Austin Mendez, Douglas, MD 04/22/17 2259

## 2017-04-19 NOTE — ED Notes (Signed)
Attempted to call report to [address] [place]. Spoke with Wenda Lowarly, RN. Not able to take report at this time. Will call back for report.

## 2017-04-19 NOTE — ED Provider Notes (Signed)
[facility] EMERGENCY DEPARTMENT Provider Note   CSN: [id number] Arrival date & time: 04/19/17  0134     History   Chief Complaint Chief Complaint  Patient presents with  . Hypertension  . Wheezing  . Nausea    HPI CARMELLO CABINESS is a 57 y.o. male with a hx of HTN, EtOH abuse presents to the Emergency Department complaining of gradual, persistent, progressively worsening SOB onset yesterday.  Pt reports no treatments PTA.  Pt denies fever, chest pain, syncope, leg swelling, palpitations. Associated symptoms include DOE and wheezing but no orthopnea.  Nothing seems to makes it better.  Pt denies cardiac problems.  Pt reports he is a current smoker.  Pt reports he has been compliant with his meds, but is out of one of them and has been cutting one in half to make it last longer.  He is not sure which one is which.     Pt also c/o abd pain onset yesterday.  Pt reports pain is located in the central abd.  Pt reports nausea and "gagging" but no vomiting.  Pt reports he is still drinking alcohol.  He reports drinking a can of beer tonight.  He denies melena or hematochezia.    The history is provided by the patient and medical records. No language interpreter was used.    Past Medical History:  Diagnosis Date  . Dizziness and giddiness   . Hypertension   . Shortness of breath   . Syncope and collapse   . Unspecified essential hypertension     Patient Active Problem List   Diagnosis Date Noted  . Orthostatic dizziness   . Altered mental status   . AKI (acute kidney injury) (HCC)   . Uncontrolled hypertension   . Noncompliance with treatment   . Tobacco abuse   . Hypomagnesemia 09/07/2015  . Epigastric pain 09/07/2015  . ETOH abuse 09/07/2015  . Near syncope   . Medical non-compliance   . 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 Syncope and collapse 08/02/2012  . Facial fracture due to fall (HCC) 08/02/2012  . Hypokalemia 08/02/2012  . Acute kidney  injury (HCC) 08/02/2012  . Syncope 08/02/2012  . Alcohol abuse 04/20/2006  . TOBACCO ABUSE 04/20/2006  . Essential hypertension 04/20/2006    No past surgical history on file.     Home Medications    Prior to Admission medications   Medication Sig Start Date End Date Taking? Authorizing Provider  carvedilol (COREG) 12.5 MG tablet Take 1 tablet (12.5 mg total) by mouth 2 (two) times daily with a meal. Patient taking differently: Take 25 mg by mouth 2 (two) times daily with a meal.  10/16/16  Yes Lavera Guise, MD  cloNIDine (CATAPRES) 0.2 MG tablet Take 0.5 tablets (0.1 mg total) by mouth 2 (two) times daily. Patient taking differently: Take 0.3 mg by mouth 3 (three) times daily.  10/16/16  Yes Lavera Guise, MD  hydrALAZINE (APRESOLINE) 25 MG tablet Take 1 tablet (25 mg total) by mouth every 8 (eight) hours. 03/14/17  Yes Ward, Baxter Hire N, DO  pantoprazole (PROTONIX) 40 MG tablet Take 1 tablet (40 mg total) by mouth daily. 03/14/17  Yes Ward, Kristen N, DO  ondansetron (ZOFRAN ODT) 4 MG disintegrating tablet Take 1 tablet (4 mg total) by mouth every 8 (eight) hours as needed for nausea or vomiting. Patient not taking: Reported on 04/19/2017 03/14/17   Ward, Layla Maw, DO    Family History Family History  Problem Relation  Age of Onset  . Cancer Mother   . Diabetes Father   . Hypertension Unknown     Social History Social History   Tobacco Use  . Smoking status: Current Every Day Smoker    Packs/day: 10.00    Types: Cigarettes  . Smokeless tobacco: Never Used  Substance Use Topics  . Alcohol use: Yes    Alcohol/week: 0.0 oz    Comment: Smells of ETOH  St's just stopped drinking  . Drug use: Yes    Types: Marijuana     Allergies   Patient has no known allergies.   Review of Systems Review of Systems  Constitutional: Negative for appetite change, diaphoresis, fatigue, fever and unexpected weight change.  HENT: Negative for mouth sores.   Eyes: Negative for visual  disturbance.  Respiratory: Positive for cough, chest tightness, shortness of breath and wheezing.   Cardiovascular: Negative for chest pain.  Gastrointestinal: Positive for abdominal pain ( Generalized). Negative for constipation, diarrhea, nausea and vomiting.  Endocrine: Negative for polydipsia, polyphagia and polyuria.  Genitourinary: Negative for dysuria, frequency, hematuria and urgency.  Musculoskeletal: Negative for back pain and neck stiffness.  Skin: Negative for rash.  Allergic/Immunologic: Negative for immunocompromised state.  Neurological: Negative for syncope, light-headedness and headaches.  Hematological: Does not bruise/bleed easily.  Psychiatric/Behavioral: Negative for sleep disturbance. The patient is not nervous/anxious.      Physical Exam Updated Vital Signs BP (!) 196/123   Pulse 68   Temp 97.7 F (36.5 C) (Oral)   Resp 17   Ht 5\' 6"  (1.676 m)   Wt 70.3 kg (155 lb)   SpO2 100%   BMI 25.02 kg/m   Physical Exam  Constitutional: He appears well-developed and well-nourished. No distress.  Awake, alert, nontoxic appearance  HENT:  Head: Normocephalic and atraumatic.  Mouth/Throat: Oropharynx is clear and moist. No oropharyngeal exudate.  Eyes: Conjunctivae are normal. No scleral icterus.  Neck: Normal range of motion. Neck supple.  Cardiovascular: Normal rate, regular rhythm and intact distal pulses.  Pulmonary/Chest: Accessory muscle usage present. Tachypnea noted. No respiratory distress. He has decreased breath sounds. He has wheezes.  Equal chest expansion Inspiratory and expiratory wheezes throughout.  Abdominal: Soft. Bowel sounds are normal. He exhibits no mass. There is generalized tenderness. There is no rigidity, no rebound, no guarding, no CVA tenderness, no tenderness at McBurney's point and negative Murphy's sign.  Abdomen is soft.  Generalized tenderness.  No rebound or guarding.  Musculoskeletal: Normal range of motion. He exhibits no edema.    Neurological: He is alert.  Speech is clear and goal oriented Moves extremities without ataxia  Skin: Skin is warm and dry. He is not diaphoretic.  Psychiatric: He has a normal mood and affect.  Nursing note and vitals reviewed.    ED Treatments / Results  Labs (all labs ordered are listed, but only abnormal results are displayed) Labs Reviewed  CBC WITH DIFFERENTIAL/PLATELET - Abnormal; Notable for the following components:      Result Value   WBC 3.6 (*)    RBC 3.96 (*)    Hemoglobin 9.6 (*)    HCT 29.7 (*)    MCV 75.0 (*)    MCH 24.2 (*)    RDW 19.9 (*)    Neutro Abs 1.4 (*)    All other components within normal limits  COMPREHENSIVE METABOLIC PANEL - Abnormal; Notable for the following components:   Potassium 3.3 (*)    CO2 21 (*)    Calcium 8.7 (*)  Albumin 3.2 (*)    AST 72 (*)    All other components within normal limits  LIPASE, BLOOD  ETHANOL  I-STAT TROPONIN, ED    EKG  EKG Interpretation  Date/Time:  Sunday April 19 2017 01:50:29 EST Ventricular Rate:  67 PR Interval:    QRS Duration: 94 QT Interval:  446 QTC Calculation: 471 R Axis:   78 Text Interpretation:  Sinus rhythm Probable left atrial enlargement Left ventricular hypertrophy Confirmed by Geoffery LyonseLo, Douglas ([id number]) on 04/19/2017 4:44:14 AM       Radiology Dg Chest Portable 1 View  Result Date: 04/19/2017 CLINICAL DATA:  Pain dizziness and elevated BP EXAM: PORTABLE CHEST 1 VIEW COMPARISON:  CT chest 02/28/2017, radiograph 10/06/2015 FINDINGS: The heart size and mediastinal contours are within normal limits. Aortic atherosclerosis. Both lungs are clear. The visualized skeletal structures are unremarkable. IMPRESSION: No active disease. Electronically Signed   By: Jasmine PangKim  Fujinaga M.D.   On: 04/19/2017 02:23    Procedures Procedures (including critical care time)  Medications Ordered in ED Medications  carvedilol (COREG) tablet 25 mg (not administered)  cloNIDine (CATAPRES) tablet 0.3 mg  (not administered)  hydrALAZINE (APRESOLINE) tablet 25 mg (not administered)  sucralfate (CARAFATE) tablet 1 g (not administered)  pantoprazole (PROTONIX) EC tablet 40 mg (not administered)  magnesium sulfate IVPB 2 g 50 mL (not administered)  methylPREDNISolone sodium succinate (SOLU-MEDROL) 125 mg/2 mL injection 125 mg (125 mg Intravenous Given 04/19/17 0251)  albuterol (PROVENTIL) (2.5 MG/3ML) 0.083% nebulizer solution 5 mg (5 mg Nebulization Given 04/19/17 0252)  ipratropium (ATROVENT) nebulizer solution 0.5 mg (0.5 mg Nebulization Given 04/19/17 0252)  hydrALAZINE (APRESOLINE) injection 10 mg (10 mg Intravenous Given 04/19/17 0252)  albuterol (PROVENTIL) (2.5 MG/3ML) 0.083% nebulizer solution 5 mg (5 mg Nebulization Given 04/19/17 0507)  ipratropium (ATROVENT) nebulizer solution 0.5 mg (0.5 mg Nebulization Given 04/19/17 0507)     Initial Impression / Assessment and Plan / ED Course  I have reviewed the triage vital signs and the nursing notes.  Pertinent labs & imaging results that were available during my care of the patient were reviewed by me and considered in my medical decision making (see chart for details).  Clinical Course as of Apr 20 607  Wynelle LinkSun Apr 19, 2017  0500 Patient continues to wheeze and feel short of breath.  [HM]  [phone] Discussed with Dr. Julian ReilGardner who requests that [facility] be recontacted after CT head and BP decreases  [HM]    Clinical Course User Index [HM] Kiandre Spagnolo, Dahlia ClientHannah, PA-C    Patient presents with shortness of breath and significant wheezing.  Patient has had 3 treatments with albuterol and Atrovent here in the emergency department in addition to albuterol and Atrovent with EMS.  He has also been given Solu-Medrol with persistent wheezing.  Magnesium initiated.  No history in his chart of COPD however patient is a longtime smoker.  Suspect asthma/COPD exacerbation today.  Chest x-ray is without evidence of pulmonary edema, pneumothorax or pneumonia.  I personally  reviewed these images.  He will need admission for further breathing treatments.  Patient found to be hypertensive.  He reports he has been taking his medications but upon further questioning it appears that he has been altering the doses some because he is running out.  Record review shows numerous visits for hypertension and noncompliance with his medications.  He is without chest pain.  Troponin is negative.  EKG without acute ischemia.  Mild hypokalemia noted at 3.3.  Patient given hydralazine and his  home meds.   6:10 AM Head CT pending.  Patient is receiving his home hypertension medications at this time. At shift change care was transferred to [facility], PA-C who will follow CT scan, blood pressure and admit.   Final Clinical Impressions(s) / ED Diagnoses   Final diagnoses:  Hypokalemia  Wheezing  Hypertension, unspecified type    ED Discharge Orders    None       Milta Deiters 04/19/17 4098    Geoffery Lyons, MD 04/19/17 0630

## 2017-04-20 DIAGNOSIS — F1020 Alcohol dependence, uncomplicated: Secondary | ICD-10-CM | POA: Diagnosis present

## 2017-04-20 DIAGNOSIS — I5031 Acute diastolic (congestive) heart failure: Secondary | ICD-10-CM | POA: Diagnosis not present

## 2017-04-20 DIAGNOSIS — T465X6A Underdosing of other antihypertensive drugs, initial encounter: Secondary | ICD-10-CM | POA: Diagnosis present

## 2017-04-20 DIAGNOSIS — N183 Chronic kidney disease, stage 3 (moderate): Secondary | ICD-10-CM | POA: Diagnosis present

## 2017-04-20 DIAGNOSIS — I130 Hypertensive heart and chronic kidney disease with heart failure and stage 1 through stage 4 chronic kidney disease, or unspecified chronic kidney disease: Secondary | ICD-10-CM | POA: Diagnosis present

## 2017-04-20 DIAGNOSIS — E876 Hypokalemia: Secondary | ICD-10-CM | POA: Diagnosis present

## 2017-04-20 DIAGNOSIS — R42 Dizziness and giddiness: Secondary | ICD-10-CM | POA: Diagnosis present

## 2017-04-20 DIAGNOSIS — I361 Nonrheumatic tricuspid (valve) insufficiency: Secondary | ICD-10-CM | POA: Diagnosis not present

## 2017-04-20 DIAGNOSIS — I10 Essential (primary) hypertension: Secondary | ICD-10-CM | POA: Diagnosis not present

## 2017-04-20 DIAGNOSIS — I5033 Acute on chronic diastolic (congestive) heart failure: Secondary | ICD-10-CM | POA: Diagnosis present

## 2017-04-20 DIAGNOSIS — J441 Chronic obstructive pulmonary disease with (acute) exacerbation: Secondary | ICD-10-CM | POA: Diagnosis present

## 2017-04-20 DIAGNOSIS — Z833 Family history of diabetes mellitus: Secondary | ICD-10-CM | POA: Diagnosis not present

## 2017-04-20 DIAGNOSIS — Y92009 Unspecified place in unspecified non-institutional (private) residence as the place of occurrence of the external cause: Secondary | ICD-10-CM | POA: Diagnosis not present

## 2017-04-20 DIAGNOSIS — R109 Unspecified abdominal pain: Secondary | ICD-10-CM | POA: Diagnosis present

## 2017-04-20 DIAGNOSIS — N179 Acute kidney failure, unspecified: Secondary | ICD-10-CM | POA: Diagnosis not present

## 2017-04-20 DIAGNOSIS — F1010 Alcohol abuse, uncomplicated: Secondary | ICD-10-CM | POA: Diagnosis not present

## 2017-04-20 DIAGNOSIS — R9431 Abnormal electrocardiogram [ECG] [EKG]: Secondary | ICD-10-CM | POA: Diagnosis present

## 2017-04-20 DIAGNOSIS — T501X5A Adverse effect of loop [high-ceiling] diuretics, initial encounter: Secondary | ICD-10-CM | POA: Diagnosis present

## 2017-04-20 DIAGNOSIS — R062 Wheezing: Secondary | ICD-10-CM | POA: Diagnosis present

## 2017-04-20 DIAGNOSIS — F17210 Nicotine dependence, cigarettes, uncomplicated: Secondary | ICD-10-CM | POA: Diagnosis present

## 2017-04-20 DIAGNOSIS — D509 Iron deficiency anemia, unspecified: Secondary | ICD-10-CM | POA: Diagnosis present

## 2017-04-20 DIAGNOSIS — I160 Hypertensive urgency: Secondary | ICD-10-CM | POA: Diagnosis present

## 2017-04-20 LAB — COMPREHENSIVE METABOLIC PANEL
ALBUMIN: 2.8 g/dL — AB (ref 3.5–5.0)
ALK PHOS: 66 U/L (ref 38–126)
ALT: 43 U/L (ref 17–63)
ANION GAP: 7 (ref 5–15)
AST: 47 U/L — ABNORMAL HIGH (ref 15–41)
BUN: 21 mg/dL — ABNORMAL HIGH (ref 6–20)
CALCIUM: 8.5 mg/dL — AB (ref 8.9–10.3)
CO2: 20 mmol/L — AB (ref 22–32)
Chloride: 107 mmol/L (ref 101–111)
Creatinine, Ser: 1.26 mg/dL — ABNORMAL HIGH (ref 0.61–1.24)
GFR calc non Af Amer: >60 mL/min (ref >60)
GLUCOSE: 113 mg/dL — AB (ref 65–99)
POTASSIUM: 3.7 mmol/L (ref 3.5–5.1)
SODIUM: 134 mmol/L — AB (ref 135–145)
Total Bilirubin: 0.4 mg/dL (ref 0.3–1.2)
Total Protein: 6.4 g/dL — ABNORMAL LOW (ref 6.5–8.1)

## 2017-04-20 LAB — CBC
HEMATOCRIT: 25.2 % — AB (ref 39.0–52.0)
HEMOGLOBIN: 8 g/dL — AB (ref 13.0–17.0)
MCH: 23.9 pg — AB (ref 26.0–34.0)
MCHC: 31.7 g/dL (ref 30.0–36.0)
MCV: 75.2 fL — ABNORMAL LOW (ref 78.0–100.0)
Platelets: 252 10*3/uL (ref 150–400)
RBC: 3.35 MIL/uL — ABNORMAL LOW (ref 4.22–5.81)
RDW: 20.9 % — ABNORMAL HIGH (ref 11.5–15.5)
WBC: 9.3 10*3/uL (ref 4.0–10.5)

## 2017-04-20 LAB — BRAIN NATRIURETIC PEPTIDE: B Natriuretic Peptide: 993.5 pg/mL — ABNORMAL HIGH (ref 0.0–100.0)

## 2017-04-20 MED ORDER — DOXYCYCLINE HYCLATE 100 MG IV SOLR
100.0000 mg | Freq: Two times a day (BID) | INTRAVENOUS | Status: DC
  Filled 2017-04-20: qty 100

## 2017-04-20 MED ORDER — FUROSEMIDE 10 MG/ML IJ SOLN
40.0000 mg | Freq: Two times a day (BID) | INTRAMUSCULAR | Status: DC
  Administered 2017-04-20 – 2017-04-22 (×5): 40 mg via INTRAVENOUS
  Filled 2017-04-20 (×5): qty 4

## 2017-04-20 MED ORDER — SODIUM CHLORIDE 0.9 % IV BOLUS (SEPSIS): 500.0000 mL | Freq: Once | INTRAVENOUS | Status: DC

## 2017-04-20 MED ORDER — POTASSIUM CHLORIDE CRYS ER 20 MEQ PO TBCR
30.0000 meq | EXTENDED_RELEASE_TABLET | Freq: Two times a day (BID) | ORAL | Status: DC
  Administered 2017-04-20 (×2): 30 meq via ORAL
  Filled 2017-04-20 (×3): qty 1

## 2017-04-20 MED ORDER — PREDNISONE 20 MG PO TABS: 50.0000 mg | ORAL_TABLET | Freq: Every day | ORAL | Status: DC

## 2017-04-20 MED ORDER — HYDROCHLOROTHIAZIDE 12.5 MG PO CAPS: 12.5000 mg | ORAL_CAPSULE | Freq: Every day | ORAL | Status: DC

## 2017-04-20 MED ORDER — HYDRALAZINE HCL 50 MG PO TABS
50.0000 mg | ORAL_TABLET | Freq: Three times a day (TID) | ORAL | Status: DC
  Administered 2017-04-20 – 2017-04-22 (×5): 50 mg via ORAL
  Filled 2017-04-20 (×6): qty 1

## 2017-04-20 MED ORDER — LISINOPRIL 10 MG PO TABS
10.0000 mg | ORAL_TABLET | Freq: Every day | ORAL | Status: DC
  Administered 2017-04-20 – 2017-04-22 (×3): 10 mg via ORAL
  Filled 2017-04-20 (×3): qty 1

## 2017-04-20 NOTE — Plan of Care (Signed)
  Activity: Risk for activity intolerance will decrease 04/20/2017 1550 - Progressing by Darrow BussingArcilla, Jenaya Saar M, RN   Nutrition: Adequate nutrition will be maintained 04/20/2017 1550 - Progressing by Darrow BussingArcilla, Ercole Georg M, RN   Coping: Level of anxiety will decrease 04/20/2017 1550 - Progressing by Darrow BussingArcilla, Corita Allinson M, RN   Elimination: Will not experience complications related to bowel motility 04/20/2017 1550 - Progressing by Darrow BussingArcilla, Michail Boyte M, RN

## 2017-04-20 NOTE — Progress Notes (Signed)
[facility] PROGRESS NOTE    Progress Note  Austin Mendez  JXB:[id number] DOB: March 24, 1961 DOA: 04/19/2017 PCP: Martha Clan, MD     Brief Narrative:   ROI Austin Mendez is an 57 y.o. male past medical history is of poorly controlled essential hypertension, noncompliance with his medication, alcohol abuse 30-40-year pack history of tobacco abuse, patient does not had a diagnosis of COPD, comes into the ED for shortness of breath, dizziness and some nausea but no vomiting.  Mendez denies any fevers and not feeling well over the past 2 weeks denies any cough or hemoptysis  Assessment/Plan:   SOB (shortness of breath)/essential hypertension: Previous 2D echo done on March 2018 showed grade 1 diastolic heart failure. Here on my examination Mendez has obvious JVD with a probable 4th heart sound on auscultation.  Check a BNP Will DC steroids, clonidine and inhalers. We will start him on the heart failure pathway. Mendez is already on Coreg would add low-dose ACE inhibitor. We will start him on IV Lasix monitor strict I's and O's Daily weight. Replete potassium orally, will restrict his diet to 1200. Cardiac biomarkers were negative on admission. Blood pressure significantly high  Hypokalemia: Replete orally now resolved.  Alcohol abuse: Mendez relates drinking 1 beer a day. Mendez was given a banana bag on admission.  Abnormal EKG: Probably early re-pole without changes.  Normocytic anemia: Workup as an outpatient.  Chronic kidney disease stage III: Mild increase in his BUN 21, his baseline creatinine looking back through his chart is 1.0-1.2   DVT prophylaxis: lovenox Family Communication:none Disposition Plan/Barrier to D/C: unable to determine Code Status:     Code Status Orders  (From admission, onward)        Start     Ordered   04/19/17 1011  Full code  Continuous     04/19/17 1018    Code Status History    Date Active Date Inactive Code Status Order ID Comments User  Context   11/19/2015 15:43 11/20/2015 17:55 Full Code [id number]  Austin Overlie, MD ED   09/07/2015 14:44 09/21/2015 18:31 Full Code [id number]  Austin Eke, PA-C ED   08/02/2012 17:43 08/05/2012 19:47 Full Code [id number]  Austin North, MD Inpatient        IV Access:    Peripheral IV   Procedures and diagnostic studies:   Ct Head Wo Contrast  Result Date: 04/19/2017 CLINICAL DATA:  Dizziness EXAM: CT HEAD WITHOUT CONTRAST TECHNIQUE: Contiguous axial images were obtained from the base of the skull through the vertex without intravenous contrast. COMPARISON:  02/28/2017 FINDINGS: Brain: Chronic atrophic and ischemic changes are again identified and stable. No findings to suggest acute hemorrhage, acute infarction or space-occupying mass lesion are noted. Vascular: No hyperdense vessel or unexpected calcification. Skull: Normal. Negative for fracture or focal lesion. Sinuses/Orbits: No acute finding. Other: None. IMPRESSION: Chronic atrophic and ischemic changes.  No acute abnormality noted. Electronically Signed   By: Alcide Clever M.D.   On: 04/19/2017 08:44   Dg Chest Portable 1 View  Result Date: 04/19/2017 CLINICAL DATA:  Pain dizziness and elevated BP EXAM: PORTABLE CHEST 1 VIEW COMPARISON:  CT chest 02/28/2017, radiograph 10/06/2015 FINDINGS: The heart size and mediastinal contours are within normal limits. Aortic atherosclerosis. Both lungs are clear. The visualized skeletal structures are unremarkable. IMPRESSION: No active disease. Electronically Signed   By: Jasmine Pang M.D.   On: 04/19/2017 02:23     Medical Consultants:    None.  Anti-Infectives:  none  Subjective:    Austin Mendez relates orthopnea at home, No cough or shortness of breath with ambulation, no lower extremity edema.  Objective:    Vitals:   04/19/17 1455 04/19/17 1700 04/19/17 1922 04/20/17 0609  BP: (!) 173/85 (!) 184/85 (!) 173/85 (!) 176/96  Pulse: 68  74 84  Resp: 20  20 20     Temp:   97.8 F (36.6 C) 98 F (36.7 C)  TempSrc:   Axillary Oral  SpO2:   98% 98%  Weight:      Height:        Intake/Output Summary (Last 24 hours) at 04/20/2017 0813 Last data filed at 04/20/2017 0500 Gross per 24 hour  Intake 4200 ml  Output 300 ml  Net 3900 ml   Filed Weights   04/19/17 0143 04/19/17 1130 04/19/17 1400  Weight: 70.3 kg (155 lb) 70.5 kg (155 lb 6.8 oz) 64.2 kg (141 lb 8.6 oz)    Exam: General exam: In no acute distress. Respiratory system: Good air movement with crackles at bases Cardiovascular system: S1 & S2 heard, RRR.  Positive JVD Gastrointestinal system: Abdomen is nondistended, soft and nontender.  Central nervous system: Alert and oriented. No focal neurological deficits. Extremities: No pedal edema. Skin: No rashes, lesions or ulcers Psychiatry: Judgement and insight appear normal. Mood & affect appropriate.    Data Reviewed:    Labs: Basic Metabolic Panel: Recent Labs  Lab 04/19/17 0236 04/19/17 1145 04/20/17 0626  NA 135  --  134*  K 3.3*  --  3.7  CL 105  --  107  CO2 21*  --  20*  GLUCOSE 89  --  113*  BUN 10  --  21*  CREATININE 1.07 1.25* 1.26*  CALCIUM 8.7*  --  8.5*  MG  --  2.1  --    GFR Estimated Creatinine Clearance: 59.1 mL/min (A) (by C-G formula based on SCr of 1.26 mg/dL (H)). Liver Function Tests: Recent Labs  Lab 04/19/17 0236 04/20/17 0626  AST 72* 47*  ALT 58 43  ALKPHOS 76 66  BILITOT 0.5 0.4  PROT 7.4 6.4*  ALBUMIN 3.2* 2.8*   Recent Labs  Lab 04/19/17 0236  LIPASE 38   No results for input(s): AMMONIA in the last 168 hours. Coagulation profile Recent Labs  Lab 04/19/17 1145  INR 1.08    CBC: Recent Labs  Lab 04/19/17 0236 04/19/17 1145  WBC 3.6* 3.4*  NEUTROABS 1.4*  --   HGB 9.6* 9.6*  HCT 29.7* 30.1*  MCV 75.0* 74.5*  PLT 316 341   Cardiac Enzymes: No results for input(s): CKTOTAL, CKMB, CKMBINDEX, TROPONINI in the last 168 hours. BNP (last 3 results) No results for  input(s): PROBNP in the last 8760 hours. CBG: No results for input(s): GLUCAP in the last 168 hours. D-Dimer: No results for input(s): DDIMER in the last 72 hours. Hgb A1c: No results for input(s): HGBA1C in the last 72 hours. Lipid Profile: No results for input(s): CHOL, HDL, LDLCALC, TRIG, CHOLHDL, LDLDIRECT in the last 72 hours. Thyroid function studies: Recent Labs    04/19/17 1145  TSH 0.749   Anemia work up: No results for input(s): VITAMINB12, FOLATE, FERRITIN, TIBC, IRON, RETICCTPCT in the last 72 hours. Sepsis Labs: Recent Labs  Lab 04/19/17 0236 04/19/17 1145  WBC 3.6* 3.4*   Microbiology No results found for this or any previous visit (from the past 240 hour(s)).   Medications:   . bisacodyl  10  mg Rectal Once  . carvedilol  25 mg Oral BID WC  . cloNIDine  0.3 mg Oral BID  . enoxaparin (LOVENOX) injection  40 mg Subcutaneous Q24H  . hydrALAZINE  25 mg Oral Q8H  . pantoprazole  40 mg Oral Daily  . predniSONE  40 mg Oral Q breakfast   Continuous Infusions: . 0.9 % NaCl with KCl 40 mEq / L 150 mL/hr (04/19/17 1441)     LOS: 0 days   Marinda ElkAbraham Feliz Ortiz  [facility] Pager [phone]  *Please refer to [email or web address], password [email or web address] to get updated schedule on who will round on this patient, as hospitalists switch teams weekly. If 7PM-7AM, please contact night-coverage at [email or web address], password [email or web address] for any overnight needs.  04/20/2017, 8:13 AM

## 2017-04-21 ENCOUNTER — Other Ambulatory Visit (HOSPITAL_COMMUNITY): Payer: Medicaid Other

## 2017-04-21 ENCOUNTER — Inpatient Hospital Stay (HOSPITAL_COMMUNITY): Payer: Medicaid Other

## 2017-04-21 DIAGNOSIS — I361 Nonrheumatic tricuspid (valve) insufficiency: Secondary | ICD-10-CM

## 2017-04-21 LAB — ECHOCARDIOGRAM COMPLETE
AOASC: 34 cm
Area-P 1/2: 2.78 cm2
EWDT: 271 ms
FS: 30 % (ref 28–44)
Height: 66 in
IVS/LV PW RATIO, ED: 0.83
LA ID, A-P, ES: 38 mm
LA diam end sys: 38 mm
LA diam index: 2.19 cm/m2
LA vol A4C: 58.9 ml
LAVOL: 51.7 mL
LAVOLIN: 29.8 mL/m2
LDCA: 4.15 cm2
LVOTD: 23 mm
MV Dec: 271
MV pk A vel: 62.1 m/s
MV pk E vel: 44.6 m/s
MVSPHT: 79 ms
PW: 15.1 mm — AB (ref 0.6–1.1)
RV TAPSE: 20 mm
Reg peak vel: 216 cm/s
TRMAXVEL: 216 cm/s
Weight: 2264.57 oz

## 2017-04-21 LAB — HIV ANTIBODY (ROUTINE TESTING W REFLEX): HIV Screen 4th Generation wRfx: NONREACTIVE

## 2017-04-21 LAB — BASIC METABOLIC PANEL
Anion gap: 8 (ref 5–15)
BUN: 14 mg/dL (ref 6–20)
CALCIUM: 8.7 mg/dL — AB (ref 8.9–10.3)
CO2: 27 mmol/L (ref 22–32)
CREATININE: 1.2 mg/dL (ref 0.61–1.24)
Chloride: 102 mmol/L (ref 101–111)
GFR calc Af Amer: >60 mL/min (ref >60)
GLUCOSE: 86 mg/dL (ref 65–99)
Potassium: 3.4 mmol/L — ABNORMAL LOW (ref 3.5–5.1)
SODIUM: 137 mmol/L (ref 135–145)

## 2017-04-21 LAB — MAGNESIUM: MAGNESIUM: 1.6 mg/dL — AB (ref 1.7–2.4)

## 2017-04-21 MED ORDER — POTASSIUM CHLORIDE CRYS ER 20 MEQ PO TBCR
40.0000 meq | EXTENDED_RELEASE_TABLET | Freq: Two times a day (BID) | ORAL | Status: DC
  Administered 2017-04-22: 40 meq via ORAL
  Filled 2017-04-21 (×2): qty 2

## 2017-04-21 MED ORDER — LORAZEPAM 2 MG/ML IJ SOLN
0.0000 mg | Freq: Two times a day (BID) | INTRAMUSCULAR | Status: DC
  Administered 2017-04-22: 2 mg via INTRAVENOUS

## 2017-04-21 MED ORDER — HYDRALAZINE HCL 20 MG/ML IJ SOLN
10.0000 mg | Freq: Four times a day (QID) | INTRAMUSCULAR | Status: DC | PRN
  Administered 2017-04-21: 10 mg via INTRAVENOUS
  Filled 2017-04-21: qty 1

## 2017-04-21 MED ORDER — THIAMINE HCL 100 MG/ML IJ SOLN: 100.0000 mg | Freq: Every day | INTRAMUSCULAR | Status: DC

## 2017-04-21 MED ORDER — HYDRALAZINE HCL 20 MG/ML IJ SOLN
10.0000 mg | Freq: Once | INTRAMUSCULAR | Status: AC
  Administered 2017-04-21: 10 mg via INTRAVENOUS

## 2017-04-21 MED ORDER — VITAMIN B-1 100 MG PO TABS
100.0000 mg | ORAL_TABLET | Freq: Every day | ORAL | Status: DC
  Administered 2017-04-21 – 2017-04-22 (×2): 100 mg via ORAL
  Filled 2017-04-21 (×2): qty 1

## 2017-04-21 MED ORDER — LORAZEPAM 1 MG PO TABS: 1.0000 mg | ORAL_TABLET | Freq: Four times a day (QID) | ORAL | Status: DC | PRN

## 2017-04-21 MED ORDER — POTASSIUM CHLORIDE CRYS ER 20 MEQ PO TBCR
40.0000 meq | EXTENDED_RELEASE_TABLET | Freq: Once | ORAL | Status: AC
  Administered 2017-04-21: 40 meq via ORAL
  Filled 2017-04-21: qty 2

## 2017-04-21 MED ORDER — LORAZEPAM 2 MG/ML IJ SOLN
0.0000 mg | Freq: Four times a day (QID) | INTRAMUSCULAR | Status: DC
  Administered 2017-04-21: 2 mg via INTRAVENOUS
  Administered 2017-04-21: 1 mg via INTRAVENOUS
  Administered 2017-04-22 (×2): 2 mg via INTRAVENOUS
  Filled 2017-04-21 (×2): qty 1
  Filled 2017-04-21: qty 2
  Filled 2017-04-21: qty 1

## 2017-04-21 MED ORDER — HYDRALAZINE HCL 20 MG/ML IJ SOLN
INTRAMUSCULAR | Status: AC
  Filled 2017-04-21: qty 1

## 2017-04-21 MED ORDER — LORAZEPAM 2 MG/ML IJ SOLN
2.0000 mg | Freq: Once | INTRAMUSCULAR | Status: AC
  Administered 2017-04-21: 2 mg via INTRAVENOUS
  Filled 2017-04-21: qty 1

## 2017-04-21 MED ORDER — ADULT MULTIVITAMIN W/MINERALS CH
1.0000 | ORAL_TABLET | Freq: Every day | ORAL | Status: DC
  Administered 2017-04-21 – 2017-04-22 (×2): 1 via ORAL
  Filled 2017-04-21 (×2): qty 1

## 2017-04-21 MED ORDER — FOLIC ACID 1 MG PO TABS
1.0000 mg | ORAL_TABLET | Freq: Every day | ORAL | Status: DC
  Administered 2017-04-21 – 2017-04-22 (×2): 1 mg via ORAL
  Filled 2017-04-21 (×2): qty 1

## 2017-04-21 MED ORDER — LORAZEPAM 2 MG/ML IJ SOLN
1.0000 mg | Freq: Four times a day (QID) | INTRAMUSCULAR | Status: DC | PRN
  Filled 2017-04-21 (×2): qty 1

## 2017-04-21 NOTE — Progress Notes (Signed)
Patient is actively refusing to have Leads placed to monitor heart function

## 2017-04-21 NOTE — Progress Notes (Addendum)
[facility] PROGRESS NOTE    Progress Note  Austin Mendez  JYN:[id number] DOB: Sep 04, 1960 DOA: 04/19/2017 PCP: Martha Clan, MD     Brief Narrative:   Austin Mendez is an 57 y.o. male past medical history is of poorly controlled essential hypertension, noncompliance with his medication, alcohol abuse 30-40-year pack history of tobacco abuse, patient does not had a diagnosis of COPD, comes into the ED for shortness of breath, dizziness and some nausea but no vomiting.  He denies any fevers and not feeling well over the past 2 weeks denies any cough or hemoptysis.   Assessment/Plan:   SOB (shortness of breath)/essential hypertension: Previous 2D echo done on March 2018 showed grade 1 diastolic heart failure. BNP elevated at 993.5 in the setting of normal renal function, will check 2-D echo Continue Coreg, patient also being diuresed with Lasix 40 mg IV every 12 with improvement in his renal function with diuresis He is already on Coreg would add low-dose ACE inhibitor. Cardiac biomarkers were negative on admission. Blood pressure significantly high, continue as needed hydralazine Repeat  echo  Hypokalemia: Replete  And recheck  Alcohol abuse: He relates drinking 1 beer a day. Likely underestimates the drinking He was given a banana bag on admission. Start patient on CIWA protocol  Abnormal EKG: Probably early re-pole without changes.  Normocytic anemia: Workup as an outpatient.  Chronic kidney disease stage III: Mild increase in his BUN 21, his baseline creatinine looking back through his chart is 1.0-1.2  Hypertensive urgency Blood pressure 206/116 Patient received hydralazine this morning, repeat blood pressure 180/97, complained of dizziness and blurry vision, Also complains of head spinning We'll check MRI of the brain to rule out CVA , CT head  Negative for any acute abnormality Apparently MRI is to be discolored the patient is on precautions for  bedbugs  [facility] hospital policy, contact precautions   DVT prophylaxis: lovenox Family Communication:none Disposition Plan/Barrier to D/C: pending improvement Code Status:     Code Status Orders "  (From admission, onward)        IV Access:    Peripheral IV   Procedures and diagnostic studies:   No results found.   Medical Consultants:    None.  Anti-Infectives:   none  Subjective:    [name]  , extremely restless, complains of dizziness and room spinning  Objective:    Vitals:   04/21/17 0649 04/21/17 0743 04/21/17 0800 04/21/17 0855  BP: (!) 184/97 (!) 185/100 (!) 190/90 (!) 178/92  Pulse:   70 72  Resp:   20   Temp:   97.6 F (36.4 C)   TempSrc:   Oral   SpO2:   100% 98%  Weight:      Height:        Intake/Output Summary (Last 24 hours) at 04/21/2017 1016 Last data filed at 04/21/2017 0700 Gross per 24 hour  Intake 360 ml  Output 900 ml  Net -540 ml   Filed Weights   04/19/17 0143 04/19/17 1130 04/19/17 1400  Weight: 70.3 kg (155 lb) 70.5 kg (155 lb 6.8 oz) 64.2 kg (141 lb 8.6 oz)    Exam: General exam: In no acute distress. Respiratory system: Good air movement with crackles at bases Cardiovascular system: S1 & S2 heard, RRR.  Positive JVD Gastrointestinal system: Abdomen is nondistended, soft and nontender.  Central nervous system: Alert and oriented. No focal neurological deficits. Extremities: No pedal edema. Skin: No rashes, lesions or ulcers Psychiatry:  Judgement and insight appear normal. Mood & affect appropriate.    Data Reviewed:    Labs: Basic Metabolic Panel: Recent Labs  Lab 04/19/17 0236 04/19/17 1145 04/20/17 0626 04/21/17 0633  NA 135  --  134* 137  K 3.3*  --  3.7 3.4*  CL 105  --  107 102  CO2 21*  --  20* 27  GLUCOSE 89  --  113* 86  BUN 10  --  21* 14  CREATININE 1.07 1.25* 1.26* 1.20  CALCIUM 8.7*  --  8.5* 8.7*  MG  --  2.1  --   --    GFR Estimated Creatinine Clearance:  62 mL/min (by C-G formula based on SCr of 1.2 mg/dL). Liver Function Tests: Recent Labs  Lab 04/19/17 0236 04/20/17 0626  AST 72* 47*  ALT 58 43  ALKPHOS 76 66  BILITOT 0.5 0.4  PROT 7.4 6.4*  ALBUMIN 3.2* 2.8*   Recent Labs  Lab 04/19/17 0236  LIPASE 38   No results for input(s): AMMONIA in the last 168 hours. Coagulation profile Recent Labs  Lab 04/19/17 1145  INR 1.08    CBC: Recent Labs  Lab 04/19/17 0236 04/19/17 1145 04/20/17 0626  WBC 3.6* 3.4* 9.3  NEUTROABS 1.4*  --   --   HGB 9.6* 9.6* 8.0*  HCT 29.7* 30.1* 25.2*  MCV 75.0* 74.5* 75.2*  PLT 316 341 252   Cardiac Enzymes: No results for input(s): CKTOTAL, CKMB, CKMBINDEX, TROPONINI in the last 168 hours. BNP (last 3 results) No results for input(s): PROBNP in the last 8760 hours. CBG: No results for input(s): GLUCAP in the last 168 hours. D-Dimer: No results for input(s): DDIMER in the last 72 hours. Hgb A1c: No results for input(s): HGBA1C in the last 72 hours. Lipid Profile: No results for input(s): CHOL, HDL, LDLCALC, TRIG, CHOLHDL, LDLDIRECT in the last 72 hours. Thyroid function studies: Recent Labs    04/19/17 1145  TSH 0.749   Anemia work up: No results for input(s): VITAMINB12, FOLATE, FERRITIN, TIBC, IRON, RETICCTPCT in the last 72 hours. Sepsis Labs: Recent Labs  Lab 04/19/17 0236 04/19/17 1145 04/20/17 0626  WBC 3.6* 3.4* 9.3   Microbiology No results found for this or any previous visit (from the past 240 hour(s)).   Medications:   . carvedilol  25 mg Oral BID WC  . enoxaparin (LOVENOX) injection  40 mg Subcutaneous Q24H  . folic acid  1 mg Oral Daily  . furosemide  40 mg Intravenous Q12H  . hydrALAZINE  50 mg Oral Q8H  . lisinopril  10 mg Oral Daily  . LORazepam  0-4 mg Intravenous Q6H   Followed by  . [START ON 04/23/2017] LORazepam  0-4 mg Intravenous Q12H  . multivitamin with minerals  1 tablet Oral Daily  . pantoprazole  40 mg Oral Daily  . potassium chloride   30 mEq Oral BID  . thiamine  100 mg Oral Daily   Or  . thiamine  100 mg Intravenous Daily   Continuous Infusions:    LOS: 1 day   Richarda OverlieNayana Kenyia Wambolt  [facility] Pager [phone]  *Please refer to [email or web address], password [email or web address] to get updated schedule on who will round on this patient, as hospitalists switch teams weekly. If 7PM-7AM, please contact night-coverage at [email or web address], password [email or web address] for any overnight needs.  04/21/2017, 10:16 AM

## 2017-04-21 NOTE — Progress Notes (Addendum)
Patient is frequently asking when he can leave, trying to get out of bed. Patient is oriented to time, person, and place, but not situation. Asking who is going to drive him home, how is he going to get home. Constantly reminded to stay in bed.

## 2017-04-21 NOTE — Progress Notes (Signed)
Pt telemetry is off went to replace middle brown lead and electrode pt refused to have it place back on paged Dr. Susie CassetteAbrol no response regarding text page. Explained to pt need for telemetry pt still refused. Will continue to monitor pt status at this time

## 2017-04-21 NOTE — Progress Notes (Signed)
High BP 206/116. On called paged, order received for 10mg  Hydralazine IV x1. Med given. Blood pressure rechecked 184/97. EKG done. Pt complaining of dizziness and blurred vision. BP rechecked still high at 185/100. Pt up eating breakfast and conversating. Report given to on coming RN

## 2017-04-21 NOTE — Progress Notes (Signed)
  Echocardiogram 2D Echocardiogram has been performed.  Austin Mendez G Austin Mendez 04/21/2017, 3:18 PM

## 2017-04-22 ENCOUNTER — Inpatient Hospital Stay (HOSPITAL_COMMUNITY): Payer: Medicaid Other

## 2017-04-22 ENCOUNTER — Encounter (HOSPITAL_COMMUNITY): Payer: Self-pay | Admitting: General Practice

## 2017-04-22 DIAGNOSIS — I5031 Acute diastolic (congestive) heart failure: Secondary | ICD-10-CM | POA: Diagnosis not present

## 2017-04-22 DIAGNOSIS — I10 Essential (primary) hypertension: Secondary | ICD-10-CM | POA: Diagnosis not present

## 2017-04-22 DIAGNOSIS — F1010 Alcohol abuse, uncomplicated: Secondary | ICD-10-CM

## 2017-04-22 LAB — BASIC METABOLIC PANEL
ANION GAP: 12 (ref 5–15)
BUN: 20 mg/dL (ref 6–20)
CALCIUM: 9.4 mg/dL (ref 8.9–10.3)
CO2: 23 mmol/L (ref 22–32)
CREATININE: 1.37 mg/dL — AB (ref 0.61–1.24)
Chloride: 103 mmol/L (ref 101–111)
GFR calc Af Amer: >60 mL/min (ref >60)
GFR calc non Af Amer: 56 mL/min — ABNORMAL LOW (ref >60)
GLUCOSE: 107 mg/dL — AB (ref 65–99)
Potassium: 3.6 mmol/L (ref 3.5–5.1)
Sodium: 138 mmol/L (ref 135–145)

## 2017-04-22 MED ORDER — CARVEDILOL 25 MG PO TABS: 25.0000 mg | ORAL_TABLET | Freq: Two times a day (BID) | ORAL | 0 refills | Status: DC

## 2017-04-22 MED ORDER — FOLIC ACID 1 MG PO TABS: 1.0000 mg | ORAL_TABLET | Freq: Every day | ORAL | 0 refills | Status: AC

## 2017-04-22 MED ORDER — AMLODIPINE BESYLATE 5 MG PO TABS: 5.0000 mg | ORAL_TABLET | Freq: Every day | ORAL | 0 refills | Status: AC

## 2017-04-22 MED ORDER — THIAMINE HCL 100 MG PO TABS: 100.0000 mg | ORAL_TABLET | Freq: Every day | ORAL | 0 refills | Status: AC

## 2017-04-22 MED ORDER — ADULT MULTIVITAMIN W/MINERALS CH: 1.0000 | ORAL_TABLET | Freq: Every day | ORAL | Status: AC

## 2017-04-22 MED ORDER — POTASSIUM CHLORIDE CRYS ER 20 MEQ PO TBCR: 20.0000 meq | EXTENDED_RELEASE_TABLET | Freq: Every day | ORAL | 0 refills | Status: DC

## 2017-04-22 MED ORDER — ALBUTEROL SULFATE HFA 108 (90 BASE) MCG/ACT IN AERS: 2.0000 | INHALATION_SPRAY | Freq: Four times a day (QID) | RESPIRATORY_TRACT | 0 refills | Status: AC | PRN

## 2017-04-22 MED ORDER — FUROSEMIDE 40 MG PO TABS: 40.0000 mg | ORAL_TABLET | Freq: Every day | ORAL | 0 refills | Status: AC

## 2017-04-22 MED ORDER — HYDRALAZINE HCL 50 MG PO TABS: 50.0000 mg | ORAL_TABLET | Freq: Three times a day (TID) | ORAL | 0 refills | Status: DC

## 2017-04-22 MED ORDER — MAGNESIUM SULFATE 2 GM/50ML IV SOLN
2.0000 g | Freq: Once | INTRAVENOUS | Status: AC
  Administered 2017-04-22: 2 g via INTRAVENOUS
  Filled 2017-04-22: qty 50

## 2017-04-22 NOTE — Discharge Summary (Signed)
Physician Discharge Summary  Austin RoughWilliam D Mendez ZOX:[id number] DOB: 06-03-1960  PCP: [name], [name], MD  Admit date: 04/19/2017 Discharge date: 04/22/2017  Recommendations for Outpatient Follow-up:  1. Dr. Martha ClanWilliam Mendez on 04/27/17 at 1:30 PM.  To be seen with repeat labs (CBC, BMP & Mg).  Home Health: None.  Patient declined SNF or home health physical therapy. Equipment/Devices: None  Discharge Condition: Improved and stable CODE STATUS: Full Diet recommendation: Heart healthy diet.  Discharge Diagnoses:  Active Problems:   Alcohol abuse   Essential hypertension   Hypokalemia   Hypertension   SOB (shortness of breath)   Tachypnea   Acute diastolic heart failure (HCC)   Brief Summary: 57 year old male with PMH of poorly controlled HTN, reported noncompliance with medications, poor overall functional status, alcohol dependence, tobacco abuse, prior dizziness and giddiness, presented to ED with dyspnea, dizziness, abdominal pain with nausea but no vomiting.  In the ED, noted to be wheezing, dyspneic but oxygen saturations were greater than 95% throughout on room air.  He was given a dose of IV Solu-Medrol and inhaled bronchodilators.  He was markedly hypertensive.  He was admitted for suspected CHF exacerbation in the context of poorly controlled hypertension and possible COPD exacerbation as well.  Assessment and plan:  1. Acute diastolic CHF: TTE 04/21/17: LVEF 55-60% and grade 1 diastolic dysfunction.  BNP 993.5.  He was initiated on IV Lasix 40 mg every 12 hours.  Intake output charting seems inaccurate.  -760 mL noted yesterday.  Clinically appears euvolemic.  Creatinine also has increased slightly from 1.2-1.3 range.  DC IV Lasix and transition to oral Lasix 40 mg daily at discharge with close follow-up of BMP as outpatient.  Discontinued lisinopril in the context of history of alcohol dependence and risk of dehydration and worsening renal function. 2. COPD exacerbation: Mild.  His  dyspnea was probably more from decompensated CHF rather than COPD exacerbation.  Tobacco cessation counseled.  Albuterol inhaler as needed. 3. Essential hypertension/hypertensive urgency: Presented with BP of 206/116.  Discontinued clonidine.  Increase hydralazine and carvedilol as below.  Discontinued newly started lisinopril due to concern for worsening renal insufficiency.  Added amlodipine 5 mg daily at discharge.  Improved.  Close outpatient follow-up. 4. Hypokalemia: Replaced. 5. Hypomagnesemia: Replaced prior to discharge.  Outpatient follow-up with repeat labs. 6. Acute kidney injury complicating stage II chronic kidney disease: Creatinine was normal/1.07 on 04/19/17.  This is gradually trended up to 1.37.  Likely due to IV Lasix and newly started lisinopril.  Transitioned Lasix to oral.  Discontinued lisinopril at discharge.  Follow BMP in a few days as outpatient. 7. Dizziness and giddiness: Appears to be chronic.  Patient denies these complaints today.  No headache, visual symptoms or strokelike symptoms reported.  CT head without acute findings.  May have been related to hypertensive urgency and alcohol abuse.  MRI brain 1/30: No convincing areas of acute infarction.  Detailed report as below.  PT evaluated and patient refused home health services or SNF. 8. Alcohol dependence: Does not exhibit any overt withdrawal symptoms.  No CIWA's were done until 1/29 afternoon and since then readings of 10, 17 and at noon 9 noted.  Abstinence counseled.  Continue thiamine, multivitamin and folate.  Patient probably still at risk for significant withdrawal and its complications.  This was discussed in detail with patient.  He refuses to stay any further in the hospital and insists on going home.  He has gone up and down to the nurses station several  times insisting on being discharged.  Although he may have some cognitive impairment, he has capacity to make medical decisions but is making poor judgment.  I was  unable to reach his spouse on the phone number available to try and enlist her assistance.  Patient was advised to seek immediate medical attention if he developed any worsening of his condition upon returning home. 9. Tobacco abuse: Cessation counseled. 10. Microcytic anemia: Hemoglobin seems to have been gradually dropping since mid 2018.  Hemoglobin was 8.5 in mid December 2018.  No overt bleeding or melena reported.  Recommend close outpatient follow-up and further evaluation including GI screening if not already performed. 11. Bedbugs: Treated per hospital policy.   Consultations:  None  Procedures:  None   Discharge Instructions  Discharge Instructions    (HEART FAILURE PATIENTS) Call MD:  Anytime you have any of the following symptoms: 1) 3 pound weight gain in 24 hours or 5 pounds in 1 week 2) shortness of breath, with or without a dry hacking cough 3) swelling in the hands, feet or stomach 4) if you have to sleep on extra pillows at night in order to breathe.   Complete by:  As directed    Call MD for:  difficulty breathing, headache or visual disturbances   Complete by:  As directed    Call MD for:  extreme fatigue   Complete by:  As directed    Call MD for:  persistant dizziness or light-headedness   Complete by:  As directed    Diet - low sodium heart healthy   Complete by:  As directed    Increase activity slowly   Complete by:  As directed        Medication List    STOP taking these medications   cloNIDine 0.2 MG tablet Commonly known as:  CATAPRES   ondansetron 4 MG disintegrating tablet Commonly known as:  ZOFRAN ODT     TAKE these medications   albuterol 108 (90 Base) MCG/ACT inhaler Commonly known as:  PROVENTIL HFA;VENTOLIN HFA Inhale 2 puffs into the lungs every 6 (six) hours as needed for wheezing or shortness of breath.   amLODipine 5 MG tablet Commonly known as:  NORVASC Take 1 tablet (5 mg total) by mouth daily.   carvedilol 25 MG  tablet Commonly known as:  COREG Take 1 tablet (25 mg total) by mouth 2 (two) times daily with a meal. What changed:    medication strength  how much to take   folic acid 1 MG tablet Commonly known as:  FOLVITE Take 1 tablet (1 mg total) by mouth daily. Start taking on:  04/23/2017   furosemide 40 MG tablet Commonly known as:  LASIX Take 1 tablet (40 mg total) by mouth daily.   hydrALAZINE 50 MG tablet Commonly known as:  APRESOLINE Take 1 tablet (50 mg total) by mouth 3 (three) times daily. What changed:    medication strength  how much to take  when to take this   multivitamin with minerals Tabs tablet Take 1 tablet by mouth daily. Start taking on:  04/23/2017   pantoprazole 40 MG tablet Commonly known as:  PROTONIX Take 1 tablet (40 mg total) by mouth daily.   potassium chloride SA 20 MEQ tablet Commonly known as:  K-DUR,KLOR-CON Take 1 tablet (20 mEq total) by mouth daily.   thiamine 100 MG tablet Take 1 tablet (100 mg total) by mouth daily. Start taking on:  04/23/2017  Follow-up Information    Dr. Martha Clan. Go to.   Why:  Your appointment is for Monday, April 27, 2017 at 1:30pm.  To be seen with repeat labs (CBC, BMP & Mg). Contact information: [address] [place], [place] [address] [phone]         No Known Allergies    Procedures/Studies: Ct Head Wo Contrast  Result Date: 04/19/2017 CLINICAL DATA:  Dizziness EXAM: CT HEAD WITHOUT CONTRAST TECHNIQUE: Contiguous axial images were obtained from the base of the skull through the vertex without intravenous contrast. COMPARISON:  02/28/2017 FINDINGS: Brain: Chronic atrophic and ischemic changes are again identified and stable. No findings to suggest acute hemorrhage, acute infarction or space-occupying mass lesion are noted. Vascular: No hyperdense vessel or unexpected calcification. Skull: Normal. Negative for fracture or focal lesion. Sinuses/Orbits: No acute finding. Other: None.  IMPRESSION: Chronic atrophic and ischemic changes.  No acute abnormality noted. Electronically Signed   By: Alcide Clever M.D.   On: 04/19/2017 08:44   Mr Brain Wo Contrast  Result Date: 04/22/2017 CLINICAL DATA:  Vertigo. Poorly controlled hypertension. Alcohol abuse. Tobacco abuse. Shortness of breath, dizziness, and nausea. EXAM: MRI HEAD WITHOUT CONTRAST TECHNIQUE: Multiplanar, multiecho pulse sequences of the brain and surrounding structures were obtained without intravenous contrast. COMPARISON:  Most recent CT head 04/19/2017. Most recent MR 05/13/2016. FINDINGS: The patient was unable to remain motionless for the exam. Small or subtle lesions could be overlooked. Brain: No evidence for acute infarction, hemorrhage, mass lesion, or extra-axial fluid. Advanced atrophy for age. Hydrocephalus ex vacuo. Extensive T2 and FLAIR hyperintensities throughout the periventricular and subcortical white matter, focal and confluent, consistent with advanced small vessel disease. 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 Vascular: Normal flow voids. Skull and upper cervical spine: Normal marrow signal. Sinuses/Orbits: Negative. Other: None. Compared with priors, a LEFT thalamic lacunar infarct was subacute in 2018. IMPRESSION: Motion degraded exam demonstrating no convincing areas of acute infarction. There are changes of advanced atrophy, hydrocephalus ex vacuo, and extensive ischemic demyelination of the white matter. Electronically Signed   By: Elsie Stain M.D.   On: 04/22/2017 16:45   Dg Chest Portable 1 View  Result Date: 04/19/2017 CLINICAL DATA:  Pain dizziness and elevated BP EXAM: PORTABLE CHEST 1 VIEW COMPARISON:  CT chest 02/28/2017, radiograph 10/06/2015 FINDINGS: The heart size and mediastinal contours are within normal limits. Aortic atherosclerosis. Both lungs are clear. The visualized skeletal structures are unremarkable. IMPRESSION: No active  disease. Electronically Signed   By: Jasmine Pang M.D.   On: 04/19/2017 02:23      Subjective: As per RN, patient has packed up his bag and ready to leave since 6 AM this morning.  The patient denies complaints.  No chest pain, dyspnea, palpitations, dizziness, lightheadedness or anxiety reported.  As per nursing, he has gone up and down several times to the nurses station insisting on going home.  Discharge Exam:  Vitals:   04/21/17 1300 04/21/17 1400 04/21/17 1700 04/22/17 1001  BP: (!) 166/79 (!) 166/79 (!) 160/94 (!) 133/94  Pulse: 73 75  84  Resp: 18   20  Temp: 97.8 F (36.6 C)   97.7 F (36.5 C)  TempSrc: Oral   Axillary  SpO2: 99%   100%  Weight:      Height:        General: Middle-aged male, moderately built, poorly nourished, sitting up comfortably at edge of bed eating crackers and does not appear in any  distress.  Does not appear ataxic. Cardiovascular: S1 & S2 heard, RRR, S1/S2 +. No murmurs, rubs, gallops or clicks. No JVD or pedal edema.  Telemetry: Sinus rhythm. Respiratory: Clear to auscultation anteriorly.  Occasional basal crackles.  Occasional rhonchi posteriorly.  No increased work of breathing. Abdominal:  Non distended, non tender & soft. No organomegaly or masses appreciated. Normal bowel sounds heard. CNS: Alert and oriented to person, place and partly to time. No focal deficits. Extremities: no edema, no cyanosis.  No tremors noted.    The results of significant diagnostics from this hospitalization (including imaging, microbiology, ancillary and laboratory) are listed below for reference.      Labs: CBC: Recent Labs  Lab 04/19/17 0236 04/19/17 1145 04/20/17 0626  WBC 3.6* 3.4* 9.3  NEUTROABS 1.4*  --   --   HGB 9.6* 9.6* 8.0*  HCT 29.7* 30.1* 25.2*  MCV 75.0* 74.5* 75.2*  PLT 316 341 252   Basic Metabolic Panel: Recent Labs  Lab 04/19/17 0236 04/19/17 1145 04/20/17 0626 04/21/17 0633 04/22/17 0636  NA 135  --  134* 137 138  K  3.3*  --  3.7 3.4* 3.6  CL 105  --  107 102 103  CO2 21*  --  20* 27 23  GLUCOSE 89  --  113* 86 107*  BUN 10  --  21* 14 20  CREATININE 1.07 1.25* 1.26* 1.20 1.37*  CALCIUM 8.7*  --  8.5* 8.7* 9.4  MG  --  2.1  --  1.6*  --    Liver Function Tests: Recent Labs  Lab 04/19/17 0236 04/20/17 0626  AST 72* 47*  ALT 58 43  ALKPHOS 76 66  BILITOT 0.5 0.4  PROT 7.4 6.4*  ALBUMIN 3.2* 2.8*   BNP (last 3 results) Recent Labs    04/20/17 0834  BNP 993.5*       Time coordinating discharge: Over 30 minutes  SIGNED:  Marcellus Scott, MD, FACP, [facility]. [facility] Pager [phone]  If 7PM-7AM, please contact night-coverage [email or web address] Password [email or web address] 04/22/2017, 5:22 PM

## 2017-04-22 NOTE — Progress Notes (Signed)
Pt has discharge order written at 1727 after page sent to attending regarding  MRI result. Walked down the hall to the nursing station with packed  Belongings since morning while this RN admitting new patient. Charge Nurse at the desk witnessing patient leaving obtained signed AMA. Dr Waymon AmatoHongalgi paged and made aware of the situation. Jedi Catalfamo Ruthe MannanMawule Dorissa Stinnette, BSN, RN

## 2017-04-22 NOTE — Evaluation (Signed)
Physical Therapy Evaluation Patient Details Name: Austin Mendez MRN: [id number] DOB: 1960-11-27 Today's Date: 04/22/2017   History of Present Illness  Pt is a 57 y/o male admitted secondary to worsening SOB and HTN. CT negative for acute abnormality. Awaiting MRI. PMH includes CVA, HTN, dCHF, alcohol abuse, and tobacco abuse.   Clinical Impression  Pt admitted secondary to problem above with deficits below. Pt presenting with cognitive deficits and decreased balance during session, however, pt reports he is at his baseline. Required supervision for safety for mobility as pt with safety awareness deficits. Feel pt would benefit from 24/7 assist given current safety and cognitive deficits, however, pt adamantly refusing SNF. Conflicting information given about support at home, however, pt adamant on going home. Also refused any other follow up HHPT. Will benefit from acute PT to increase independence and safety with mobility.     Follow Up Recommendations No PT follow up;Supervision/Assistance - 24 hour(Pt adamantly refusing SNF or HHPT)    Equipment Recommendations  None recommended by PT    Recommendations for Other Services OT consult     Precautions / Restrictions Precautions Precautions: Fall Restrictions Weight Bearing Restrictions: No      Mobility  Bed Mobility               General bed mobility comments: Pt up ambulating in halls when PT arrived to floor.   Transfers Overall transfer level: Needs assistance Equipment used: None Transfers: Sit to/from Stand Sit to Stand: Supervision         General transfer comment: Supervision for safety. Increased time to perform.   Ambulation/Gait Ambulation/Gait assistance: Supervision Ambulation Distance (Feet): 100 Feet Assistive device: None Gait Pattern/deviations: Step-through pattern;Decreased stride length;Decreased weight shift to left Gait velocity: Decreased  Gait velocity interpretation: Below normal  speed for age/gender General Gait Details: Slow, slightly unsteady gait, however, no overt LOB noted. Pt reports he is at his baseline for ambulation. Decreased weightshift to the L noted during gait. Educated about using RW at home to increase stability, however, pt will likely not use.   Stairs            Wheelchair Mobility    Modified Rankin (Stroke Patients Only)       Balance Overall balance assessment: Needs assistance Sitting-balance support: No upper extremity supported;Feet supported Sitting balance-Leahy Scale: Good Sitting balance - Comments: Able to reach down to floor to pick up food in sitting without LOB.    Standing balance support: No upper extremity supported;During functional activity Standing balance-Leahy Scale: Fair                               Pertinent Vitals/Pain Pain Assessment: No/denies pain    Home Living Family/patient expects to be discharged to:: Private residence Living Arrangements: Alone Available Help at Discharge: Family;Available PRN/intermittently Type of Home: House Home Access: Stairs to enter Entrance Stairs-Rails: Left Entrance Stairs-Number of Steps: 2 Home Layout: One level Home Equipment: Walker - 2 wheels;Shower seat      Prior Function Level of Independence: Independent               Hand Dominance        Extremity/Trunk Assessment   Upper Extremity Assessment Upper Extremity Assessment: Defer to OT evaluation    Lower Extremity Assessment Lower Extremity Assessment: LLE deficits/detail LLE Deficits / Details: LLE weakness secondary to CVA.     Cervical / Trunk Assessment Cervical /  Trunk Assessment: Normal  Communication   Communication: Other (comment)(slurred speech )  Cognition Arousal/Alertness: Awake/alert Behavior During Therapy: WFL for tasks assessed/performed Overall Cognitive Status: No family/caregiver present to determine baseline cognitive functioning                                  General Comments: No family present. Upon entry, pt trying to put dinner tray into sharps container. When asked if he was trying to throw it away, he said he was trying to save it for later. Pt also reporting different information to PT, RN, and case management about assist able to be provided at home.       General Comments General comments (skin integrity, edema, etc.): Educated about need for 24/7 assist. Pt adamantly refusing SNF and any follow up PT and said he will be ok. Does reports some family able to check on him, however, unsure of accuracy given pt's cognitive deficits.     Exercises Other Exercises Other Exercises: Practiced marching in place to ensure adequate step height for stair management.    Assessment/Plan    PT Assessment Patient needs continued PT services  PT Problem List Decreased strength;Decreased balance;Decreased mobility;Decreased cognition;Decreased knowledge of use of DME;Decreased safety awareness;Decreased knowledge of precautions       PT Treatment Interventions DME instruction;Gait training;Stair training;Functional mobility training;Therapeutic activities;Therapeutic exercise;Balance training;Neuromuscular re-education;Patient/family education    PT Goals (Current goals can be found in the Care Plan section)  Acute Rehab PT Goals Patient Stated Goal: to go home  PT Goal Formulation: With patient Time For Goal Achievement: 05/06/17 Potential to Achieve Goals: Fair    Frequency Min 3X/week   Barriers to discharge Other (comment) Unsure of caregiver assist at home     Co-evaluation               AM-PAC PT "6 Clicks" Daily Activity  Outcome Measure Difficulty turning over in bed (including adjusting bedclothes, sheets and blankets)?: A Little Difficulty moving from lying on back to sitting on the side of the bed? : A Little Difficulty sitting down on and standing up from a chair with arms (e.g., wheelchair,  bedside commode, etc,.)?: A Little Help needed moving to and from a bed to chair (including a wheelchair)?: A Little Help needed walking in hospital room?: A Little Help needed climbing 3-5 steps with a railing? : A Little 6 Click Score: 18    End of Session Equipment Utilized During Treatment: Gait belt Activity Tolerance: Patient tolerated treatment well Patient left: in bed;with call bell/phone within reach Nurse Communication: Mobility status PT Visit Diagnosis: Unsteadiness on feet (R26.81);Other abnormalities of gait and mobility (R26.89);Muscle weakness (generalized) (M62.81)    Time: [id number] PT Time Calculation (min) (ACUTE ONLY): 15 min   Charges:   PT Evaluation $PT Eval Low Complexity: 1 Low     PT G Codes:        Gladys DammeBrittany Zackery Brine, PT, DPT  Acute Rehabilitation Services  Pager: [phone]   Lehman PromBrittany S Trinity Hyland 04/22/2017, 2:02 PM

## 2017-04-22 NOTE — Care Management Note (Signed)
Case Management Note  Patient Details  Name: Austin RoughWilliam D Mendez MRN: [id number] Date of Birth: 1960/05/15  Subjective/Objective:    57 yr old male admitted with SOB, essential hypertension.                Action/Plan:  Case manager has made follow up appointment for patient to have labs drawn at Primary MD office. Appointment is for April 27, 2017 at 1:30pm.    Expected Discharge Date:    04/21/17              Expected Discharge Plan:  Home/Self Care  In-House Referral:  NA  Discharge planning Services  CM Consult, Follow-up appt scheduled  Post Acute Care Choice:  NA Choice offered to:  NA  DME Arranged:  N/A DME Agency:  NA  HH Arranged:  NA HH Agency:  NA  Status of Service:  Completed, signed off  If discussed at Long Length of Stay Meetings, dates discussed:    Additional Comments:  Austin Mendez, Austin Feldpausch Naomi, RN 04/22/2017, 10:36 AM

## 2017-04-22 NOTE — Care Management (Signed)
Patient has no Home Health needs, will need follow up hospital  appointment . Case manager contacted patient's primary Dr.'s office: Dr. Martha ClanWilliam Shaw, spoke with Tresa EndoKelly, appointment is scheduled for Monday, April 27, 2017 at 1:30pm. CM will inform patient of this and enter on discharge.

## 2017-04-22 NOTE — Discharge Instructions (Signed)
Please get your medications reviewed and adjusted by your Primary MD. ° °Please request your Primary MD to go over all Hospital Tests and Procedure/Radiological results at the follow up, please get all Hospital records sent to your Prim MD by signing hospital release before you go home. ° °If you had Pneumonia of Lung problems at the Hospital: °Please get a 2 view Chest X ray done in 6-8 weeks after hospital discharge or sooner if instructed by your Primary MD. ° °If you have Congestive Heart Failure: °Please call your Cardiologist or Primary MD anytime you have any of the following symptoms:  °1) 3 pound weight gain in 24 hours or 5 pounds in 1 week  °2) shortness of breath, with or without a dry hacking cough  °3) swelling in the hands, feet or stomach  °4) if you have to sleep on extra pillows at night in order to breathe ° °Follow cardiac low salt diet and 1.5 lit/day fluid restriction. ° °If you have diabetes °Accuchecks 4 times/day, Once in AM empty stomach and then before each meal. °Log in all results and show them to your primary doctor at your next visit. °If any glucose reading is under 80 or above 300 call your primary MD immediately. ° °If you have Seizure/Convulsions/Epilepsy: °Please do not drive, operate heavy machinery, participate in activities at heights or participate in high speed sports until you have seen by Primary MD or a Neurologist and advised to do so again. ° °If you had Gastrointestinal Bleeding: °Please ask your Primary MD to check a complete blood count within one week of discharge or at your next visit. Your endoscopic/colonoscopic biopsies that are pending at the time of discharge, will also need to followed by your Primary MD. ° °Get Medicines reviewed and adjusted. °Please take all your medications with you for your next visit with your Primary MD ° °Please request your Primary MD to go over all hospital tests and procedure/radiological results at the follow up, please ask your  Primary MD to get all Hospital records sent to his/her office. ° °If you experience worsening of your admission symptoms, develop shortness of breath, life threatening emergency, suicidal or homicidal thoughts you must seek medical attention immediately by calling 911 or calling your MD immediately  if symptoms less severe. ° °You must read complete instructions/literature along with all the possible adverse reactions/side effects for all the Medicines you take and that have been prescribed to you. Take any new Medicines after you have completely understood and accpet all the possible adverse reactions/side effects.  ° °Do not drive or operate heavy machinery when taking Pain medications.  ° °Do not take more than prescribed Pain, Sleep and Anxiety Medications ° °Special Instructions: If you have smoked or chewed Tobacco  in the last 2 yrs please stop smoking, stop any regular Alcohol  and or any Recreational drug use. ° °Wear Seat belts while driving. ° °Please note °You were cared for by a hospitalist during your hospital stay. If you have any questions about your discharge medications or the care you received while you were in the hospital after you are discharged, you can call the unit and asked to speak with the hospitalist on call if the hospitalist that took care of you is not available. Once you are discharged, your primary care physician will handle any further medical issues. Please note that NO REFILLS for any discharge medications will be authorized once you are discharged, as it is imperative that you   return to your primary care physician (or establish a relationship with a primary care physician if you do not have one) for your aftercare needs so that they can reassess your need for medications and monitor your lab values.  You can reach the hospitalist office at phone [phone] or fax [phone]   If you do not have a primary care physician, you can call [phone] for a physician  referral.   Heart Failure Heart failure means your heart has trouble pumping blood. This makes it hard for your body to work well. Heart failure is usually a long-term (chronic) condition. You must take good care of yourself and follow your doctor's treatment plan. Follow these instructions at home:  Take your heart medicine as told by your doctor. ? Do not stop taking medicine unless your doctor tells you to. ? Do not skip any dose of medicine. ? Refill your medicines before they run out. ? Take other medicines only as told by your doctor or pharmacist.  Stay active if told by your doctor. The elderly and people with severe heart failure should talk with a doctor about physical activity.  Eat heart-healthy foods. Choose foods that are without trans fat and are low in saturated fat, cholesterol, and salt (sodium). This includes fresh or frozen fruits and vegetables, fish, lean meats, fat-free or low-fat dairy foods, whole grains, and high-fiber foods. Lentils and dried peas and beans (legumes) are also good choices.  Limit salt if told by your doctor.  Cook in a healthy way. Roast, grill, broil, bake, poach, steam, or stir-fry foods.  Limit fluids as told by your doctor.  Weigh yourself every morning. Do this after you pee (urinate) and before you eat breakfast. Write down your weight to give to your doctor.  Take your blood pressure and write it down if your doctor tells you to.  Ask your doctor how to check your pulse. Check your pulse as told.  Lose weight if told by your doctor.  Stop smoking or chewing tobacco. Do not use gum or patches that help you quit without your doctor's approval.  Schedule and go to doctor visits as told.  Nonpregnant women should have no more than 1 drink a day. Men should have no more than 2 drinks a day. Talk to your doctor about drinking alcohol.  Stop illegal drug use.  Stay current with shots (immunizations).  Manage your health conditions  as told by your doctor.  Learn to manage your stress.  Rest when you are tired.  If it is really hot outside: ? Avoid intense activities. ? Use air conditioning or fans, or get in a cooler place. ? Avoid caffeine and alcohol. ? Wear loose-fitting, lightweight, and light-colored clothing.  If it is really cold outside: ? Avoid intense activities. ? Layer your clothing. ? Wear mittens or gloves, a hat, and a scarf when going outside. ? Avoid alcohol.  Learn about heart failure and get support as needed.  Get help to maintain or improve your quality of life and your ability to care for yourself as needed. Contact a doctor if:  You gain weight quickly.  You are more short of breath than usual.  You cannot do your normal activities.  You tire easily.  You cough more than normal, especially with activity.  You have any or more puffiness (swelling) in areas such as your hands, feet, ankles, or belly (abdomen).  You cannot sleep because it is hard to breathe.  You feel like  your heart is beating fast (palpitations).  You get dizzy or light-headed when you stand up. Get help right away if:  You have trouble breathing.  There is a change in mental status, such as becoming less alert or not being able to focus.  You have chest pain or discomfort.  You faint. This information is not intended to replace advice given to you by your health care provider. Make sure you discuss any questions you have with your health care provider. Document Released: 12/18/2007 Document Revised: 08/16/2015 Document Reviewed: 04/26/2012 Elsevier Interactive Patient Education  2017 [employer]   Chronic Obstructive Pulmonary Disease Chronic obstructive pulmonary disease (COPD) is a long-term (chronic) lung problem. When you have COPD, it is hard for air to get in and out of your lungs. The way your lungs work will never return to normal. Usually the condition gets worse over time. There are  things you can do to keep yourself as healthy as possible. Your doctor may treat your condition with:  Medicines.  Quitting smoking, if you smoke.  Rehabilitation. This may involve a team of specialists.  Oxygen.  Exercise and changes to your diet.  Lung surgery.  Comfort measures (palliative care).  Follow these instructions at home: Medicines  Take over-the-counter and prescription medicines only as told by your doctor.  Talk to your doctor before taking any cough or allergy medicines. You may need to avoid medicines that cause your lungs to be dry. Lifestyle  If you smoke, stop. Smoking makes the problem worse. If you need help quitting, ask your doctor.  Avoid being around things that make your breathing worse. This may include smoke, chemicals, and fumes.  Stay active, but remember to also rest.  Learn and use tips on how to relax.  Make sure you get enough sleep. Most adults need at least 7 hours a night.  Eat healthy foods. Eat smaller meals more often. Rest before meals. Controlled breathing  Learn and use tips on how to control your breathing as told by your doctor. Try: ? Breathing in (inhaling) through your nose for 1 second. Then, pucker your lips and breath out (exhale) through your lips for 2 seconds. ? Putting one hand on your belly (abdomen). Breathe in slowly through your nose for 1 second. Your hand on your belly should move out. Pucker your lips and breathe out slowly through your lips. Your hand on your belly should move in as you breathe out. Controlled coughing  Learn and use controlled coughing to clear mucus from your lungs. The steps are: 1. Lean your head a little forward. 2. Breathe in deeply. 3. Try to hold your breath for 3 seconds. 4. Keep your mouth slightly open while coughing 2 times. 5. Spit any mucus out into a tissue. 6. Rest and do the steps again 1 or 2 times as needed. General instructions  Make sure you get all the shots  (vaccines) that your doctor recommends. Ask your doctor about a flu shot and a pneumonia shot.  Use oxygen therapy and therapy to help improve your lungs (pulmonary rehabilitation) if told by your doctor. If you need home oxygen therapy, ask your doctor if you should buy a tool to measure your oxygen level (oximeter).  Make a COPD action plan with your doctor. This helps you know what to do if you feel worse than usual.  Manage any other conditions you have as told by your doctor.  Avoid going outside when it is very hot, cold,  or humid.  Avoid people who have a sickness you can catch (contagious).  Keep all follow-up visits as told by your doctor. This is important. Contact a doctor if:  You cough up more mucus than usual.  There is a change in the color or thickness of the mucus.  It is harder to breathe than usual.  Your breathing is faster than usual.  You have trouble sleeping.  You need to use your medicines more often than usual.  You have trouble doing your normal activities such as getting dressed or walking around the house. Get help right away if:  You have shortness of breath while resting.  You have shortness of breath that stops you from: ? Being able to talk. ? Doing normal activities.  Your chest hurts for longer than 5 minutes.  Your skin color is more blue than usual.  Your pulse oximeter shows that you have low oxygen for longer than 5 minutes.  You have a fever.  You feel too tired to breathe normally. Summary  Chronic obstructive pulmonary disease (COPD) is a long-term lung problem.  The way your lungs work will never return to normal. Usually the condition gets worse over time. There are things you can do to keep yourself as healthy as possible.  Take over-the-counter and prescription medicines only as told by your doctor.  If you smoke, stop. Smoking makes the problem worse. This information is not intended to replace advice given to you by  your health care provider. Make sure you discuss any questions you have with your health care provider. Document Released: 08/27/2007 Document Revised: 08/16/2015 Document Reviewed: 11/04/2012 Elsevier Interactive Patient Education  2017 [employer].

## 2017-04-22 NOTE — Progress Notes (Signed)
PT Cancellation Note  Patient Details Name: Austin Mendez MRN: [id number] DOB: February 13, 1961   Cancelled Treatment:    Reason Eval/Treat Not Completed: Patient at procedure or test/unavailable Transport staff present to take pt for MRI. Will follow up as schedule allows.   Gladys DammeBrittany Nadalee Neiswender, PT, DPT  Acute Rehabilitation Services  Pager: [phone]  Lehman PromBrittany S Azzie Thiem 04/22/2017, 12:34 PM

## 2017-06-24 ENCOUNTER — Encounter: Payer: Self-pay | Admitting: Internal Medicine

## 2017-10-28 ENCOUNTER — Emergency Department (HOSPITAL_COMMUNITY)
Admission: EM | Admit: 2017-10-28 | Discharge: 2017-10-28 | Disposition: A | Discharge status: Home / Self Care | Payer: Medicaid Other | Attending: Emergency Medicine | Admitting: Emergency Medicine

## 2017-10-28 ENCOUNTER — Encounter (HOSPITAL_COMMUNITY): Payer: Self-pay | Admitting: Emergency Medicine

## 2017-10-28 DIAGNOSIS — F10129 Alcohol abuse with intoxication, unspecified: Secondary | ICD-10-CM | POA: Insufficient documentation

## 2017-10-28 DIAGNOSIS — I110 Hypertensive heart disease with heart failure: Secondary | ICD-10-CM | POA: Insufficient documentation

## 2017-10-28 DIAGNOSIS — Z79899 Other long term (current) drug therapy: Secondary | ICD-10-CM | POA: Diagnosis not present

## 2017-10-28 DIAGNOSIS — F10920 Alcohol use, unspecified with intoxication, uncomplicated: Secondary | ICD-10-CM

## 2017-10-28 DIAGNOSIS — I5030 Unspecified diastolic (congestive) heart failure: Secondary | ICD-10-CM | POA: Insufficient documentation

## 2017-10-28 DIAGNOSIS — F17210 Nicotine dependence, cigarettes, uncomplicated: Secondary | ICD-10-CM | POA: Insufficient documentation

## 2017-10-28 DIAGNOSIS — I10 Essential (primary) hypertension: Secondary | ICD-10-CM | POA: Diagnosis present

## 2017-10-28 DIAGNOSIS — R42 Dizziness and giddiness: Secondary | ICD-10-CM | POA: Insufficient documentation

## 2017-10-28 LAB — CBC WITH DIFFERENTIAL/PLATELET
Abs Immature Granulocytes: 0 10*3/uL (ref 0.0–0.1)
BASOS ABS: 0 10*3/uL (ref 0.0–0.1)
BASOS PCT: 1 %
Eosinophils Absolute: 0.1 10*3/uL (ref 0.0–0.7)
Eosinophils Relative: 3 %
HCT: 35.3 % — ABNORMAL LOW (ref 39.0–52.0)
Hemoglobin: 11.6 g/dL — ABNORMAL LOW (ref 13.0–17.0)
IMMATURE GRANULOCYTES: 0 %
LYMPHS ABS: 1.4 10*3/uL (ref 0.7–4.0)
Lymphocytes Relative: 44 %
MCH: 27.7 pg (ref 26.0–34.0)
MCHC: 32.9 g/dL (ref 30.0–36.0)
MCV: 84.2 fL (ref 78.0–100.0)
Monocytes Absolute: 0.4 10*3/uL (ref 0.1–1.0)
Monocytes Relative: 11 %
NEUTROS PCT: 41 %
Neutro Abs: 1.3 10*3/uL — ABNORMAL LOW (ref 1.7–7.7)
Platelets: 281 10*3/uL (ref 150–400)
RBC: 4.19 MIL/uL — AB (ref 4.22–5.81)
RDW: 18 % — AB (ref 11.5–15.5)
WBC: 3.2 10*3/uL — AB (ref 4.0–10.5)

## 2017-10-28 LAB — BASIC METABOLIC PANEL
ANION GAP: 12 (ref 5–15)
BUN: <5 mg/dL — ABNORMAL LOW (ref 6–20)
CALCIUM: 8.5 mg/dL — AB (ref 8.9–10.3)
CO2: 23 mmol/L (ref 22–32)
Chloride: 101 mmol/L (ref 98–111)
Creatinine, Ser: 0.92 mg/dL (ref 0.61–1.24)
GFR calc non Af Amer: >60 mL/min (ref >60)
Glucose, Bld: 84 mg/dL (ref 70–99)
Potassium: 3 mmol/L — ABNORMAL LOW (ref 3.5–5.1)
SODIUM: 136 mmol/L (ref 135–145)

## 2017-10-28 LAB — ETHANOL: ALCOHOL ETHYL (B): 184 mg/dL — AB (ref <10)

## 2017-10-28 LAB — I-STAT TROPONIN, ED: Troponin i, poc: 0 ng/mL (ref 0.00–0.08)

## 2017-10-28 MED ORDER — HYDRALAZINE HCL 25 MG PO TABS
50.0000 mg | ORAL_TABLET | Freq: Once | ORAL | Status: AC
  Administered 2017-10-28: 50 mg via ORAL
  Filled 2017-10-28: qty 2

## 2017-10-28 MED ORDER — HYDRALAZINE HCL 20 MG/ML IJ SOLN
10.0000 mg | Freq: Once | INTRAMUSCULAR | Status: AC
  Administered 2017-10-28: 10 mg via INTRAVENOUS
  Filled 2017-10-28: qty 1

## 2017-10-28 MED ORDER — SODIUM CHLORIDE 0.9 % IV BOLUS
1000.0000 mL | Freq: Once | INTRAVENOUS | Status: AC
  Administered 2017-10-28: 1000 mL via INTRAVENOUS

## 2017-10-28 MED ORDER — CARVEDILOL 12.5 MG PO TABS
25.0000 mg | ORAL_TABLET | Freq: Two times a day (BID) | ORAL | Status: DC
  Administered 2017-10-28: 25 mg via ORAL
  Filled 2017-10-28 (×2): qty 2

## 2017-10-28 MED ORDER — LABETALOL HCL 5 MG/ML IV SOLN
10.0000 mg | Freq: Once | INTRAVENOUS | Status: AC
  Administered 2017-10-28: 10 mg via INTRAVENOUS
  Filled 2017-10-28: qty 4

## 2017-10-28 MED ORDER — AMLODIPINE BESYLATE 5 MG PO TABS
5.0000 mg | ORAL_TABLET | Freq: Once | ORAL | Status: AC
  Administered 2017-10-28: 5 mg via ORAL
  Filled 2017-10-28: qty 1

## 2017-10-28 NOTE — Discharge Instructions (Addendum)
He was seen today for high blood pressure, dizziness.  You also had alcohol intoxication.  You need to make sure to take your blood pressure medications daily.  Your dizziness is likely worsened from your alcohol intoxication.  Follow-up closely with your primary doctor.

## 2017-10-28 NOTE — ED Provider Notes (Signed)
[facility] EMERGENCY DEPARTMENT Provider Note   CSN: [id number] Arrival date & time: 10/28/17  0247     History   Chief Complaint Chief Complaint  Patient presents with  . Hypertension    HPI Austin Mendez is a 57 y.o. male.  HPI  This is a 57 year old male with a history of hypertension and shortness of breath who presents with dizziness.  Patient states that he feels like his blood pressure is high.  Reports lightheadedness.  Denies room spinning dizziness.  Pressure by EMS reported to be 230/130.  He does report drinking alcohol tonight.  He states "I just drank 2 beers."  Denies any headache.  Denies any chest pain or shortness of breath.  Denies any recent illnesses or fevers.  Reports that he has been taking his blood pressure medication but "I could not take it last night."  Past Medical History:  Diagnosis Date  . Dizziness and giddiness   . Hypertension   . Shortness of breath   . Syncope and collapse   . Unspecified essential hypertension     Patient Active Problem List   Diagnosis Date Noted  . Acute diastolic heart failure (HCC) 04/20/2017  . SOB (shortness of breath) 04/19/2017  . Tachypnea 04/19/2017  . Orthostatic dizziness   . Altered mental status   . AKI (acute kidney injury) (HCC)   . Hypertension   . Noncompliance with treatment   . Tobacco abuse   . Hypomagnesemia 09/07/2015  . Epigastric pain 09/07/2015  . ETOH abuse 09/07/2015  . Near syncope   . Medical non-compliance   . 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 Syncope and collapse 08/02/2012  . Facial fracture due to fall (HCC) 08/02/2012  . Hypokalemia 08/02/2012  . Acute kidney injury (HCC) 08/02/2012  . Syncope 08/02/2012  . Alcohol abuse 04/20/2006  . TOBACCO ABUSE 04/20/2006  . Essential hypertension 04/20/2006    History reviewed. No pertinent surgical history.      Home Medications    Prior to Admission medications   Medication Sig Start Date End  Date Taking? Authorizing Provider  albuterol (PROVENTIL HFA;VENTOLIN HFA) 108 (90 Base) MCG/ACT inhaler Inhale 2 puffs into the lungs every 6 (six) hours as needed for wheezing or shortness of breath. 04/22/17   Hongalgi, Maximino Greenland, MD  amLODipine (NORVASC) 5 MG tablet Take 1 tablet (5 mg total) by mouth daily. 04/22/17 05/22/17  Hongalgi, Maximino Greenland, MD  carvedilol (COREG) 25 MG tablet Take 1 tablet (25 mg total) by mouth 2 (two) times daily with a meal. 04/22/17   Hongalgi, Maximino Greenland, MD  folic acid (FOLVITE) 1 MG tablet Take 1 tablet (1 mg total) by mouth daily. 04/23/17   Hongalgi, Maximino Greenland, MD  furosemide (LASIX) 40 MG tablet Take 1 tablet (40 mg total) by mouth daily. 04/22/17 05/22/17  Hongalgi, Maximino Greenland, MD  hydrALAZINE (APRESOLINE) 50 MG tablet Take 1 tablet (50 mg total) by mouth 3 (three) times daily. 04/22/17   Hongalgi, Maximino Greenland, MD  Multiple Vitamin (MULTIVITAMIN WITH MINERALS) TABS tablet Take 1 tablet by mouth daily. 04/23/17   Hongalgi, Maximino Greenland, MD  pantoprazole (PROTONIX) 40 MG tablet Take 1 tablet (40 mg total) by mouth daily. 03/14/17   Ward, Layla Maw, DO  potassium chloride SA (K-DUR,KLOR-CON) 20 MEQ tablet Take 1 tablet (20 mEq total) by mouth daily. 04/22/17   Hongalgi, Maximino Greenland, MD  thiamine 100 MG tablet Take 1 tablet (100 mg total) by mouth daily. 04/23/17  Elease Etienne, MD    Family History Family History  Problem Relation Age of Onset  . Cancer Mother   . Diabetes Father   . Hypertension Unknown     Social History Social History   Tobacco Use  . Smoking status: Current Every Day Smoker    Packs/day: 10.00    Types: Cigarettes  . Smokeless tobacco: Never Used  Substance Use Topics  . Alcohol use: Yes    Alcohol/week: 0.0 oz    Comment: Smells of ETOH  St's just stopped drinking  . Drug use: Yes    Types: Marijuana     Allergies   Patient has no known allergies.   Review of Systems Review of Systems  Constitutional: Negative for fever.  Respiratory: Negative for  shortness of breath.   Cardiovascular: Negative for chest pain.  Gastrointestinal: Negative for abdominal pain, nausea and vomiting.  Genitourinary: Negative for dysuria.  Neurological: Positive for dizziness. Negative for weakness, numbness and headaches.  All other systems reviewed and are negative.    Physical Exam Updated Vital Signs BP (!) 171/117   Pulse 77   Temp 97.7 F (36.5 C) (Oral)   Resp 19   SpO2 97%   Physical Exam  Constitutional: He is oriented to person, place, and time.  Chronically ill-appearing but nontoxic, appears intoxicated  HENT:  Head: Normocephalic and atraumatic.  Eyes: Pupils are equal, round, and reactive to light. EOM are normal.  Neck: Neck supple.  Cardiovascular: Normal rate, regular rhythm and normal heart sounds.  No murmur heard. Pulmonary/Chest: Effort normal and breath sounds normal. No respiratory distress. He has no wheezes.  Abdominal: Soft. Bowel sounds are normal. There is no tenderness. There is no rebound.  Musculoskeletal: He exhibits no edema.  Neurological: He is alert and oriented to person, place, and time.  Cranial nerves II through XII intact, 5 out of 5 strength in all 4 extremities, no dysmetria to finger-nose-finger  Skin: Skin is warm and dry.  Psychiatric: He has a normal mood and affect.  Nursing note and vitals reviewed.    ED Treatments / Results  Labs (all labs ordered are listed, but only abnormal results are displayed) Labs Reviewed  CBC WITH DIFFERENTIAL/PLATELET - Abnormal; Notable for the following components:      Result Value   WBC 3.2 (*)    RBC 4.19 (*)    Hemoglobin 11.6 (*)    HCT 35.3 (*)    RDW 18.0 (*)    Neutro Abs 1.3 (*)    All other components within normal limits  BASIC METABOLIC PANEL - Abnormal; Notable for the following components:   Potassium 3.0 (*)    BUN <5 (*)    Calcium 8.5 (*)    All other components within normal limits  ETHANOL - Abnormal; Notable for the following  components:   Alcohol, Ethyl (B) 184 (*)    All other components within normal limits  I-STAT TROPONIN, ED    EKG EKG Interpretation  Date/Time:  Wednesday October 28 2017 02:50:59 EDT Ventricular Rate:  75 PR Interval:    QRS Duration: 94 QT Interval:  444 QTC Calculation: 496 R Axis:   70 Text Interpretation:  Sinus rhythm Left ventricular hypertrophy Borderline prolonged QT interval No significant change since last tracing Confirmed by Ross Marcus ([id number]) on 10/28/2017 2:54:32 AM   Radiology No results found.  Procedures Procedures (including critical care time)  Medications Ordered in ED Medications  carvedilol (COREG) tablet 25 mg (has  no administration in time range)  hydrALAZINE (APRESOLINE) injection 10 mg (10 mg Intravenous Given 10/28/17 0335)  amLODipine (NORVASC) tablet 5 mg (5 mg Oral Given 10/28/17 0528)  labetalol (NORMODYNE,TRANDATE) injection 10 mg (10 mg Intravenous Given 10/28/17 0531)  sodium chloride 0.9 % bolus 1,000 mL (0 mLs Intravenous Stopped 10/28/17 0645)  hydrALAZINE (APRESOLINE) tablet 50 mg (50 mg Oral Given 10/28/17 [id number])     Initial Impression / Assessment and Plan / ED Course  I have reviewed the triage vital signs and the nursing notes.  Pertinent labs & imaging results that were available during my care of the patient were reviewed by me and considered in my medical decision making (see chart for details).     Patient presents with high blood pressure and dizziness.  He has a history of the same.  He has had multiple ED evaluations with similar symptoms.  When given blood pressure medications and with downtrending blood pressures, patient's symptoms resolved.  He is currently nonfocal.  He also appears intoxicated.  Reports drinking 2 beers but question whether he drank more.  Also question medication compliance.  Work on blood pressure control and allow patient to metabolize.  Patient's ethanol level was increased.  EKG shows no ischemic  changes.  Troponin is negative.  Doubt hypertensive urgency or emergency at this point.  7:08 AM Pressure trending downward after medications.  Patient was given his home medications as well.  On recheck, still reports some dizziness but is able to ambulate independently and no evidence of ataxia.  Suspect dizziness is likely multifactorial and again not related to true hypertensive urgency or emergency.  Alcohol likely is a contributing factor.  Discharge home.  Encourage patient to take his home blood pressure medications as prescribed.  After history, exam, and medical workup I feel the patient has been appropriately medically screened and is safe for discharge home. Pertinent diagnoses were discussed with the patient. Patient was given return precautions.   Final Clinical Impressions(s) / ED Diagnoses   Final diagnoses:  Essential hypertension  Dizziness  Alcoholic intoxication without complication [facility](HCC)    ED Discharge Orders    None       Shon BatonHorton, Brynleigh Sequeira F, MD 10/28/17 [phone]

## 2017-10-28 NOTE — ED Triage Notes (Signed)
Pt reports hypertension tonight, BP 230/130 with EMS. Reports two beers tonight, states he wants to keep drinking. Found a bed bug on patient.

## 2017-10-28 NOTE — ED Notes (Signed)
Pt tolerated ambulating

## 2017-11-09 ENCOUNTER — Encounter (HOSPITAL_COMMUNITY): Payer: Self-pay | Admitting: Emergency Medicine

## 2017-11-09 ENCOUNTER — Emergency Department (HOSPITAL_COMMUNITY)
Admission: EM | Admit: 2017-11-09 | Discharge: 2017-11-09 | Disposition: A | Discharge status: Home / Self Care | Payer: Medicaid Other | Attending: Emergency Medicine | Admitting: Emergency Medicine

## 2017-11-09 DIAGNOSIS — F17210 Nicotine dependence, cigarettes, uncomplicated: Secondary | ICD-10-CM | POA: Diagnosis not present

## 2017-11-09 DIAGNOSIS — Z79899 Other long term (current) drug therapy: Secondary | ICD-10-CM | POA: Insufficient documentation

## 2017-11-09 DIAGNOSIS — I159 Secondary hypertension, unspecified: Secondary | ICD-10-CM | POA: Diagnosis not present

## 2017-11-09 DIAGNOSIS — I10 Essential (primary) hypertension: Secondary | ICD-10-CM | POA: Insufficient documentation

## 2017-11-09 DIAGNOSIS — R42 Dizziness and giddiness: Secondary | ICD-10-CM | POA: Diagnosis present

## 2017-11-09 LAB — CBC WITH DIFFERENTIAL/PLATELET
Abs Immature Granulocytes: 0 10*3/uL (ref 0.0–0.1)
BASOS PCT: 1 %
Basophils Absolute: 0 10*3/uL (ref 0.0–0.1)
EOS ABS: 0 10*3/uL (ref 0.0–0.7)
Eosinophils Relative: 0 %
HCT: 40.9 % (ref 39.0–52.0)
Hemoglobin: 13.2 g/dL (ref 13.0–17.0)
Immature Granulocytes: 0 %
Lymphocytes Relative: 20 %
Lymphs Abs: 0.7 10*3/uL (ref 0.7–4.0)
MCH: 27.7 pg (ref 26.0–34.0)
MCHC: 32.3 g/dL (ref 30.0–36.0)
MCV: 85.7 fL (ref 78.0–100.0)
MONOS PCT: 11 %
Monocytes Absolute: 0.4 10*3/uL (ref 0.1–1.0)
NEUTROS PCT: 68 %
Neutro Abs: 2.5 10*3/uL (ref 1.7–7.7)
PLATELETS: 238 10*3/uL (ref 150–400)
RBC: 4.77 MIL/uL (ref 4.22–5.81)
RDW: 18.3 % — ABNORMAL HIGH (ref 11.5–15.5)
WBC: 3.6 10*3/uL — ABNORMAL LOW (ref 4.0–10.5)

## 2017-11-09 LAB — BASIC METABOLIC PANEL
ANION GAP: 11 (ref 5–15)
BUN: 9 mg/dL (ref 6–20)
CHLORIDE: 98 mmol/L (ref 98–111)
CO2: 28 mmol/L (ref 22–32)
CREATININE: 1.28 mg/dL — AB (ref 0.61–1.24)
Calcium: 9.3 mg/dL (ref 8.9–10.3)
GFR calc non Af Amer: >60 mL/min (ref >60)
Glucose, Bld: 105 mg/dL — ABNORMAL HIGH (ref 70–99)
POTASSIUM: 3.1 mmol/L — AB (ref 3.5–5.1)
SODIUM: 137 mmol/L (ref 135–145)

## 2017-11-09 LAB — I-STAT TROPONIN, ED: TROPONIN I, POC: 0 ng/mL (ref 0.00–0.08)

## 2017-11-09 MED ORDER — AMLODIPINE BESYLATE 5 MG PO TABS
5.0000 mg | ORAL_TABLET | Freq: Once | ORAL | Status: AC
Start: 2017-11-09 — End: 2017-11-09
  Administered 2017-11-09: 5 mg via ORAL
  Filled 2017-11-09: qty 1

## 2017-11-09 MED ORDER — CARVEDILOL 25 MG PO TABS: 25.0000 mg | ORAL_TABLET | Freq: Two times a day (BID) | ORAL | 0 refills | Status: AC

## 2017-11-09 MED ORDER — HYDRALAZINE HCL 25 MG PO TABS
50.0000 mg | ORAL_TABLET | Freq: Once | ORAL | Status: AC
  Administered 2017-11-09: 50 mg via ORAL
  Filled 2017-11-09: qty 2

## 2017-11-09 MED ORDER — HYDRALAZINE HCL 50 MG PO TABS: 50.0000 mg | ORAL_TABLET | Freq: Three times a day (TID) | ORAL | 0 refills | Status: AC

## 2017-11-09 NOTE — ED Notes (Signed)
Patient verbalizes understanding of discharge instructions. Opportunity for questioning and answers were provided. Armband removed by staff, pt discharged from ED ambulatory with bus pass to bus stop, [place] sandwich and ginger ale in bag with him.

## 2017-11-09 NOTE — ED Notes (Signed)
Pt came in from EMS with multiple bedbugs noticed on pt clothes.

## 2017-11-09 NOTE — ED Triage Notes (Signed)
PT complaining of HTN, headache, dizziness. Took BP meds at 0300 but remains hypertensive. Neuro intact; denies chest pain, sob.

## 2017-11-09 NOTE — ED Provider Notes (Signed)
[facility]   CSN: [id number] Arrival date & time: 11/09/17  1042     History   Chief Complaint Chief Complaint  Patient presents with  . Dizziness  . Headache    HPI Austin Mendez is a 57 y.o. male.  Patient with history of orthostatic dizziness, heart failure, hypertension who presents to the ED with concern for hypertension, intermittent dizziness, intermittent headaches.  Patient states that he has been without his blood pressure medications for the last week or so.  Does not have appointment with primary care doctor for several weeks.  Unable to tell me which medications he is on.  Patient denies chest pain, shortness of breath.  No current symptoms at this time except for some dizziness intermittently.  The history is provided by the patient.  Hypertension  This is a chronic problem. The current episode started more than 2 days ago. The problem occurs daily. The problem has been gradually improving. Associated symptoms include headaches (resolved). Pertinent negatives include no chest pain, no abdominal pain and no shortness of breath. Nothing aggravates the symptoms. Nothing relieves the symptoms. He has tried nothing for the symptoms. The treatment provided no relief.    Past Medical History:  Diagnosis Date  . Dizziness and giddiness   . Hypertension   . Shortness of breath   . Syncope and collapse   . Unspecified essential hypertension     Patient Active Problem List   Diagnosis Date Noted  . Acute diastolic heart failure (HCC) 04/20/2017  . SOB (shortness of breath) 04/19/2017  . Tachypnea 04/19/2017  . Orthostatic dizziness   . Altered mental status   . AKI (acute kidney injury) (HCC)   . Hypertension   . Noncompliance with treatment   . Tobacco abuse   . Hypomagnesemia 09/07/2015  . Epigastric pain 09/07/2015  . ETOH abuse 09/07/2015  . Near syncope   . Medical non-compliance   . 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 Syncope and collapse 08/02/2012  . Facial fracture due to fall (HCC) 08/02/2012  . Hypokalemia 08/02/2012  . Acute kidney injury (HCC) 08/02/2012  . Syncope 08/02/2012  . Alcohol abuse 04/20/2006  . TOBACCO ABUSE 04/20/2006  . Essential hypertension 04/20/2006    History reviewed. No pertinent surgical history.      Home Medications    Prior to Admission medications   Medication Sig Start Date End Date Taking? Authorizing Provider  albuterol (PROVENTIL HFA;VENTOLIN HFA) 108 (90 Base) MCG/ACT inhaler Inhale 2 puffs into the lungs every 6 (six) hours as needed for wheezing or shortness of breath. 04/22/17   Hongalgi, Maximino GreenlandAnand D, MD  amLODipine (NORVASC) 5 MG tablet Take 1 tablet (5 mg total) by mouth daily. 04/22/17 05/22/17  Hongalgi, Maximino GreenlandAnand D, MD  carvedilol (COREG) 25 MG tablet Take 1 tablet (25 mg total) by mouth 2 (two) times daily with a meal. 11/09/17   Brannon Levene, DO  folic acid (FOLVITE) 1 MG tablet Take 1 tablet (1 mg total) by mouth daily. 04/23/17   Hongalgi, Maximino GreenlandAnand D, MD  furosemide (LASIX) 40 MG tablet Take 1 tablet (40 mg total) by mouth daily. 04/22/17 05/22/17  Hongalgi, Maximino GreenlandAnand D, MD  hydrALAZINE (APRESOLINE) 50 MG tablet Take 1 tablet (50 mg total) by mouth 3 (three) times daily. 11/09/17   Shalayna Ornstein, DO  Multiple Vitamin (MULTIVITAMIN WITH MINERALS) TABS tablet Take 1 tablet by mouth daily. 04/23/17   Hongalgi, Maximino GreenlandAnand D, MD  pantoprazole (PROTONIX) 40 MG tablet  Take 1 tablet (40 mg total) by mouth daily. 03/14/17   Ward, Layla Maw, DO  potassium chloride SA (K-DUR,KLOR-CON) 20 MEQ tablet Take 1 tablet (20 mEq total) by mouth daily. 04/22/17   Hongalgi, Maximino Greenland, MD  thiamine 100 MG tablet Take 1 tablet (100 mg total) by mouth daily. 04/23/17   Hongalgi, Maximino Greenland, MD    Family History Family History  Problem Relation Age of Onset  . Cancer Mother   . Diabetes Father   . Hypertension Unknown     Social History Social History   Tobacco Use  .  Smoking status: Current Every Day Smoker    Packs/day: 10.00    Types: Cigarettes  . Smokeless tobacco: Never Used  Substance Use Topics  . Alcohol use: Yes    Alcohol/week: 0.0 standard drinks    Comment: Smells of ETOH  St's just stopped drinking  . Drug use: Yes    Types: Marijuana     Allergies   Patient has no known allergies.   Review of Systems Review of Systems  Constitutional: Negative for chills and fever.  HENT: Negative for ear pain and sore throat.   Eyes: Negative for pain and visual disturbance.  Respiratory: Negative for cough and shortness of breath.   Cardiovascular: Negative for chest pain and palpitations.  Gastrointestinal: Negative for abdominal pain and vomiting.  Genitourinary: Negative for dysuria and hematuria.  Musculoskeletal: Negative for arthralgias and back pain.  Skin: Negative for color change and rash.  Neurological: Positive for dizziness and headaches (resolved). Negative for seizures and syncope.  All other systems reviewed and are negative.    Physical Exam Updated Vital Signs  ED Triage Vitals  Enc Vitals Group     BP 11/09/17 1043 (!) 190/112     Pulse Rate 11/09/17 1043 70     Resp --      Temp 11/09/17 1043 97.6 F (36.4 C)     Temp Source 11/09/17 1043 Oral     SpO2 11/09/17 1044 100 %     Weight --      Height --      Head Circumference --      Peak Flow --      Pain Score --      Pain Loc --      Pain Edu? --      Excl. in GC? --     Physical Exam  Constitutional: He is oriented to person, place, and time. He appears well-developed and well-nourished.  Non-toxic appearance. He does not appear ill.  HENT:  Head: Normocephalic and atraumatic.  Mouth/Throat: Oropharynx is clear and moist.  Eyes: Pupils are equal, round, and reactive to light. Conjunctivae and EOM are normal.  Neck: Normal range of motion. Neck supple.  Cardiovascular: Normal rate, regular rhythm, normal heart sounds and intact distal pulses.  No  murmur heard. Pulmonary/Chest: Effort normal and breath sounds normal. No respiratory distress.  Abdominal: Soft. There is no tenderness.  Musculoskeletal: He exhibits no edema.  Neurological: He is alert and oriented to person, place, and time. He has normal strength. He is not disoriented. No cranial nerve deficit or sensory deficit. Coordination and gait normal.  5+ out of 5 strength throughout, no drift, normal finger-to-nose finger  Skin: Skin is warm and dry.  Psychiatric: He has a normal mood and affect.  Nursing note and vitals reviewed.    ED Treatments / Results  Labs (all labs ordered are listed, but only abnormal results  are displayed) Labs Reviewed  CBC WITH DIFFERENTIAL/PLATELET - Abnormal; Notable for the following components:      Result Value   WBC 3.6 (*)    RDW 18.3 (*)    All other components within normal limits  BASIC METABOLIC PANEL - Abnormal; Notable for the following components:   Potassium 3.1 (*)    Glucose, Bld 105 (*)    Creatinine, Ser 1.28 (*)    All other components within normal limits  I-STAT TROPONIN, ED    EKG EKG Interpretation  Date/Time:  Monday November 09 2017 10:45:41 EDT Ventricular Rate:  75 PR Interval:  134 QRS Duration: 100 QT Interval:  426 QTC Calculation: 475 R Axis:   76 Text Interpretation:  Normal sinus rhythm Left ventricular hypertrophy Abnormal ECG Confirmed by Virgina NorfolkAdam, Nathalee Smarr [phone]([id number]) on 11/09/2017 12:52:56 PM   Radiology No results found.  Procedures Procedures (including critical care time)  Medications Ordered in ED Medications  amLODipine (NORVASC) tablet 5 mg (5 mg Oral Given 11/09/17 1340)  hydrALAZINE (APRESOLINE) tablet 50 mg (50 mg Oral Given 11/09/17 1340)     Initial Impression / Assessment and Plan / ED Course  I have reviewed the triage vital signs and the nursing notes.  Pertinent labs & imaging results that were available during my care of the patient were reviewed by me and considered in my  medical decision making (see chart for details).     Austin Mendez is a 57 year old male with history of hypertension, orthostatic dizziness who presents to the ED with hypertension and intermittent dizziness.  Patient with hypertension upon arrival but otherwise unremarkable vitals.  Patient denies chest pain, shortness of breath.  Continues to state that he has intermittent dizziness.  Denies any fever, chills, abdominal pain.  Patient states that he has been unable to afford his blood pressure medications.  Denies any new trauma, falls, drug or alcohol abuse at this time.  Patient is overall well-appearing.  Has normal neurological exam.  Doubt any intracranial process at this time.  Patient given Coreg and Norvasc for blood pressure.  Basic labs checked to look for any signs of endorgan damage.  EKG showed no signs of ischemic changes.  Unchanged from prior.  Troponin within normal limits and given no chest pain doubt ACS.  Patient otherwise with no significant electrolyte abnormality, anemia, leukocytosis.  Patient with creatinine at baseline.  Patient with improved blood pressure after oral medications.  Case management called to help with assistance for medications and patient discharged from ED in good condition.  Told to return to the ED if symptoms worsen.  Final Clinical Impressions(s) / ED Diagnoses   Final diagnoses:  Secondary hypertension    ED Discharge Orders         Ordered    hydrALAZINE (APRESOLINE) 50 MG tablet  3 times daily     11/09/17 1505    carvedilol (COREG) 25 MG tablet  2 times daily with meals     11/09/17 1505           Christianjames Soule, DO 11/09/17 1534

## 2017-11-24 ENCOUNTER — Emergency Department (HOSPITAL_COMMUNITY): Payer: Medicaid Other

## 2017-11-24 ENCOUNTER — Other Ambulatory Visit: Payer: Self-pay

## 2017-11-24 ENCOUNTER — Emergency Department (HOSPITAL_COMMUNITY)
Admission: EM | Admit: 2017-11-24 | Discharge: 2017-11-25 | Disposition: A | Discharge status: Home / Self Care | Payer: Medicaid Other | Attending: Emergency Medicine | Admitting: Emergency Medicine

## 2017-11-24 ENCOUNTER — Encounter (HOSPITAL_COMMUNITY): Payer: Self-pay

## 2017-11-24 DIAGNOSIS — I5031 Acute diastolic (congestive) heart failure: Secondary | ICD-10-CM | POA: Insufficient documentation

## 2017-11-24 DIAGNOSIS — R0602 Shortness of breath: Secondary | ICD-10-CM | POA: Diagnosis present

## 2017-11-24 DIAGNOSIS — I110 Hypertensive heart disease with heart failure: Secondary | ICD-10-CM | POA: Diagnosis not present

## 2017-11-24 DIAGNOSIS — F17210 Nicotine dependence, cigarettes, uncomplicated: Secondary | ICD-10-CM | POA: Diagnosis not present

## 2017-11-24 DIAGNOSIS — Z79899 Other long term (current) drug therapy: Secondary | ICD-10-CM | POA: Diagnosis not present

## 2017-11-24 DIAGNOSIS — R42 Dizziness and giddiness: Secondary | ICD-10-CM | POA: Diagnosis not present

## 2017-11-24 LAB — CBC
HCT: 33.7 % — ABNORMAL LOW (ref 39.0–52.0)
Hemoglobin: 10.9 g/dL — ABNORMAL LOW (ref 13.0–17.0)
MCH: 27.2 pg (ref 26.0–34.0)
MCHC: 32.3 g/dL (ref 30.0–36.0)
MCV: 84 fL (ref 78.0–100.0)
PLATELETS: 388 10*3/uL (ref 150–400)
RBC: 4.01 MIL/uL — ABNORMAL LOW (ref 4.22–5.81)
RDW: 18.1 % — AB (ref 11.5–15.5)
WBC: 2.9 10*3/uL — ABNORMAL LOW (ref 4.0–10.5)

## 2017-11-24 LAB — BASIC METABOLIC PANEL
Anion gap: 11 (ref 5–15)
BUN: 5 mg/dL — ABNORMAL LOW (ref 6–20)
CALCIUM: 8.9 mg/dL (ref 8.9–10.3)
CO2: 19 mmol/L — ABNORMAL LOW (ref 22–32)
Chloride: 108 mmol/L (ref 98–111)
Creatinine, Ser: 0.94 mg/dL (ref 0.61–1.24)
GFR calc non Af Amer: >60 mL/min (ref >60)
Glucose, Bld: 103 mg/dL — ABNORMAL HIGH (ref 70–99)
Potassium: 3.3 mmol/L — ABNORMAL LOW (ref 3.5–5.1)
SODIUM: 138 mmol/L (ref 135–145)

## 2017-11-24 NOTE — ED Triage Notes (Signed)
Patient was at home lying bed began to feel short of breath and dizzy.  Patient woke up feeling dizzy and short of breath, was not when going to bed last night.

## 2017-11-24 NOTE — ED Notes (Signed)
Called pt for vitals x3, no response. 

## 2017-11-25 MED ORDER — ONDANSETRON 8 MG PO TBDP: 8.0000 mg | ORAL_TABLET | Freq: Three times a day (TID) | ORAL | 0 refills | Status: AC | PRN

## 2017-11-25 NOTE — ED Notes (Signed)
Patient Alert and oriented to baseline. Stable and ambulatory to baseline. Patient verbalized understanding of the discharge instructions.  Patient belongings were taken by the patient.   

## 2017-11-25 NOTE — ED Notes (Signed)
Patient continues to stop each staff member as they pass complaining of different issues.  Complaining of his belly hurting and wanting something to eat, also stating he is still dizzy.  MD aware.

## 2017-11-26 NOTE — ED Provider Notes (Signed)
[facility] EMERGENCY DEPARTMENT Provider Note   CSN: [id number] Arrival date & time: 11/24/17  1538     History   Chief Complaint Chief Complaint  Patient presents with  . Shortness of Breath  . Dizziness    HPI Austin Mendez is a 57 y.o. male.  HPI 57 year old male presents the emergency department stating that he feels dizzy.  He describes the sensation as more of a lightheadedness sensation.  He felt slightly short of breath when he shown to go to bed last night.  No unilateral leg swelling.  No history of DVT or pulmonary embolism.  No recent surgery or long travel.  Denies fevers and chills.  Requesting something to eat at time my evaluation.  He told nursing staff he is having abdominal pain but reports no abdominal pain at this time.  No dysuria or urinary frequency.  No fevers or chills.  Denies diarrhea.   Past Medical History:  Diagnosis Date  . Dizziness and giddiness   . Hypertension   . Shortness of breath   . Syncope and collapse   . Unspecified essential hypertension     Patient Active Problem List   Diagnosis Date Noted  . Acute diastolic heart failure (HCC) 04/20/2017  . SOB (shortness of breath) 04/19/2017  . Tachypnea 04/19/2017  . Orthostatic dizziness   . Altered mental status   . AKI (acute kidney injury) (HCC)   . Hypertension   . Noncompliance with treatment   . Tobacco abuse   . Hypomagnesemia 09/07/2015  . Epigastric pain 09/07/2015  . ETOH abuse 09/07/2015  . Near syncope   . Medical non-compliance   . 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 Syncope and collapse 08/02/2012  . Facial fracture due to fall (HCC) 08/02/2012  . Hypokalemia 08/02/2012  . Acute kidney injury (HCC) 08/02/2012  . Syncope 08/02/2012  . Alcohol abuse 04/20/2006  . TOBACCO ABUSE 04/20/2006  . Essential hypertension 04/20/2006    History reviewed. No pertinent surgical history.      Home Medications    Prior to Admission medications    Medication Sig Start Date End Date Taking? Authorizing Provider  albuterol (PROVENTIL HFA;VENTOLIN HFA) 108 (90 Base) MCG/ACT inhaler Inhale 2 puffs into the lungs every 6 (six) hours as needed for wheezing or shortness of breath. 04/22/17   Hongalgi, Maximino Greenland, MD  amLODipine (NORVASC) 5 MG tablet Take 1 tablet (5 mg total) by mouth daily. 04/22/17 05/22/17  Hongalgi, Maximino Greenland, MD  carvedilol (COREG) 25 MG tablet Take 1 tablet (25 mg total) by mouth 2 (two) times daily with a meal. 11/09/17   Curatolo, Adam, DO  folic acid (FOLVITE) 1 MG tablet Take 1 tablet (1 mg total) by mouth daily. 04/23/17   Hongalgi, Maximino Greenland, MD  furosemide (LASIX) 40 MG tablet Take 1 tablet (40 mg total) by mouth daily. 04/22/17 05/22/17  Hongalgi, Maximino Greenland, MD  hydrALAZINE (APRESOLINE) 50 MG tablet Take 1 tablet (50 mg total) by mouth 3 (three) times daily. 11/09/17   Curatolo, Adam, DO  Multiple Vitamin (MULTIVITAMIN WITH MINERALS) TABS tablet Take 1 tablet by mouth daily. 04/23/17   Hongalgi, Maximino Greenland, MD  ondansetron (ZOFRAN ODT) 8 MG disintegrating tablet Take 1 tablet (8 mg total) by mouth every 8 (eight) hours as needed for nausea or vomiting. 11/25/17   Azalia Bilis, MD  pantoprazole (PROTONIX) 40 MG tablet Take 1 tablet (40 mg total) by mouth daily. 03/14/17   Ward, Layla Maw, DO  potassium  chloride SA (K-DUR,KLOR-CON) 20 MEQ tablet Take 1 tablet (20 mEq total) by mouth daily. 04/22/17   Hongalgi, Maximino Greenland, MD  thiamine 100 MG tablet Take 1 tablet (100 mg total) by mouth daily. 04/23/17   Hongalgi, Maximino Greenland, MD    Family History Family History  Problem Relation Age of Onset  . Cancer Mother   . Diabetes Father   . Hypertension Unknown     Social History Social History   Tobacco Use  . Smoking status: Current Every Day Smoker    Packs/day: 10.00    Types: Cigarettes  . Smokeless tobacco: Never Used  Substance Use Topics  . Alcohol use: Yes    Alcohol/week: 0.0 standard drinks    Comment: Smells of ETOH  St's just  stopped drinking  . Drug use: Yes    Types: Marijuana     Allergies   Patient has no known allergies.   Review of Systems Review of Systems  All other systems reviewed and are negative.    Physical Exam Updated Vital Signs BP (!) 162/131 (BP Location: Left Arm)   Pulse 67   Temp 99.1 F (37.3 C) (Oral)   Resp 14   SpO2 100%   Physical Exam  Constitutional: He is oriented to person, place, and time. He appears well-developed and well-nourished.  HENT:  Head: Normocephalic and atraumatic.  Eyes: EOM are normal.  Neck: Normal range of motion.  Cardiovascular: Normal rate, regular rhythm and normal heart sounds.  Pulmonary/Chest: Effort normal and breath sounds normal. No respiratory distress.  Abdominal: Soft. He exhibits no distension. There is no tenderness.  Musculoskeletal: Normal range of motion.  Neurological: He is alert and oriented to person, place, and time.  Skin: Skin is warm and dry.  Psychiatric: He has a normal mood and affect. Judgment normal.  Nursing note and vitals reviewed.    ED Treatments / Results  Labs (all labs ordered are listed, but only abnormal results are displayed) Labs Reviewed  CBC - Abnormal; Notable for the following components:      Result Value   WBC 2.9 (*)    RBC 4.01 (*)    Hemoglobin 10.9 (*)    HCT 33.7 (*)    RDW 18.1 (*)    All other components within normal limits  BASIC METABOLIC PANEL - Abnormal; Notable for the following components:   Potassium 3.3 (*)    CO2 19 (*)    Glucose, Bld 103 (*)    BUN 5 (*)    All other components within normal limits    EKG ECG interpretation   Date: 11/26/2017  Rate: 73  Rhythm: normal sinus rhythm  QRS Axis: normal  Intervals: normal  ST/T Wave abnormalities: normal  Conduction Disutrbances: none  Narrative Interpretation:   Old EKG Reviewed: No significant changes noted     Radiology Dg Chest 2 View  Result Date: 11/24/2017 CLINICAL DATA:  Shortness of breath,  dizziness for 1 day, smoking history EXAM: CHEST - 2 VIEW COMPARISON:  Portable chest x-ray of 04/19/2016 FINDINGS: The lungs appear somewhat hyperaerated but no pneumonia or effusion is seen. Healed bilateral rib fractures are present. Mediastinal and hilar contours are unremarkable. Mild cardiomegaly is stable. There are degenerative changes in the mid to lower thoracic spine. IMPRESSION: 1. Stable hyperaeration.  No active lung disease. 2. Stable cardiomegaly. 3. Stable bilateral old rib fractures. Electronically Signed   By: Dwyane Dee M.D.   On: 11/24/2017 17:09    Procedures Procedures (  including critical care time)  Medications Ordered in ED Medications - No data to display   Initial Impression / Assessment and Plan / ED Course  I have reviewed the triage vital signs and the nursing notes.  Pertinent labs & imaging results that were available during my care of the patient were reviewed by me and considered in my medical decision making (see chart for details).     Patient amatory in the ER.  No longer feels dizzy.  Tolerating fluids.  Ambulatory in the ER without difficulty.  Recommended oral hydration for symptoms.  Repeat abdominal exam without tenderness.  Plan for primary care follow-up.  No clear etiology for the patient's symptoms.  Doubt pulmonary embolism.  Doubt dissection.  Doubt ACS.  No signs to suggest congestive heart failure.  Patient is encouraged to return the emergency department for new or worsening symptoms  Final Clinical Impressions(s) / ED Diagnoses   Final diagnoses:  Dizziness    ED Discharge Orders         Ordered    ondansetron (ZOFRAN ODT) 8 MG disintegrating tablet  Every 8 hours PRN     11/25/17 Champ Mungo, MD 11/26/17 [phone]

## 2018-05-07 ENCOUNTER — Emergency Department (HOSPITAL_COMMUNITY)
Admission: EM | Admit: 2018-05-07 | Discharge: 2018-05-07 | Disposition: A | Discharge status: Home / Self Care | Payer: Medicaid Other | Attending: Emergency Medicine | Admitting: Emergency Medicine

## 2018-05-07 ENCOUNTER — Emergency Department (HOSPITAL_COMMUNITY): Payer: Medicaid Other

## 2018-05-07 ENCOUNTER — Other Ambulatory Visit: Payer: Self-pay

## 2018-05-07 ENCOUNTER — Encounter (HOSPITAL_COMMUNITY): Payer: Self-pay | Admitting: Emergency Medicine

## 2018-05-07 DIAGNOSIS — F1010 Alcohol abuse, uncomplicated: Secondary | ICD-10-CM | POA: Insufficient documentation

## 2018-05-07 DIAGNOSIS — Z7982 Long term (current) use of aspirin: Secondary | ICD-10-CM | POA: Diagnosis not present

## 2018-05-07 DIAGNOSIS — E876 Hypokalemia: Secondary | ICD-10-CM | POA: Insufficient documentation

## 2018-05-07 DIAGNOSIS — I10 Essential (primary) hypertension: Secondary | ICD-10-CM

## 2018-05-07 DIAGNOSIS — Z8673 Personal history of transient ischemic attack (TIA), and cerebral infarction without residual deficits: Secondary | ICD-10-CM | POA: Insufficient documentation

## 2018-05-07 DIAGNOSIS — F17210 Nicotine dependence, cigarettes, uncomplicated: Secondary | ICD-10-CM | POA: Insufficient documentation

## 2018-05-07 DIAGNOSIS — Z79899 Other long term (current) drug therapy: Secondary | ICD-10-CM | POA: Insufficient documentation

## 2018-05-07 DIAGNOSIS — R062 Wheezing: Secondary | ICD-10-CM | POA: Insufficient documentation

## 2018-05-07 DIAGNOSIS — I5030 Unspecified diastolic (congestive) heart failure: Secondary | ICD-10-CM | POA: Insufficient documentation

## 2018-05-07 DIAGNOSIS — R42 Dizziness and giddiness: Secondary | ICD-10-CM

## 2018-05-07 DIAGNOSIS — I110 Hypertensive heart disease with heart failure: Secondary | ICD-10-CM | POA: Insufficient documentation

## 2018-05-07 HISTORY — DX: Cerebral infarction, unspecified: I63.9

## 2018-05-07 LAB — CBC WITH DIFFERENTIAL/PLATELET
ABS IMMATURE GRANULOCYTES: 0.01 10*3/uL (ref 0.00–0.07)
BASOS PCT: 1 %
Basophils Absolute: 0 10*3/uL (ref 0.0–0.1)
EOS ABS: 0.1 10*3/uL (ref 0.0–0.5)
Eosinophils Relative: 2 %
HCT: 36.9 % — ABNORMAL LOW (ref 39.0–52.0)
Hemoglobin: 11.8 g/dL — ABNORMAL LOW (ref 13.0–17.0)
IMMATURE GRANULOCYTES: 0 %
Lymphocytes Relative: 24 %
Lymphs Abs: 0.8 10*3/uL (ref 0.7–4.0)
MCH: 27.1 pg (ref 26.0–34.0)
MCHC: 32 g/dL (ref 30.0–36.0)
MCV: 84.6 fL (ref 80.0–100.0)
MONO ABS: 0.5 10*3/uL (ref 0.1–1.0)
Monocytes Relative: 15 %
NEUTROS ABS: 1.8 10*3/uL (ref 1.7–7.7)
NEUTROS PCT: 58 %
PLATELETS: 151 10*3/uL (ref 150–400)
RBC: 4.36 MIL/uL (ref 4.22–5.81)
RDW: 20.9 % — AB (ref 11.5–15.5)
WBC: 3.1 10*3/uL — AB (ref 4.0–10.5)
nRBC: 0 % (ref 0.0–0.2)

## 2018-05-07 LAB — COMPREHENSIVE METABOLIC PANEL
ALT: 63 U/L — ABNORMAL HIGH (ref 0–44)
ANION GAP: 9 (ref 5–15)
AST: 115 U/L — AB (ref 15–41)
Albumin: 2.7 g/dL — ABNORMAL LOW (ref 3.5–5.0)
Alkaline Phosphatase: 86 U/L (ref 38–126)
BUN: 7 mg/dL (ref 6–20)
CHLORIDE: 100 mmol/L (ref 98–111)
CO2: 27 mmol/L (ref 22–32)
Calcium: 8.6 mg/dL — ABNORMAL LOW (ref 8.9–10.3)
Creatinine, Ser: 1.04 mg/dL (ref 0.61–1.24)
GFR calc Af Amer: >60 mL/min (ref >60)
Glucose, Bld: 92 mg/dL (ref 70–99)
POTASSIUM: 3 mmol/L — AB (ref 3.5–5.1)
Sodium: 136 mmol/L (ref 135–145)
TOTAL PROTEIN: 7.6 g/dL (ref 6.5–8.1)
Total Bilirubin: 0.8 mg/dL (ref 0.3–1.2)

## 2018-05-07 LAB — ETHANOL

## 2018-05-07 LAB — TROPONIN I

## 2018-05-07 MED ORDER — MAGNESIUM SULFATE 2 GM/50ML IV SOLN
2.0000 g | INTRAVENOUS | Status: AC
  Administered 2018-05-07: 2 g via INTRAVENOUS
  Filled 2018-05-07: qty 50

## 2018-05-07 MED ORDER — POTASSIUM CHLORIDE ER 10 MEQ PO TBCR: 10.0000 meq | EXTENDED_RELEASE_TABLET | Freq: Every day | ORAL | 0 refills | Status: AC

## 2018-05-07 MED ORDER — POTASSIUM CHLORIDE CRYS ER 20 MEQ PO TBCR
40.0000 meq | EXTENDED_RELEASE_TABLET | Freq: Once | ORAL | Status: AC
  Administered 2018-05-07: 40 meq via ORAL
  Filled 2018-05-07: qty 2

## 2018-05-07 MED ORDER — ALBUTEROL (5 MG/ML) CONTINUOUS INHALATION SOLN
10.0000 mg/h | INHALATION_SOLUTION | RESPIRATORY_TRACT | Status: AC
  Administered 2018-05-07: 10 mg/h via RESPIRATORY_TRACT
  Filled 2018-05-07: qty 20

## 2018-05-07 MED ORDER — HYDRALAZINE HCL 20 MG/ML IJ SOLN
20.0000 mg | Freq: Once | INTRAMUSCULAR | Status: AC
  Administered 2018-05-07: 20 mg via INTRAVENOUS
  Filled 2018-05-07: qty 1

## 2018-05-07 NOTE — Discharge Instructions (Signed)
Your testing today revealed that your potassium level was slightly low.  We have given you replacement potassium and I have ordered a medicine for home so that you take 1 pill of potassium a day for the next week.  Please have your family doctor recheck this level at the end of that time.  Please make sure that you are taking your blood pressure medications exactly as prescribed  Please return to the emergency department for severe or worsening symptoms including chest pain difficulty breathing dizziness or weakness.

## 2018-05-07 NOTE — ED Provider Notes (Signed)
[facility] EMERGENCY DEPARTMENT Provider Note   CSN: [id number] Arrival date & time: 05/07/18  3154     History   Chief Complaint Chief Complaint  Patient presents with  . Abdominal Pain  . Shortness of Breath    HPI Austin Mendez is a 58 y.o. male.  58 year old male who presents with a complaint of #1 dizziness #2 abdominal pain #3 shortness of breath.  The patient reports that he has been compliant with his medications except he has not had them this morning.  He called for ambulance transport because "I did not feel well" he also states "it ain't right".  He is not able to tell me exactly why he is here.  He was noted to be severely hypertensive by paramedics.  Blood pressures ranging between 220/130 systolic.  He denies any room spinning or vertigo, denies any visual changes, the patient does have a baseline speech deficit but does not know why he has had this.  A review of the medical record shows that the patient does have a history of tobacco abuse, alcohol abuse, history of syncope, history of stroke, history of hypertension and a history of acute kidney injury.  He reports that he lives by himself with his wife, she has 1 leg and is not able to help him very much.  He states that he has last bowel movement yesterday which was normal, no signs of blood or diarrhea.  He reports chronic shortness of breath with wheezing and was found to be wheezing by paramedics.  He denies feeling like he is going to pass out but states I still feel dizzy.  He cannot tell me what dizzy means to him.  He has taken no other medications prior to arrival.  The patient denies chest pain, denies leg swelling, denies visual changes.  He also cannot tell me when the symptoms started.  Unfortunately the history is sparse.   Abdominal Pain  Associated symptoms: shortness of breath   Associated symptoms: no chest pain, no chills, no cough, no diarrhea, no dysuria, no fever, no nausea,  no sore throat and no vomiting   Shortness of Breath  Associated symptoms: abdominal pain   Associated symptoms: no chest pain, no cough, no fever, no headaches, no neck pain, no rash, no sore throat and no vomiting       Past Medical History:  Diagnosis Date  . Dizziness and giddiness   . Hypertension   . Shortness of breath   . Stroke (HCC)   . Syncope and collapse   . Unspecified essential hypertension     Patient Active Problem List   Diagnosis Date Noted  . Acute diastolic heart failure (HCC) 04/20/2017  . SOB (shortness of breath) 04/19/2017  . Tachypnea 04/19/2017  . Orthostatic dizziness   . Altered mental status   . AKI (acute kidney injury) (HCC)   . Hypertension   . Noncompliance with treatment   . Tobacco abuse   . Hypomagnesemia 09/07/2015  . Epigastric pain 09/07/2015  . ETOH abuse 09/07/2015  . Near syncope   . Medical non-compliance   . 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 Syncope and collapse 08/02/2012  . Facial fracture due to fall (HCC) 08/02/2012  . Hypokalemia 08/02/2012  . Acute kidney injury (HCC) 08/02/2012  . Syncope 08/02/2012  . Alcohol abuse 04/20/2006  . TOBACCO ABUSE 04/20/2006  . Essential hypertension 04/20/2006    History reviewed. No pertinent surgical history.      Home  Medications    Prior to Admission medications   Medication Sig Start Date End Date Taking? Authorizing Provider  amLODipine (NORVASC) 5 MG tablet Take 1 tablet (5 mg total) by mouth daily. 04/22/17 05/07/18 Yes Hongalgi, Maximino Greenland, MD  ASPIRIN LOW DOSE 81 MG EC tablet Take 81 mg by mouth daily. 04/19/18  Yes [provider]  carvedilol (COREG) 25 MG tablet Take 1 tablet (25 mg total) by mouth 2 (two) times daily with a meal. 11/09/17  Yes Curatolo, Adam, DO  folic acid (FOLVITE) 1 MG tablet Take 1 tablet (1 mg total) by mouth daily. 04/23/17  Yes Hongalgi, Maximino Greenland, MD  hydrALAZINE (APRESOLINE) 50 MG tablet Take 1 tablet (50 mg total) by mouth 3 (three)  times daily. Patient taking differently: Take 25 mg by mouth every 8 (eight) hours.  11/09/17  Yes Curatolo, Adam, DO  ondansetron (ZOFRAN ODT) 8 MG disintegrating tablet Take 1 tablet (8 mg total) by mouth every 8 (eight) hours as needed for nausea or vomiting. 11/25/17  Yes Azalia Bilis, MD  POLY-IRON 150 150 MG capsule Take 300 mg by mouth daily. 04/19/18  Yes [provider]  thiamine 100 MG tablet Take 1 tablet (100 mg total) by mouth daily. 04/23/17  Yes Hongalgi, Maximino Greenland, MD  albuterol (PROVENTIL HFA;VENTOLIN HFA) 108 (90 Base) MCG/ACT inhaler Inhale 2 puffs into the lungs every 6 (six) hours as needed for wheezing or shortness of breath. 04/22/17   Hongalgi, Maximino Greenland, MD  furosemide (LASIX) 40 MG tablet Take 1 tablet (40 mg total) by mouth daily. Patient not taking: Reported on 05/07/2018 04/22/17 05/22/17  Elease Etienne, MD  Multiple Vitamin (MULTIVITAMIN WITH MINERALS) TABS tablet Take 1 tablet by mouth daily. Patient not taking: Reported on 05/07/2018 04/23/17   Elease Etienne, MD  pantoprazole (PROTONIX) 40 MG tablet Take 1 tablet (40 mg total) by mouth daily. Patient not taking: Reported on 05/07/2018 03/14/17   Ward, Layla Maw, DO  potassium chloride (K-DUR) 10 MEQ tablet Take 1 tablet (10 mEq total) by mouth daily for 7 days. 05/07/18 05/14/18  Eber Hong, MD    Family History Family History  Problem Relation Age of Onset  . Cancer Mother   . Diabetes Father   . Hypertension Other     Social History Social History   Tobacco Use  . Smoking status: Current Every Day Smoker    Packs/day: 10.00    Types: Cigarettes  . Smokeless tobacco: Never Used  Substance Use Topics  . Alcohol use: Yes    Alcohol/week: 0.0 standard drinks    Comment: Smells of ETOH  St's just stopped drinking  . Drug use: Yes    Types: Marijuana     Allergies   Patient has no known allergies.   Review of Systems Review of Systems  Constitutional: Negative for chills and fever.  HENT:  Negative for sore throat.   Eyes: Negative for visual disturbance.  Respiratory: Positive for shortness of breath. Negative for cough.   Cardiovascular: Negative for chest pain.  Gastrointestinal: Positive for abdominal pain. Negative for diarrhea, nausea and vomiting.  Genitourinary: Negative for dysuria and frequency.  Musculoskeletal: Negative for back pain and neck pain.  Skin: Negative for rash.  Neurological: Positive for dizziness. Negative for weakness, numbness and headaches.  Hematological: Negative for adenopathy.  Psychiatric/Behavioral: Negative for behavioral problems.  All other systems reviewed and are negative.    Physical Exam Updated Vital Signs BP (!) 209/118 (BP Location: Right Arm)  Pulse 71   Temp 97.7 F (36.5 C) (Oral)   Resp 20   SpO2 100%   Physical Exam Vitals signs and nursing note reviewed.  Constitutional:      General: He is not in acute distress.    Appearance: He is well-developed.  HENT:     Head: Normocephalic and atraumatic.     Mouth/Throat:     Pharynx: No oropharyngeal exudate.  Eyes:     General: No scleral icterus.       Right eye: No discharge.        Left eye: No discharge.     Conjunctiva/sclera: Conjunctivae normal.     Pupils: Pupils are equal, round, and reactive to light.  Neck:     Musculoskeletal: Normal range of motion and neck supple.     Thyroid: No thyromegaly.     Vascular: No JVD.  Cardiovascular:     Rate and Rhythm: Normal rate and regular rhythm.     Heart sounds: Normal heart sounds. No murmur. No friction rub. No gallop.   Pulmonary:     Effort: Pulmonary effort is normal. No respiratory distress.     Breath sounds: Wheezing present. No rales.     Comments: Patient has no increased work of breathing, speaking in full sentences with no accessory muscle use though he does have diffuse mild expiratory wheezing and a slight prolonged expiratory phase Abdominal:     General: Bowel sounds are normal. There is  no distension.     Palpations: Abdomen is soft. There is no mass.     Tenderness: There is abdominal tenderness ( Mild diffuse tenderness, no guarding, mild tympanitic sounds to percussion).  Musculoskeletal: Normal range of motion.        General: No tenderness.  Lymphadenopathy:     Cervical: No cervical adenopathy.  Skin:    General: Skin is warm and dry.     Findings: No erythema or rash.  Neurological:     Mental Status: He is alert.     Coordination: Coordination normal.     Comments: Slight left-sided facial droop, slight left lower extremity weakness, he is able to grip bilaterally.  Speech is slightly slowed but able to answer all my questions  Psychiatric:        Behavior: Behavior normal.      ED Treatments / Results  Labs (all labs ordered are listed, but only abnormal results are displayed) Labs Reviewed  CBC WITH DIFFERENTIAL/PLATELET - Abnormal; Notable for the following components:      Result Value   WBC 3.1 (*)    Hemoglobin 11.8 (*)    HCT 36.9 (*)    RDW 20.9 (*)    All other components within normal limits  COMPREHENSIVE METABOLIC PANEL - Abnormal; Notable for the following components:   Potassium 3.0 (*)    Calcium 8.6 (*)    Albumin 2.7 (*)    AST 115 (*)    ALT 63 (*)    All other components within normal limits  ETHANOL  TROPONIN I    EKG EKG Interpretation  Date/Time:  Friday May 07 2018 06:24:38 EST Ventricular Rate:  72 PR Interval:    QRS Duration: 97 QT Interval:  424 QTC Calculation: 464 R Axis:   71 Text Interpretation:  Sinus rhythm Left ventricular hypertrophy ST elevation, consider anterior injury no changes over time - similar ST findings Confirmed by Eber Hong ([id number]) on 05/07/2018 6:52:57 AM   Radiology Dg Chest 2 View  Result Date: 05/07/2018 CLINICAL DATA:  Cough and chest pain EXAM: CHEST - 2 VIEW COMPARISON:  November 24, 2017 FINDINGS: There is no edema or consolidation. Heart size and pulmonary vascularity  are normal. No adenopathy. No bone lesions. IMPRESSION: No edema or consolidation. Electronically Signed   By: Bretta BangWilliam  Woodruff III M.D.   On: 05/07/2018 07:25    Procedures Procedures (including critical care time)  Medications Ordered in ED Medications  albuterol (PROVENTIL,VENTOLIN) solution continuous neb (0 mg/hr Nebulization Stopped 05/07/18 1046)  potassium chloride SA (K-DUR,KLOR-CON) CR tablet 40 mEq (has no administration in time range)  magnesium sulfate IVPB 2 g 50 mL (has no administration in time range)  hydrALAZINE (APRESOLINE) injection 20 mg (20 mg Intravenous Given 05/07/18 0749)     Initial Impression / Assessment and Plan / ED Course  I have reviewed the triage vital signs and the nursing notes.  Pertinent labs & imaging results that were available during my care of the patient were reviewed by me and considered in my medical decision making (see chart for details).  Clinical Course as of May 07 1049  Fri May 07, 2018  0827 Patient's chest x-ray shows no acute findings including consolidations or pneumothorax.  His white blood cell count is slightly low at 3.1, this is a chronic finding for the patient.   [BM]  [id number] Liver function tests are elevated consistent with a history of alcohol use including an AST that is approximately twice the ALT, alcohol level is negative, potassium level is 3.0   [BM]  0849 Trop negative, bmp with low K - will replace.   [BM]    Clinical Course User Index [BM] Eber HongMiller, Dyonna Jaspers, MD    Despite the patient's abnormal blood pressure his exam is rather unremarkable.  He does have chronic COPD and it is not surprising to hear him wheezing, he has no significant abdominal tenderness and states that he actually does not hurt right now but when I push he does have mild pain, his dizziness is very vague and unclear.  Due to his severe hypertension we will treat that, check some labs, nebulized treatment, patient agreeable  Vitals:   05/07/18  0834 05/07/18 0845 05/07/18 0900 05/07/18 0915  BP:  (!) 169/102 (!) 166/107 (!) 181/99  Pulse: 80 78 73 79  Resp: 16 (!) 21 19 (!) 24  Temp:      TempSrc:      SpO2: 99% 100% 100% 100%  Weight:      Height:        Though the patient has had some fluctuation in his blood pressure ultimately it is improving and his diastolic is below 100.  At this time the patient has a normal mental status, he states he is not dizzy, he wants to eat, drink and is aware of his results which are unremarkable except for the potassium which was replaced.  At this time the patient appears stable for discharge, I encouraged him to be compliant with his medications.  He was given a dose of his oral home medications while he was here.  Wheezing treated with neb - improved - no more wheezing on d/c.  The patient expresses understanding  Final Clinical Impressions(s) / ED Diagnoses   Final diagnoses:  Essential hypertension  Dizziness  Wheezing  Hypokalemia    ED Discharge Orders         Ordered    potassium chloride (K-DUR) 10 MEQ tablet  Daily     05/07/18 1050  Eber Hong, MD 05/07/18 1051

## 2018-05-07 NOTE — ED Triage Notes (Signed)
[name] [facility], c/o abdominal pain and shortness of breathx 2 days. Abdomen tender on palpation, pt reports constipation as well. EMS noted wheezing in all lobes, 5mg  albuterol given with some improvement. Hx stroke with speech deficits. EMS vitals: BP-212/139, HR-76, denies chest pain, hx HTN not taking meds as prescribed.

## 2018-05-07 NOTE — ED Notes (Signed)
Pt requesting food and beverage.  Notified that all labs have not returned and that we will reassess once labs return and his hour-long nebx is completed.

## 2018-08-21 ENCOUNTER — Emergency Department (HOSPITAL_COMMUNITY)
Admission: EM | Admit: 2018-08-21 | Discharge: 2018-08-21 | Disposition: A | Discharge status: Home / Self Care | Payer: Medicaid Other | Attending: Emergency Medicine | Admitting: Emergency Medicine

## 2018-08-21 ENCOUNTER — Encounter (HOSPITAL_COMMUNITY): Payer: Self-pay

## 2018-08-21 ENCOUNTER — Emergency Department (HOSPITAL_COMMUNITY): Payer: Medicaid Other

## 2018-08-21 DIAGNOSIS — Z1159 Encounter for screening for other viral diseases: Secondary | ICD-10-CM | POA: Diagnosis not present

## 2018-08-21 DIAGNOSIS — R51 Headache: Secondary | ICD-10-CM | POA: Diagnosis not present

## 2018-08-21 DIAGNOSIS — I110 Hypertensive heart disease with heart failure: Secondary | ICD-10-CM | POA: Diagnosis not present

## 2018-08-21 DIAGNOSIS — F1210 Cannabis abuse, uncomplicated: Secondary | ICD-10-CM | POA: Diagnosis not present

## 2018-08-21 DIAGNOSIS — R0600 Dyspnea, unspecified: Secondary | ICD-10-CM | POA: Diagnosis not present

## 2018-08-21 DIAGNOSIS — Z8673 Personal history of transient ischemic attack (TIA), and cerebral infarction without residual deficits: Secondary | ICD-10-CM

## 2018-08-21 DIAGNOSIS — I5030 Unspecified diastolic (congestive) heart failure: Secondary | ICD-10-CM | POA: Insufficient documentation

## 2018-08-21 DIAGNOSIS — Z79899 Other long term (current) drug therapy: Secondary | ICD-10-CM | POA: Insufficient documentation

## 2018-08-21 DIAGNOSIS — F10920 Alcohol use, unspecified with intoxication, uncomplicated: Secondary | ICD-10-CM | POA: Diagnosis not present

## 2018-08-21 DIAGNOSIS — F10929 Alcohol use, unspecified with intoxication, unspecified: Secondary | ICD-10-CM

## 2018-08-21 DIAGNOSIS — R451 Restlessness and agitation: Secondary | ICD-10-CM

## 2018-08-21 DIAGNOSIS — Z7982 Long term (current) use of aspirin: Secondary | ICD-10-CM | POA: Diagnosis not present

## 2018-08-21 DIAGNOSIS — F1410 Cocaine abuse, uncomplicated: Secondary | ICD-10-CM | POA: Diagnosis not present

## 2018-08-21 DIAGNOSIS — R42 Dizziness and giddiness: Secondary | ICD-10-CM | POA: Diagnosis present

## 2018-08-21 DIAGNOSIS — F17210 Nicotine dependence, cigarettes, uncomplicated: Secondary | ICD-10-CM | POA: Diagnosis not present

## 2018-08-21 LAB — POCT I-STAT 7, (LYTES, BLD GAS, ICA,H+H)
Acid-base deficit: 1 mmol/L (ref 0.0–2.0)
Bicarbonate: 23 mmol/L (ref 20.0–28.0)
Calcium, Ion: 1.1 mmol/L — ABNORMAL LOW (ref 1.15–1.40)
HCT: 34 % — ABNORMAL LOW (ref 39.0–52.0)
Hemoglobin: 11.6 g/dL — ABNORMAL LOW (ref 13.0–17.0)
O2 Saturation: 94 %
Patient temperature: 97.6
Potassium: 3.5 mmol/L (ref 3.5–5.1)
Sodium: 143 mmol/L (ref 135–145)
TCO2: 24 mmol/L (ref 22–32)
pCO2 arterial: 36 mmHg (ref 32.0–48.0)
pH, Arterial: 7.411 (ref 7.350–7.450)
pO2, Arterial: 69 mmHg — ABNORMAL LOW (ref 83.0–108.0)

## 2018-08-21 LAB — URINALYSIS, ROUTINE W REFLEX MICROSCOPIC
Bilirubin Urine: NEGATIVE
Glucose, UA: NEGATIVE mg/dL
Hgb urine dipstick: NEGATIVE
Ketones, ur: NEGATIVE mg/dL
Leukocytes,Ua: NEGATIVE
Nitrite: NEGATIVE
Protein, ur: 100 mg/dL — AB
Specific Gravity, Urine: 1.009 (ref 1.005–1.030)
pH: 6 (ref 5.0–8.0)

## 2018-08-21 LAB — COMPREHENSIVE METABOLIC PANEL
ALT: 89 U/L — ABNORMAL HIGH (ref 0–44)
AST: 225 U/L — ABNORMAL HIGH (ref 15–41)
Albumin: 2.9 g/dL — ABNORMAL LOW (ref 3.5–5.0)
Alkaline Phosphatase: 114 U/L (ref 38–126)
Anion gap: 15 (ref 5–15)
BUN: 6 mg/dL (ref 6–20)
CO2: 23 mmol/L (ref 22–32)
Calcium: 8.7 mg/dL — ABNORMAL LOW (ref 8.9–10.3)
Chloride: 102 mmol/L (ref 98–111)
Creatinine, Ser: 0.99 mg/dL (ref 0.61–1.24)
GFR calc Af Amer: >60 mL/min (ref >60)
GFR calc non Af Amer: >60 mL/min (ref >60)
Glucose, Bld: 89 mg/dL (ref 70–99)
Potassium: 3.7 mmol/L (ref 3.5–5.1)
Sodium: 140 mmol/L (ref 135–145)
Total Bilirubin: 0.7 mg/dL (ref 0.3–1.2)
Total Protein: 7.7 g/dL (ref 6.5–8.1)

## 2018-08-21 LAB — CBC
HCT: 36 % — ABNORMAL LOW (ref 39.0–52.0)
Hemoglobin: 12.3 g/dL — ABNORMAL LOW (ref 13.0–17.0)
MCH: 28.3 pg (ref 26.0–34.0)
MCHC: 34.2 g/dL (ref 30.0–36.0)
MCV: 82.9 fL (ref 80.0–100.0)
Platelets: 212 10*3/uL (ref 150–400)
RBC: 4.34 MIL/uL (ref 4.22–5.81)
RDW: 18.7 % — ABNORMAL HIGH (ref 11.5–15.5)
WBC: 2.7 10*3/uL — ABNORMAL LOW (ref 4.0–10.5)
nRBC: 0 % (ref 0.0–0.2)

## 2018-08-21 LAB — RAPID URINE DRUG SCREEN, HOSP PERFORMED
Amphetamines: NOT DETECTED
Barbiturates: NOT DETECTED
Benzodiazepines: NOT DETECTED
Cocaine: NOT DETECTED
Opiates: NOT DETECTED
Tetrahydrocannabinol: NOT DETECTED

## 2018-08-21 LAB — AMMONIA: Ammonia: 24 umol/L (ref 9–35)

## 2018-08-21 LAB — PROTIME-INR
INR: 1.1 (ref 0.8–1.2)
Prothrombin Time: 13.7 seconds (ref 11.4–15.2)

## 2018-08-21 LAB — TROPONIN I: Troponin I: <0.03 ng/mL (ref <0.03)

## 2018-08-21 LAB — PHOSPHORUS: Phosphorus: 3.5 mg/dL (ref 2.5–4.6)

## 2018-08-21 LAB — TSH: TSH: 0.965 u[IU]/mL (ref 0.350–4.500)

## 2018-08-21 LAB — SARS CORONAVIRUS 2 BY RT PCR (HOSPITAL ORDER, PERFORMED IN ~~LOC~~ HOSPITAL LAB): SARS Coronavirus 2: NEGATIVE

## 2018-08-21 LAB — MAGNESIUM: Magnesium: 1.7 mg/dL (ref 1.7–2.4)

## 2018-08-21 LAB — ETHANOL: Alcohol, Ethyl (B): 183 mg/dL — ABNORMAL HIGH (ref <10)

## 2018-08-21 LAB — BRAIN NATRIURETIC PEPTIDE: B Natriuretic Peptide: 91.4 pg/mL (ref 0.0–100.0)

## 2018-08-21 MED ORDER — VITAMIN B-1 100 MG PO TABS: 100.0000 mg | ORAL_TABLET | Freq: Every day | ORAL | Status: DC

## 2018-08-21 MED ORDER — ALBUTEROL SULFATE HFA 108 (90 BASE) MCG/ACT IN AERS
4.0000 | INHALATION_SPRAY | Freq: Once | RESPIRATORY_TRACT | Status: AC
  Administered 2018-08-21: 4 via RESPIRATORY_TRACT
  Filled 2018-08-21: qty 6.7

## 2018-08-21 MED ORDER — THIAMINE HCL 100 MG/ML IJ SOLN
Freq: Once | INTRAVENOUS | Status: AC
  Administered 2018-08-21: 08:00:00 via INTRAVENOUS
  Filled 2018-08-21: qty 1000

## 2018-08-21 MED ORDER — LORAZEPAM 1 MG PO TABS: 0.0000 mg | ORAL_TABLET | Freq: Two times a day (BID) | ORAL | Status: DC

## 2018-08-21 MED ORDER — LORAZEPAM 2 MG/ML IJ SOLN: 0.0000 mg | Freq: Four times a day (QID) | INTRAMUSCULAR | Status: DC

## 2018-08-21 MED ORDER — LORAZEPAM 2 MG/ML IJ SOLN: 1.0000 mg | INTRAMUSCULAR | Status: DC | PRN

## 2018-08-21 MED ORDER — THIAMINE HCL 100 MG/ML IJ SOLN: 100.0000 mg | Freq: Every day | INTRAMUSCULAR | Status: DC

## 2018-08-21 MED ORDER — LORAZEPAM 2 MG/ML IJ SOLN
1.0000 mg | Freq: Once | INTRAMUSCULAR | Status: AC
  Administered 2018-08-21: 1 mg via INTRAVENOUS
  Filled 2018-08-21: qty 1

## 2018-08-21 MED ORDER — ALBUTEROL SULFATE HFA 108 (90 BASE) MCG/ACT IN AERS
2.0000 | INHALATION_SPRAY | Freq: Once | RESPIRATORY_TRACT | Status: AC
  Administered 2018-08-21: 2 via RESPIRATORY_TRACT

## 2018-08-21 MED ORDER — LORAZEPAM 2 MG/ML IJ SOLN: 0.0000 mg | Freq: Two times a day (BID) | INTRAMUSCULAR | Status: DC

## 2018-08-21 MED ORDER — LORAZEPAM 1 MG PO TABS: 0.0000 mg | ORAL_TABLET | Freq: Four times a day (QID) | ORAL | Status: DC

## 2018-08-21 MED ORDER — ONDANSETRON 4 MG PO TBDP
4.0000 mg | ORAL_TABLET | Freq: Once | ORAL | Status: AC
  Administered 2018-08-21: 4 mg via ORAL
  Filled 2018-08-21: qty 1

## 2018-08-21 NOTE — ED Notes (Signed)
Patient refusing to be put back on monitor

## 2018-08-21 NOTE — ED Notes (Signed)
Pt repeatedly pulling leads off, Bp cuff, pulse ox and oxygen off pt coming to door opening it  pt want stay seated pt yelling.
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
## 2018-08-21 NOTE — ED Notes (Signed)
Dr . Velda Shell aware pt vomiting slight anount of phlegm ; ok to medicate with zofran and ok to discharge

## 2018-08-21 NOTE — Discharge Instructions (Addendum)
1.  Make an appointment to see your doctor for recheck as soon as possible. 2.  Discuss alcohol use and medical complications with your doctor.  You may need treatment for alcohol abuse. 3.  Return to the emergency department if there are any new, worsening or concerning symptoms.

## 2018-08-21 NOTE — ED Provider Notes (Signed)
[facility] EMERGENCY DEPARTMENT Provider Note   CSN: [id number] Arrival date & time: 08/21/18  4496    History   Chief Complaint Chief Complaint  Patient presents with   Dizziness    HPI Austin Mendez is a 58 y.o. male.     HPI Patient is a very difficult historian.  He reports he is "dizzy headed".  I am unable to get additional clarification as to what he means by that.  He did not endorse a "headache" but reported again that he is "dizzy headed".  He has not described a domestic dispute situation as reported per EMS report.  He does endorse that he was home with his wife but got angry at the suggestion that the EMS call was in regards to domestic dispute.  He does not endorse focal extremity weakness or numbness.  He replies "not that I can tell".  Patient states that he "cannot breathe".  He does not endorse any other positives on cardiac or respiratory review of systems.  Patient has taken his oxygen off and set it up on his forehead stating "I breathe better with it off".  He does admit to drinking alcohol and reports that that was "last night".  At this time, patient is being cooperative but hostile in demeanor.  I have reviewed prior EMR's for medical history and prior presentations.  It does seem that previously patient has patient was difficult to understand and slurred and some previous left-sided findings. Past Medical History:  Diagnosis Date   Dizziness and giddiness    Hypertension    Shortness of breath    Stroke [facility])    Syncope and collapse    Unspecified essential hypertension     Patient Active Problem List   Diagnosis Date Noted   Acute diastolic heart failure (HCC) 04/20/2017   SOB (shortness of breath) 04/19/2017   Tachypnea 04/19/2017   Orthostatic dizziness    Altered mental status    AKI (acute kidney injury) (HCC)    Hypertension    Noncompliance with treatment    Tobacco abuse    Hypomagnesemia 09/07/2015     Epigastric pain 09/07/2015   ETOH abuse 09/07/2015   Near syncope    Medical non-compliance    Malignant hypertension 08/02/2012   Syncope and collapse 08/02/2012   Facial fracture due to fall (HCC) 08/02/2012   Hypokalemia 08/02/2012   Acute kidney injury (HCC) 08/02/2012   Syncope 08/02/2012   Alcohol abuse 04/20/2006   TOBACCO ABUSE 04/20/2006   Essential hypertension 04/20/2006    History reviewed. No pertinent surgical history.      Home Medications    Prior to Admission medications   Medication Sig Start Date End Date Taking? Authorizing Provider  albuterol (PROVENTIL HFA;VENTOLIN HFA) 108 (90 Base) MCG/ACT inhaler Inhale 2 puffs into the lungs every 6 (six) hours as needed for wheezing or shortness of breath. 04/22/17   Hongalgi, Maximino Greenland, MD  amLODipine (NORVASC) 5 MG tablet Take 1 tablet (5 mg total) by mouth daily. 04/22/17 05/07/18  Hongalgi, Maximino Greenland, MD  ASPIRIN LOW DOSE 81 MG EC tablet Take 81 mg by mouth daily. 04/19/18   [provider]  carvedilol (COREG) 25 MG tablet Take 1 tablet (25 mg total) by mouth 2 (two) times daily with a meal. 11/09/17   Curatolo, Adam, DO  folic acid (FOLVITE) 1 MG tablet Take 1 tablet (1 mg total) by mouth daily. 04/23/17   Hongalgi, Maximino Greenland, MD  furosemide (LASIX)  40 MG tablet Take 1 tablet (40 mg total) by mouth daily. Patient not taking: Reported on 05/07/2018 04/22/17 05/22/17  Elease Etienne, MD  hydrALAZINE (APRESOLINE) 50 MG tablet Take 1 tablet (50 mg total) by mouth 3 (three) times daily. Patient taking differently: Take 25 mg by mouth every 8 (eight) hours.  11/09/17   Curatolo, Adam, DO  Multiple Vitamin (MULTIVITAMIN WITH MINERALS) TABS tablet Take 1 tablet by mouth daily. Patient not taking: Reported on 05/07/2018 04/23/17   Elease Etienne, MD  ondansetron (ZOFRAN ODT) 8 MG disintegrating tablet Take 1 tablet (8 mg total) by mouth every 8 (eight) hours as needed for nausea or vomiting. 11/25/17   Azalia Bilis, MD  pantoprazole (PROTONIX) 40 MG tablet Take 1 tablet (40 mg total) by mouth daily. Patient not taking: Reported on 05/07/2018 03/14/17   Ward, Layla Maw, DO  POLY-IRON 150 150 MG capsule Take 300 mg by mouth daily. 04/19/18   [provider]  potassium chloride (K-DUR) 10 MEQ tablet Take 1 tablet (10 mEq total) by mouth daily for 7 days. 05/07/18 05/14/18  Eber Hong, MD  thiamine 100 MG tablet Take 1 tablet (100 mg total) by mouth daily. 04/23/17   Hongalgi, Maximino Greenland, MD    Family History Family History  Problem Relation Age of Onset   Cancer Mother    Diabetes Father    Hypertension Other     Social History Social History   Tobacco Use   Smoking status: Current Every Day Smoker    Packs/day: 10.00    Types: Cigarettes   Smokeless tobacco: Never Used  Substance Use Topics   Alcohol use: Yes    Alcohol/week: 0.0 standard drinks    Comment: Smells of ETOH  St's just stopped drinking   Drug use: Yes    Types: Marijuana, Cocaine     Allergies   Patient has no known allergies.   Review of Systems Review of Systems 10 Systems reviewed and are negative for acute change except as noted in the HPI.  Patient is unreliable for his review of systems.  Physical Exam Updated Vital Signs BP (!) 179/107 (BP Location: Right Arm)    Pulse 81    Temp 97.6 F (36.4 C) (Oral)    Resp 20    SpO2 94%   Physical Exam Constitutional:      Comments: Patient is alert and sitting up in the stretcher.  He does not appear to have any active respiratory distress.  Pressures and oxygen saturation are stable.  HENT:     Head: Normocephalic and atraumatic.     Mouth/Throat:     Mouth: Mucous membranes are moist.     Pharynx: Oropharynx is clear.     Comments: Posterior airway widely patent.  No tongue swelling.  Symmetric elevation of palate, palate is patulous with a very low uvula.  Voice is very gravelly.  Nares are patent.  Patient has septal deviation. When Patient is  instructed to close his mouth and breathe through his nose, breath sounds are normal. Eyes:     Extraocular Movements: Extraocular movements intact.     Pupils: Pupils are equal, round, and reactive to light.  Neck:     Musculoskeletal: Neck supple.  Cardiovascular:     Rate and Rhythm: Normal rate and regular rhythm.     Pulses: Normal pulses.     Heart sounds: Normal heart sounds.  Pulmonary:     Effort: Pulmonary effort is normal.  Breath sounds: Normal breath sounds.     Comments: Sounds are diffusely soft.  Voice is gravelly.  Objectively, patient does not appear to be in respiratory distress. Abdominal:     General: There is no distension.     Palpations: Abdomen is soft.     Tenderness: There is no abdominal tenderness. There is no guarding.  Musculoskeletal: Normal range of motion.     Right lower leg: No edema.     Left lower leg: No edema.  Skin:    General: Skin is warm and dry.  Neurological:     Comments: Patient is alert.  He is expressing his thoughts.  Voice is very gravelly in quality and somewhat hard to understand.  His speech does not seem to be slurred.  Questionable slight left sided facial droop.  Ocular motions intact.  Patient can hold both hands up and does not exhibit pronator drift.  Strength left and right are 5\5.  Patient can elevate each lower extremity independently off the bed and hold.  Psychiatric:     Comments: Patient is moderately hostile in his conversation content and quality.  He is being cooperative at this time.  He is not withdrawn or somnolent.      ED Treatments / Results  Labs (all labs ordered are listed, but only abnormal results are displayed) Labs Reviewed  COMPREHENSIVE METABOLIC PANEL - Abnormal; Notable for the following components:      Result Value   Calcium 8.7 (*)    Albumin 2.9 (*)    AST 225 (*)    ALT 89 (*)    All other components within normal limits  ETHANOL - Abnormal; Notable for the following components:     Alcohol, Ethyl (B) 183 (*)    All other components within normal limits  CBC - Abnormal; Notable for the following components:   WBC 2.7 (*)    Hemoglobin 12.3 (*)    HCT 36.0 (*)    RDW 18.7 (*)    All other components within normal limits  URINALYSIS, ROUTINE W REFLEX MICROSCOPIC - Abnormal; Notable for the following components:   Protein, ur 100 (*)    Bacteria, UA RARE (*)    All other components within normal limits  POCT I-STAT 7, (LYTES, BLD GAS, ICA,H+H) - Abnormal; Notable for the following components:   pO2, Arterial 69.0 (*)    Calcium, Ion 1.10 (*)    HCT 34.0 (*)    Hemoglobin 11.6 (*)    All other components within normal limits  SARS CORONAVIRUS 2 (HOSPITAL ORDER, PERFORMED IN [facility])  TROPONIN I  PROTIME-INR  RAPID URINE DRUG SCREEN, HOSP PERFORMED  BRAIN NATRIURETIC PEPTIDE  AMMONIA  TSH  MAGNESIUM  PHOSPHORUS  BLOOD GAS, ARTERIAL    EKG EKG Interpretation  Date/Time:  Saturday Aug 21 2018 06:58:36 EDT Ventricular Rate:  74 PR Interval:    QRS Duration: 99 QT Interval:  421 QTC Calculation: 468 R Axis:   78 Text Interpretation:  Sinus rhythm Left ventricular hypertrophy ST elevation suggests acute pericarditis no change from previous Confirmed by Arby BarrettePfeiffer, Bless Belshe [phone]([id number]) on 08/21/2018 7:06:02 AM   Radiology Ct Head Wo Contrast  Result Date: 08/21/2018 CLINICAL DATA:  Dizziness, possible ETOH EXAM: CT HEAD WITHOUT CONTRAST TECHNIQUE: Contiguous axial images were obtained from the base of the skull through the vertex without intravenous contrast. COMPARISON:  MRI brain dated 04/22/2017 FINDINGS: Brain: No evidence of acute infarction, hemorrhage, hydrocephalus, extra-axial collection or mass lesion/mass  effect. Subcortical white matter and periventricular small vessel ischemic changes. Mild cortical and central atrophy. Vascular: Intracranial atherosclerosis. Skull: Normal. Negative for fracture or focal lesion. Sinuses/Orbits:  Opacification of the right maxillary sinus. Mastoid air cells are clear. Other: None. IMPRESSION: No evidence of acute intracranial abnormality. Atrophy with small vessel ischemic changes. Electronically Signed   By: Charline Bills M.D.   On: 08/21/2018 10:23   Dg Chest Port 1 View  Result Date: 08/21/2018 CLINICAL DATA:  Dyspnea EXAM: PORTABLE CHEST 1 VIEW COMPARISON:  05/07/2018 chest radiograph. FINDINGS: Stable cardiomediastinal silhouette with normal heart size. No pneumothorax. No pleural effusion. Lungs appear clear, with no acute consolidative airspace disease and no pulmonary edema. IMPRESSION: No active disease. Electronically Signed   By: Delbert Phenix M.D.   On: 08/21/2018 08:08    Procedures Procedures (including critical care time)  Medications Ordered in ED Medications  LORazepam (ATIVAN) injection 1 mg (has no administration in time range)  LORazepam (ATIVAN) injection 0-4 mg (0 mg Intravenous Not Given 08/21/18 1002)    Or  LORazepam (ATIVAN) tablet 0-4 mg ( Oral See Alternative 08/21/18 1002)  LORazepam (ATIVAN) injection 0-4 mg (has no administration in time range)    Or  LORazepam (ATIVAN) tablet 0-4 mg (has no administration in time range)  thiamine (VITAMIN B-1) tablet 100 mg (100 mg Oral Refused 08/21/18 1023)    Or  thiamine (B-1) injection 100 mg ( Intravenous See Alternative 08/21/18 1023)  albuterol (VENTOLIN HFA) 108 (90 Base) MCG/ACT inhaler 4 puff (4 puffs Inhalation Given 08/21/18 0810)  sodium chloride 0.9 % 1,000 mL with thiamine 100 mg, folic acid 1 mg, multivitamins adult 10 mL infusion ( Intravenous Stopped 08/21/18 0922)  LORazepam (ATIVAN) injection 1 mg (1 mg Intravenous Given 08/21/18 0939)  albuterol (VENTOLIN HFA) 108 (90 Base) MCG/ACT inhaler 2 puff (2 puffs Inhalation Given 08/21/18 1336)     Initial Impression / Assessment and Plan / ED Course  I have reviewed the triage vital signs and the nursing notes.  Pertinent labs & imaging results that  were available during my care of the patient were reviewed by me and considered in my medical decision making (see chart for details).  Clinical Course as of Aug 20 1412  Sat Aug 21, 2018  1610 She has refused to cooperate with CT.  He would not lie down or remain still long enough for exam.  Chest x-ray is clear.  Vital signs remained stable.   [MP]  [phone] Patient is more agitated.  He is getting up out of bed now.  He can be redirected to get back into bed.  He continues to report his head is spinning and that he cannot breathe.  Will give Ativan for sedation to attempt a CT scan of head and obtain ABG.  Objectively, patient does not appear to be having difficulty breathing.  X-ray without acute findings.  We will continue to monitor oxygen saturations and review ABG.   [MP]  1138 Patient is sleeping but awakens easily.  He reports he starting to feel better.  Sitter reports he did ambulate to the bathroom.  She reports gait was a little unsteady but did all right.  Will observe a little longer and reassess.   [MP]    Clinical Course User Index [MP] Arby Barrette, MD      Patient has been ambulated about the emergency department.  He ambulated without assistance.  He has slightly broad-based gait.  He is alert and in no  distress.  Oxygen saturations were stable and there was no respiratory distress.  I have had a conversation with the patient's wife.  He reports that he drank a lot last night.  He drank all day starting earlier in the morning until about 8 PM.  He then went to sleep until the early hours of the morning.  He woke up at about 5 AM and said he did not feel good.  She tried to get him to rest but he kept getting up and down over and over again.  She reports she did take his medications out of the room especially his blood pressure medications because sometimes if he drinks he might start taking them in excess because he does not remember that he is taking them.  She reports she did  not note that there was anything that made her think of new stroke as he was getting up and down out of bed.  He finally got agitated with her and walked across the street to where his sister lives.  Wife reports that his sister called EMS for him.  His wife reports that his typical gait is broad-based and slow.  That is been since his stroke.  She also reports that his speech is gravelly and hard to understand which is well has been attributed to his smoking and probably alcohol use by other physicians.  She reports that the sonorous snorting kind of sounds he makes while awake are also a baseline.  She arrived intoxicated.  He was a very difficult historian.  It was very difficult to ascertain presence of acute neurologic and behavior anomaly as opposed to baseline.  Patient was evaluated for possible stroke or head injury.  CT shows no acute findings.  Other diagnostic findings are within the patient's normal baseline.  After period of observation in the emergency department patient has been rechecked multiple times.  He now is at baseline, alert and independently ambulatory.  Will discharge to family members.  Final Clinical Impressions(s) / ED Diagnoses   Final diagnoses:  Alcoholic intoxication with complication (HCC)  Dizziness  H/O: CVA (cerebrovascular accident)  Agitation    ED Discharge Orders    None       Arby Barrette, MD 08/21/18 1419

## 2018-08-21 NOTE — ED Notes (Signed)
Son here to pick patient up.

## 2018-08-21 NOTE — ED Notes (Signed)
Walked patient up the hall patient oxygen level stayed at 95 room air patient is now back in bed with sitter at bedside and call bell

## 2018-08-21 NOTE — ED Notes (Signed)
Pt continuing to pull leads, Bp cuff, and pulse ox off while yelling constantly at staff, after nurse and tech has made several attempts to put pt back on monitor the pt keeps refusing to cooperate.

## 2018-08-21 NOTE — ED Notes (Signed)
Pt transported to CT, and given a urinal and made aware we need a sample.

## 2018-08-21 NOTE — ED Notes (Signed)
Pt will be picked up by son Carron Curie.

## 2018-09-01 ENCOUNTER — Emergency Department (HOSPITAL_COMMUNITY): Payer: Medicaid Other

## 2018-09-01 ENCOUNTER — Encounter (HOSPITAL_COMMUNITY): Payer: Self-pay

## 2018-09-01 ENCOUNTER — Other Ambulatory Visit: Payer: Self-pay

## 2018-09-01 ENCOUNTER — Emergency Department (HOSPITAL_COMMUNITY)
Admission: EM | Admit: 2018-09-01 | Discharge: 2018-09-02 | Disposition: A | Discharge status: Home / Self Care | Payer: Medicaid Other | Attending: Emergency Medicine | Admitting: Emergency Medicine

## 2018-09-01 DIAGNOSIS — E876 Hypokalemia: Secondary | ICD-10-CM | POA: Diagnosis not present

## 2018-09-01 DIAGNOSIS — Y998 Other external cause status: Secondary | ICD-10-CM | POA: Diagnosis not present

## 2018-09-01 DIAGNOSIS — F1410 Cocaine abuse, uncomplicated: Secondary | ICD-10-CM | POA: Insufficient documentation

## 2018-09-01 DIAGNOSIS — I110 Hypertensive heart disease with heart failure: Secondary | ICD-10-CM | POA: Insufficient documentation

## 2018-09-01 DIAGNOSIS — Y92018 Other place in single-family (private) house as the place of occurrence of the external cause: Secondary | ICD-10-CM | POA: Insufficient documentation

## 2018-09-01 DIAGNOSIS — F10121 Alcohol abuse with intoxication delirium: Secondary | ICD-10-CM | POA: Diagnosis not present

## 2018-09-01 DIAGNOSIS — Z79899 Other long term (current) drug therapy: Secondary | ICD-10-CM | POA: Diagnosis not present

## 2018-09-01 DIAGNOSIS — I10 Essential (primary) hypertension: Secondary | ICD-10-CM

## 2018-09-01 DIAGNOSIS — Z8673 Personal history of transient ischemic attack (TIA), and cerebral infarction without residual deficits: Secondary | ICD-10-CM | POA: Insufficient documentation

## 2018-09-01 DIAGNOSIS — Y939 Activity, unspecified: Secondary | ICD-10-CM | POA: Diagnosis not present

## 2018-09-01 DIAGNOSIS — W19XXXA Unspecified fall, initial encounter: Secondary | ICD-10-CM | POA: Diagnosis not present

## 2018-09-01 DIAGNOSIS — Z20828 Contact with and (suspected) exposure to other viral communicable diseases: Secondary | ICD-10-CM | POA: Diagnosis not present

## 2018-09-01 DIAGNOSIS — F1210 Cannabis abuse, uncomplicated: Secondary | ICD-10-CM | POA: Insufficient documentation

## 2018-09-01 DIAGNOSIS — F17210 Nicotine dependence, cigarettes, uncomplicated: Secondary | ICD-10-CM | POA: Diagnosis not present

## 2018-09-01 DIAGNOSIS — I5031 Acute diastolic (congestive) heart failure: Secondary | ICD-10-CM | POA: Diagnosis not present

## 2018-09-01 DIAGNOSIS — R4182 Altered mental status, unspecified: Secondary | ICD-10-CM | POA: Diagnosis present

## 2018-09-01 LAB — CBC WITH DIFFERENTIAL/PLATELET
Abs Immature Granulocytes: 0.02 10*3/uL (ref 0.00–0.07)
Basophils Absolute: 0 10*3/uL (ref 0.0–0.1)
Basophils Relative: 0 %
Eosinophils Absolute: 0 10*3/uL (ref 0.0–0.5)
Eosinophils Relative: 0 %
HCT: 31.9 % — ABNORMAL LOW (ref 39.0–52.0)
Hemoglobin: 10.7 g/dL — ABNORMAL LOW (ref 13.0–17.0)
Immature Granulocytes: 0 %
Lymphocytes Relative: 19 %
Lymphs Abs: 1.2 10*3/uL (ref 0.7–4.0)
MCH: 28.2 pg (ref 26.0–34.0)
MCHC: 33.5 g/dL (ref 30.0–36.0)
MCV: 84.2 fL (ref 80.0–100.0)
Monocytes Absolute: 0.4 10*3/uL (ref 0.1–1.0)
Monocytes Relative: 6 %
Neutro Abs: 4.9 10*3/uL (ref 1.7–7.7)
Neutrophils Relative %: 75 %
Platelets: 176 10*3/uL (ref 150–400)
RBC: 3.79 MIL/uL — ABNORMAL LOW (ref 4.22–5.81)
RDW: 19.5 % — ABNORMAL HIGH (ref 11.5–15.5)
WBC: 6.5 10*3/uL (ref 4.0–10.5)
nRBC: 0 % (ref 0.0–0.2)

## 2018-09-01 LAB — RAPID URINE DRUG SCREEN, HOSP PERFORMED
Amphetamines: NOT DETECTED
Barbiturates: NOT DETECTED
Benzodiazepines: NOT DETECTED
Cocaine: NOT DETECTED
Opiates: NOT DETECTED
Tetrahydrocannabinol: NOT DETECTED

## 2018-09-01 LAB — COMPREHENSIVE METABOLIC PANEL
ALT: 70 U/L — ABNORMAL HIGH (ref 0–44)
AST: 136 U/L — ABNORMAL HIGH (ref 15–41)
Albumin: 2.7 g/dL — ABNORMAL LOW (ref 3.5–5.0)
Alkaline Phosphatase: 101 U/L (ref 38–126)
Anion gap: 11 (ref 5–15)
BUN: 7 mg/dL (ref 6–20)
CO2: 18 mmol/L — ABNORMAL LOW (ref 22–32)
Calcium: 8.1 mg/dL — ABNORMAL LOW (ref 8.9–10.3)
Chloride: 102 mmol/L (ref 98–111)
Creatinine, Ser: 0.93 mg/dL (ref 0.61–1.24)
GFR calc Af Amer: >60 mL/min (ref >60)
GFR calc non Af Amer: >60 mL/min (ref >60)
Glucose, Bld: 101 mg/dL — ABNORMAL HIGH (ref 70–99)
Potassium: 3 mmol/L — ABNORMAL LOW (ref 3.5–5.1)
Sodium: 131 mmol/L — ABNORMAL LOW (ref 135–145)
Total Bilirubin: 0.5 mg/dL (ref 0.3–1.2)
Total Protein: 7.2 g/dL (ref 6.5–8.1)

## 2018-09-01 LAB — ETHANOL: Alcohol, Ethyl (B): 337 mg/dL (ref <10)

## 2018-09-01 LAB — LACTIC ACID, PLASMA: Lactic Acid, Venous: 1.4 mmol/L (ref 0.5–1.9)

## 2018-09-01 LAB — LIPASE, BLOOD: Lipase: 39 U/L (ref 11–51)

## 2018-09-01 MED ORDER — AMLODIPINE BESYLATE 5 MG PO TABS
5.0000 mg | ORAL_TABLET | Freq: Once | ORAL | Status: AC
  Administered 2018-09-01: 21:00:00 5 mg via ORAL
  Filled 2018-09-01: qty 1

## 2018-09-01 MED ORDER — ALUM & MAG HYDROXIDE-SIMETH 200-200-20 MG/5ML PO SUSP
30.0000 mL | Freq: Once | ORAL | Status: AC
  Administered 2018-09-01: 30 mL via ORAL
  Filled 2018-09-01: qty 30

## 2018-09-01 MED ORDER — ALBUTEROL SULFATE HFA 108 (90 BASE) MCG/ACT IN AERS
4.0000 | INHALATION_SPRAY | Freq: Once | RESPIRATORY_TRACT | Status: DC
  Filled 2018-09-01: qty 6.7

## 2018-09-01 MED ORDER — SODIUM CHLORIDE 0.9 % IV BOLUS
1000.0000 mL | Freq: Once | INTRAVENOUS | Status: AC
  Administered 2018-09-01: 1000 mL via INTRAVENOUS

## 2018-09-01 MED ORDER — POTASSIUM CHLORIDE CRYS ER 20 MEQ PO TBCR
40.0000 meq | EXTENDED_RELEASE_TABLET | Freq: Once | ORAL | Status: AC
  Administered 2018-09-01: 40 meq via ORAL
  Filled 2018-09-01: qty 2

## 2018-09-01 NOTE — ED Triage Notes (Signed)
EMS report pt was found on porch. Family report pt was drinking all day and was found unconscious.

## 2018-09-01 NOTE — ED Provider Notes (Signed)
  Physical Exam  BP (!) 194/119   Pulse 77   Temp 98.5 F (36.9 C) (Oral)   Resp (!) 23   SpO2 93%   Physical Exam  ED Course/Procedures     Procedures  MDM  Patient handed off to be my Domenic Moras PA-C due to change of shift. Patient is a known alcoholic and sent to ED by family members who witnessed him fall. Upon my initial evaluation, patient is intoxicated and rude and yells at me to raise and lower his bed and also asks for a cigarette. Patient was monitored for several hours. Workup reveals normal head CT, chest xray. Unremarkable ekg. Patient was mildly wheezy on exam but this is likely chronic. He was given albuterol inhaler. He has chronic, asymptomatic hypotension and is non compliant with his medications. His LFTs are always elevated. Normal lipase and WBC count today. His intoxication decreased throughout the stay and he was ready to go home. He was given several resources for cessation of alcohol but he does not want to stop alcohol at this time.       Kristine Royal 09/01/18 2342    Varney Biles, MD 09/02/18 1812

## 2018-09-01 NOTE — ED Notes (Signed)
Pt presents with alcohol intoxication. Pt removed C-collar.

## 2018-09-01 NOTE — ED Notes (Signed)
Pt O2 sats in the low 90's. Pt placed on 2L of o2. Pt ripping off oxygen. And ripping off O2 sats monitor.

## 2018-09-01 NOTE — ED Provider Notes (Signed)
[facility] Provider Note   CSN: [id number] Arrival date & time: 09/01/18  1831    History   Chief Complaint Chief Complaint  Patient presents with  . Fall    HPI Willa RoughWilliam D Zimmers is a 58 y.o. male.     The history is provided by the spouse, the EMS personnel and medical records. No language interpreter was used.  Fall      58 year old male with known history of alcohol abuse brought here via EMS for evaluation of altered mental status.  Per EMS, patient was found on the porch and family report patient has been drinking all day and was found unconscious.  History is limited as patient appears intoxicated.  He did admit to drinking alcohol today quantify as a 40oz. however, patient appears intoxicated therefore level 5 caveats applies due to altered mental status.  I did talk to patient spouse via the phone.  According to the spouse, patient does have a strong history of alcohol abuse.  He normally drinks until he passed out.  Today he was passing out on the porch of his sister's house across the street.  His neighbor called his wife and wife mention to have him brought to the ER to get help for his alcohol use.  His alcohol intoxication is not something new.  No report of SI or HI.  No report of any recreational drug use.  Past Medical History:  Diagnosis Date  . Dizziness and giddiness   . Hypertension   . Shortness of breath   . Stroke (HCC)   . Syncope and collapse   . Unspecified essential hypertension     Patient Active Problem List   Diagnosis Date Noted  . Acute diastolic heart failure (HCC) 04/20/2017  . SOB (shortness of breath) 04/19/2017  . Tachypnea 04/19/2017  . Orthostatic dizziness   . Altered mental status   . AKI (acute kidney injury) (HCC)   . Hypertension   . Noncompliance with treatment   . Tobacco abuse   . Hypomagnesemia 09/07/2015  . Epigastric pain 09/07/2015  . ETOH abuse 09/07/2015  . Near syncope    . Medical non-compliance   . 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 Syncope and collapse 08/02/2012  . Facial fracture due to fall (HCC) 08/02/2012  . Hypokalemia 08/02/2012  . Acute kidney injury (HCC) 08/02/2012  . Syncope 08/02/2012  . Alcohol abuse 04/20/2006  . TOBACCO ABUSE 04/20/2006  . Essential hypertension 04/20/2006    History reviewed. No pertinent surgical history.      Home Medications    Prior to Admission medications   Medication Sig Start Date End Date Taking? Authorizing Provider  albuterol (PROVENTIL HFA;VENTOLIN HFA) 108 (90 Base) MCG/ACT inhaler Inhale 2 puffs into the lungs every 6 (six) hours as needed for wheezing or shortness of breath. 04/22/17   Hongalgi, Maximino GreenlandAnand D, MD  amLODipine (NORVASC) 5 MG tablet Take 1 tablet (5 mg total) by mouth daily. 04/22/17 05/07/18  Hongalgi, Maximino GreenlandAnand D, MD  ASPIRIN LOW DOSE 81 MG EC tablet Take 81 mg by mouth daily. 04/19/18   [provider]  carvedilol (COREG) 25 MG tablet Take 1 tablet (25 mg total) by mouth 2 (two) times daily with a meal. 11/09/17   Curatolo, Adam, DO  folic acid (FOLVITE) 1 MG tablet Take 1 tablet (1 mg total) by mouth daily. 04/23/17   Hongalgi, Maximino GreenlandAnand D, MD  furosemide (LASIX) 40 MG tablet Take 1 tablet (40 mg total) by mouth  daily. Patient not taking: Reported on 05/07/2018 04/22/17 05/22/17  Elease EtienneHongalgi, Anand D, MD  hydrALAZINE (APRESOLINE) 50 MG tablet Take 1 tablet (50 mg total) by mouth 3 (three) times daily. Patient taking differently: Take 25 mg by mouth every 8 (eight) hours.  11/09/17   Curatolo, Adam, DO  Multiple Vitamin (MULTIVITAMIN WITH MINERALS) TABS tablet Take 1 tablet by mouth daily. Patient not taking: Reported on 05/07/2018 04/23/17   Elease EtienneHongalgi, Anand D, MD  ondansetron (ZOFRAN ODT) 8 MG disintegrating tablet Take 1 tablet (8 mg total) by mouth every 8 (eight) hours as needed for nausea or vomiting. 11/25/17   Azalia Bilisampos, Kevin, MD  pantoprazole (PROTONIX) 40 MG tablet Take 1 tablet (40 mg total) by  mouth daily. Patient not taking: Reported on 05/07/2018 03/14/17   Ward, Layla MawKristen N, DO  POLY-IRON 150 150 MG capsule Take 300 mg by mouth daily. 04/19/18   [provider]  potassium chloride (K-DUR) 10 MEQ tablet Take 1 tablet (10 mEq total) by mouth daily for 7 days. 05/07/18 05/14/18  Eber HongMiller, Brian, MD  thiamine 100 MG tablet Take 1 tablet (100 mg total) by mouth daily. 04/23/17   Hongalgi, Maximino GreenlandAnand D, MD    Family History Family History  Problem Relation Age of Onset  . Cancer Mother   . Diabetes Father   . Hypertension Other     Social History Social History   Tobacco Use  . Smoking status: Current Every Day Smoker    Packs/day: 10.00    Types: Cigarettes  . Smokeless tobacco: Never Used  Substance Use Topics  . Alcohol use: Yes    Alcohol/week: 0.0 standard drinks    Comment: Smells of ETOH  St's just stopped drinking  . Drug use: Yes    Types: Marijuana, Cocaine     Allergies   Patient has no known allergies.   Review of Systems Review of Systems  Unable to perform ROS: Mental status change     Physical Exam Updated Vital Signs BP (!) 178/133   Pulse 73   Temp 98.5 F (36.9 C) (Oral)   Resp (!) 22   SpO2 97%   Physical Exam Vitals signs and nursing note reviewed.  Constitutional:      General: He is not in acute distress.    Appearance: He is well-developed.     Comments: Patient is drowsy, appears intoxicated but arousable.  HENT:     Head: Atraumatic.     Comments: No evidence of head trauma. Eyes:     Conjunctiva/sclera: Conjunctivae normal.     Pupils: Pupils are equal, round, and reactive to light.  Neck:     Musculoskeletal: Neck supple. No neck rigidity.  Cardiovascular:     Rate and Rhythm: Tachycardia present.     Pulses: Normal pulses.  Pulmonary:     Breath sounds: Rhonchi and rales present. No wheezing.  Abdominal:     General: Abdomen is flat.     Palpations: Abdomen is soft.     Tenderness: There is no abdominal  tenderness.  Skin:    Findings: No rash.  Neurological:     Mental Status: He is alert. He is disoriented.     GCS: GCS eye subscore is 4. GCS verbal subscore is 4. GCS motor subscore is 5.     Cranial Nerves: Cranial nerves are intact.     Sensory: Sensation is intact.     Motor: Motor function is intact.     Coordination: Finger-Nose-Finger Test normal.  ED Treatments / Results  Labs (all labs ordered are listed, but only abnormal results are displayed) Labs Reviewed  CBC WITH DIFFERENTIAL/PLATELET - Abnormal; Notable for the following components:      Result Value   RBC 3.79 (*)    Hemoglobin 10.7 (*)    HCT 31.9 (*)    RDW 19.5 (*)    All other components within normal limits  COMPREHENSIVE METABOLIC PANEL - Abnormal; Notable for the following components:   Sodium 131 (*)    Potassium 3.0 (*)    CO2 18 (*)    Glucose, Bld 101 (*)    Calcium 8.1 (*)    Albumin 2.7 (*)    AST 136 (*)    ALT 70 (*)    All other components within normal limits  ETHANOL - Abnormal; Notable for the following components:   Alcohol, Ethyl (B) 337 (*)    All other components within normal limits  NOVEL CORONAVIRUS, NAA (HOSPITAL ORDER, SEND-OUT TO REF LAB)  LIPASE, BLOOD  LACTIC ACID, PLASMA  RAPID URINE DRUG SCREEN, HOSP PERFORMED  LACTIC ACID, PLASMA    EKG EKG Interpretation  Date/Time:  Wednesday September 01 2018 18:35:21 EDT Ventricular Rate:  72 PR Interval:    QRS Duration: 93 QT Interval:  395 QTC Calculation: 433 R Axis:   74 Text Interpretation:  Sinus rhythm ST elevation, consider anterior injury since last tracing no significant change Confirmed by Mancel BaleWentz, Elliott [id number]) on 09/01/2018 7:11:56 PM   Radiology Dg Chest Portable 1 View  Result Date: 09/01/2018 CLINICAL DATA:  Dyspnea EXAM: PORTABLE CHEST 1 VIEW COMPARISON:  08/21/2018 chest radiograph. FINDINGS: Stable cardiomediastinal silhouette with normal heart size. No pneumothorax. No pleural effusion. Lungs appear  clear, with no acute consolidative airspace disease and no pulmonary edema. IMPRESSION: No active disease. Electronically Signed   By: Delbert PhenixJason A Poff M.D.   On: 09/01/2018 19:38    Procedures Procedures (including critical care time)  Medications Ordered in ED Medications  alum & mag hydroxide-simeth (MAALOX/MYLANTA) 200-200-20 MG/5ML suspension 30 mL (has no administration in time range)  amLODipine (NORVASC) tablet 5 mg (has no administration in time range)  sodium chloride 0.9 % bolus 1,000 mL (1,000 mLs Intravenous New Bag/Given 09/01/18 1947)  potassium chloride SA (K-DUR) CR tablet 40 mEq (40 mEq Oral Given 09/01/18 2103)     Initial Impression / Assessment and Plan / ED Course  I have reviewed the triage vital signs and the nursing notes.  Pertinent labs & imaging results that were available during my care of the patient were reviewed by me and considered in my medical decision making (see chart for details).        BP (!) 178/133   Pulse 73   Temp 98.5 F (36.9 C) (Oral)   Resp (!) 22   SpO2 97%    Final Clinical Impressions(s) / ED Diagnoses   Final diagnoses:  Alcohol intoxication delirium (HCC)  Hypokalemia  Essential hypertension    ED Discharge Orders    None     7:23 PM Patient with significant history of alcohol abuse brought here due to altered mental status likely secondary to alcohol intoxication.  I did obtain additional information from his wife via the phone.  She would like for him to receive help for his alcohol use.  On exam, patient appears intoxicated.  He does have wheezes and rhonchi on lung exam.  Concern for potential aspiration pneumonia therefore chest x-ray order, labs obtained, work-up initiated.  He  is mildly hypoxic, O2 supplemental oxygen given.  9:14 PM BP elevated, pt has hx of HTN, unsure if he takes his BP meds today, will give amlodipine 5mg .  Continue with IVF and close monitoring. Pt sign out to [employer], PA-C who will  continue to monitor pt until he is clinically sober to be discharge.  I gave pt potassium supplementation as well as IVF.    Current alcohol level is 337.  Mild transaminitis with AST 136, ALT 70 likely 2/2 alcohol abuse. CXR unremarkable.  Head CT pending.    Domenic Moras, PA-C 09/01/18 2123    Margette Fast, MD 09/02/18 1153

## 2018-09-01 NOTE — ED Notes (Signed)
Pt ambulated in room. Was able to get back into bed by self.

## 2018-09-01 NOTE — Discharge Instructions (Addendum)
Your seen today for follow-up.  This is most likely due to your heavy alcohol use.  Your head scan and your chest x-ray are normal.  It appears the alcohol drinking is affecting your liver.  It is also causing her blood pressure to be elevated.  Please avoid alcohol and also avoid Tylenol to avoid further damage to your liver. Follow up withy your regular doctor for your blood pressure. Thank you for allowing me to care for you today. Continue albuterol inhaleter for wheezing.  Please return to the emergency department if you have new or worsening symptoms. Take your medications as instructed.

## 2018-09-02 LAB — NOVEL CORONAVIRUS, NAA (HOSP ORDER, SEND-OUT TO REF LAB; TAT 18-24 HRS): SARS-CoV-2, NAA: NOT DETECTED

## 2018-09-02 NOTE — ED Notes (Addendum)
Pts family called. Spoke with the wife donna. Said they could not pick up the pt. Pt given taxi. Family waiting for pt at home.

## 2018-09-02 NOTE — ED Notes (Signed)
Pt able to ambulate to ambulate to restroom and back to room.

## 2018-09-02 NOTE — ED Notes (Signed)
All appropriate discharge materials reviewed at length with patient. Time for questions provided. Pt has no other questions at this time and verbalizes understanding of all provided materials.  

## 2018-12-02 ENCOUNTER — Emergency Department (HOSPITAL_COMMUNITY)
Admission: EM | Admit: 2018-12-02 | Discharge: 2018-12-02 | Disposition: A | Discharge status: Home / Self Care | Payer: Medicaid Other | Attending: Emergency Medicine | Admitting: Emergency Medicine

## 2018-12-02 ENCOUNTER — Other Ambulatory Visit: Payer: Self-pay

## 2018-12-02 ENCOUNTER — Encounter (HOSPITAL_COMMUNITY): Payer: Self-pay | Admitting: Emergency Medicine

## 2018-12-02 DIAGNOSIS — Z79899 Other long term (current) drug therapy: Secondary | ICD-10-CM | POA: Insufficient documentation

## 2018-12-02 DIAGNOSIS — Z7982 Long term (current) use of aspirin: Secondary | ICD-10-CM | POA: Insufficient documentation

## 2018-12-02 DIAGNOSIS — F1410 Cocaine abuse, uncomplicated: Secondary | ICD-10-CM | POA: Insufficient documentation

## 2018-12-02 DIAGNOSIS — Z8673 Personal history of transient ischemic attack (TIA), and cerebral infarction without residual deficits: Secondary | ICD-10-CM | POA: Insufficient documentation

## 2018-12-02 DIAGNOSIS — R109 Unspecified abdominal pain: Secondary | ICD-10-CM | POA: Diagnosis present

## 2018-12-02 DIAGNOSIS — F1210 Cannabis abuse, uncomplicated: Secondary | ICD-10-CM | POA: Diagnosis not present

## 2018-12-02 DIAGNOSIS — K2900 Acute gastritis without bleeding: Secondary | ICD-10-CM | POA: Diagnosis not present

## 2018-12-02 DIAGNOSIS — I10 Essential (primary) hypertension: Secondary | ICD-10-CM | POA: Insufficient documentation

## 2018-12-02 DIAGNOSIS — E876 Hypokalemia: Secondary | ICD-10-CM

## 2018-12-02 DIAGNOSIS — F17210 Nicotine dependence, cigarettes, uncomplicated: Secondary | ICD-10-CM | POA: Diagnosis not present

## 2018-12-02 LAB — URINALYSIS, ROUTINE W REFLEX MICROSCOPIC
Bilirubin Urine: NEGATIVE
Glucose, UA: NEGATIVE mg/dL
Hgb urine dipstick: NEGATIVE
Ketones, ur: NEGATIVE mg/dL
Leukocytes,Ua: NEGATIVE
Nitrite: NEGATIVE
Protein, ur: 100 mg/dL — AB
Specific Gravity, Urine: 1.005 (ref 1.005–1.030)
pH: 7 (ref 5.0–8.0)

## 2018-12-02 LAB — COMPREHENSIVE METABOLIC PANEL
ALT: 81 U/L — ABNORMAL HIGH (ref 0–44)
AST: 110 U/L — ABNORMAL HIGH (ref 15–41)
Albumin: 3.1 g/dL — ABNORMAL LOW (ref 3.5–5.0)
Alkaline Phosphatase: 100 U/L (ref 38–126)
Anion gap: 11 (ref 5–15)
BUN: 6 mg/dL (ref 6–20)
CO2: 25 mmol/L (ref 22–32)
Calcium: 9.3 mg/dL (ref 8.9–10.3)
Chloride: 101 mmol/L (ref 98–111)
Creatinine, Ser: 1.02 mg/dL (ref 0.61–1.24)
GFR calc Af Amer: >60 mL/min (ref >60)
GFR calc non Af Amer: >60 mL/min (ref >60)
Glucose, Bld: 91 mg/dL (ref 70–99)
Potassium: 3 mmol/L — ABNORMAL LOW (ref 3.5–5.1)
Sodium: 137 mmol/L (ref 135–145)
Total Bilirubin: 0.9 mg/dL (ref 0.3–1.2)
Total Protein: 8.6 g/dL — ABNORMAL HIGH (ref 6.5–8.1)

## 2018-12-02 LAB — CBC
HCT: 40.8 % (ref 39.0–52.0)
Hemoglobin: 13.8 g/dL (ref 13.0–17.0)
MCH: 28.8 pg (ref 26.0–34.0)
MCHC: 33.8 g/dL (ref 30.0–36.0)
MCV: 85.2 fL (ref 80.0–100.0)
Platelets: 207 10*3/uL (ref 150–400)
RBC: 4.79 MIL/uL (ref 4.22–5.81)
RDW: 15.9 % — ABNORMAL HIGH (ref 11.5–15.5)
WBC: 3.8 10*3/uL — ABNORMAL LOW (ref 4.0–10.5)
nRBC: 0 % (ref 0.0–0.2)

## 2018-12-02 LAB — RAPID URINE DRUG SCREEN, HOSP PERFORMED
Amphetamines: NOT DETECTED
Barbiturates: NOT DETECTED
Benzodiazepines: NOT DETECTED
Cocaine: NOT DETECTED
Opiates: NOT DETECTED
Tetrahydrocannabinol: NOT DETECTED

## 2018-12-02 LAB — ETHANOL: Alcohol, Ethyl (B): <10 mg/dL (ref <10)

## 2018-12-02 LAB — LIPASE, BLOOD: Lipase: 31 U/L (ref 11–51)

## 2018-12-02 MED ORDER — LORAZEPAM 2 MG/ML IJ SOLN: 0.0000 mg | Freq: Two times a day (BID) | INTRAMUSCULAR | Status: DC

## 2018-12-02 MED ORDER — VITAMIN B-1 100 MG PO TABS: 100.0000 mg | ORAL_TABLET | Freq: Every day | ORAL | Status: DC

## 2018-12-02 MED ORDER — LORAZEPAM 1 MG PO TABS: 0.0000 mg | ORAL_TABLET | Freq: Four times a day (QID) | ORAL | Status: DC

## 2018-12-02 MED ORDER — SODIUM CHLORIDE 0.9 % IV BOLUS
1000.0000 mL | Freq: Once | INTRAVENOUS | Status: AC
  Administered 2018-12-02: 1000 mL via INTRAVENOUS

## 2018-12-02 MED ORDER — POTASSIUM CHLORIDE ER 10 MEQ PO TBCR: 10.0000 meq | EXTENDED_RELEASE_TABLET | Freq: Every day | ORAL | 0 refills | Status: AC

## 2018-12-02 MED ORDER — POTASSIUM CHLORIDE 10 MEQ/100ML IV SOLN
10.0000 meq | Freq: Once | INTRAVENOUS | Status: AC
  Administered 2018-12-02: 10 meq via INTRAVENOUS
  Filled 2018-12-02: qty 100

## 2018-12-02 MED ORDER — SODIUM CHLORIDE 0.9% FLUSH
3.0000 mL | Freq: Once | INTRAVENOUS | Status: AC
  Administered 2018-12-02: 3 mL via INTRAVENOUS

## 2018-12-02 MED ORDER — HYDRALAZINE HCL 20 MG/ML IJ SOLN
10.0000 mg | Freq: Once | INTRAMUSCULAR | Status: AC
  Administered 2018-12-02: 11:00:00 10 mg via INTRAVENOUS
  Filled 2018-12-02: qty 1

## 2018-12-02 MED ORDER — LORAZEPAM 1 MG PO TABS: 0.0000 mg | ORAL_TABLET | Freq: Two times a day (BID) | ORAL | Status: DC

## 2018-12-02 MED ORDER — THIAMINE HCL 100 MG/ML IJ SOLN
100.0000 mg | Freq: Every day | INTRAMUSCULAR | Status: DC
  Administered 2018-12-02: 100 mg via INTRAVENOUS
  Filled 2018-12-02: qty 2

## 2018-12-02 MED ORDER — PANTOPRAZOLE SODIUM 40 MG PO TBEC: 40.0000 mg | DELAYED_RELEASE_TABLET | Freq: Every day | ORAL | 0 refills | Status: AC

## 2018-12-02 MED ORDER — LORAZEPAM 2 MG/ML IJ SOLN: 0.0000 mg | Freq: Four times a day (QID) | INTRAMUSCULAR | Status: DC

## 2018-12-02 NOTE — ED Provider Notes (Signed)
[place] EMERGENCY DEPARTMENT Provider Note   CSN: [id number] Arrival date & time: 12/02/18  5284     History   Chief Complaint Chief Complaint  Patient presents with  . Abdominal Pain    HPI Austin Mendez is a 58 y.o. male with PMHx HTN, stroke not anticoagulated, alcohol abuse who presents to the ED planing of generalized abdominal pain, nausea, "dry heaving" onset yesterday.  He also complains of generalized body aches and fatigue.  States the last time he had anything to drink was 3 to 4 days ago.  She has not been taking anything for his symptoms.  Denies fever, chills, vomiting, diarrhea, constipation, melena, hematochezia, any other associated symptoms.  No known COVID-19 positive exposures.  No recent sick contacts.  No suspicious food intake.  No recent antibiotic use.  No excessive NSAID use.        Past Medical History:  Diagnosis Date  . Dizziness and giddiness   . Hypertension   . Shortness of breath   . Stroke ([place])   . Syncope and collapse   . Unspecified essential hypertension     Patient Active Problem List   Diagnosis Date Noted  . Acute diastolic heart failure ([place]) 04/20/2017  . SOB (shortness of breath) 04/19/2017  . Tachypnea 04/19/2017  . Orthostatic dizziness   . Altered mental status   . AKI (acute kidney injury) ([place])   . Hypertension   . Noncompliance with treatment   . Tobacco abuse   . Hypomagnesemia 09/07/2015  . Epigastric pain 09/07/2015  . ETOH abuse 09/07/2015  . Near syncope   . Medical non-compliance   . 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 Syncope and collapse 08/02/2012  . Facial fracture due to fall ([place]) 08/02/2012  . Hypokalemia 08/02/2012  . Acute kidney injury ([place]) 08/02/2012  . Syncope 08/02/2012  . Alcohol abuse 04/20/2006  . TOBACCO ABUSE 04/20/2006  . Essential hypertension 04/20/2006    History reviewed. No pertinent surgical history.      Home Medications    Prior to Admission  medications   Medication Sig Start Date End Date Taking? Authorizing Provider  ASPIRIN LOW DOSE 81 MG EC tablet Take 81 mg by mouth daily. 04/19/18  Yes [provider]  carvedilol (COREG) 25 MG tablet Take 1 tablet (25 mg total) by mouth 2 (two) times daily with a meal. 11/09/17  Yes Curatolo, Adam, DO  hydrALAZINE (APRESOLINE) 25 MG tablet Take 25 mg by mouth every 8 (eight) hours. 08/20/18  Yes [provider]  albuterol (PROVENTIL HFA;VENTOLIN HFA) 108 (90 Base) MCG/ACT inhaler Inhale 2 puffs into the lungs every 6 (six) hours as needed for wheezing or shortness of breath. Patient not taking: Reported on 12/02/2018 04/22/17   Modena Jansky, MD  amLODipine (NORVASC) 5 MG tablet Take 1 tablet (5 mg total) by mouth daily. 04/22/17 05/07/18  Hongalgi, Lenis Dickinson, MD  folic acid (FOLVITE) 1 MG tablet Take 1 tablet (1 mg total) by mouth daily. Patient not taking: Reported on 12/02/2018 04/23/17   Modena Jansky, MD  furosemide (LASIX) 40 MG tablet Take 1 tablet (40 mg total) by mouth daily. Patient not taking: Reported on 05/07/2018 04/22/17 05/22/17  Modena Jansky, MD  hydrALAZINE (APRESOLINE) 50 MG tablet Take 1 tablet (50 mg total) by mouth 3 (three) times daily. Patient not taking: Reported on 12/02/2018 11/09/17   Lennice Sites, DO  Multiple Vitamin (MULTIVITAMIN WITH MINERALS) TABS tablet Take 1 tablet by mouth daily.  Patient not taking: Reported on 05/07/2018 04/23/17   Elease Etienne, MD  ondansetron (ZOFRAN ODT) 8 MG disintegrating tablet Take 1 tablet (8 mg total) by mouth every 8 (eight) hours as needed for nausea or vomiting. Patient not taking: Reported on 12/02/2018 11/25/17   Azalia Bilis, MD  pantoprazole (PROTONIX) 40 MG tablet Take 1 tablet (40 mg total) by mouth daily. Patient not taking: Reported on 05/07/2018 03/14/17   Ward, Layla Maw, DO  pantoprazole (PROTONIX) 40 MG tablet Take 1 tablet (40 mg total) by mouth daily. 12/02/18   Datra Clary, PA-C  potassium  chloride (K-DUR) 10 MEQ tablet Take 1 tablet (10 mEq total) by mouth daily for 7 days. 05/07/18 05/14/18  Eber Hong, MD  potassium chloride (K-DUR) 10 MEQ tablet Take 1 tablet (10 mEq total) by mouth daily. 12/02/18   Tanda Rockers, PA-C  thiamine 100 MG tablet Take 1 tablet (100 mg total) by mouth daily. Patient not taking: Reported on 12/02/2018 04/23/17   Elease Etienne, MD    Family History Family History  Problem Relation Age of Onset  . Cancer Mother   . Diabetes Father   . Hypertension Other     Social History Social History   Tobacco Use  . Smoking status: Current Every Day Smoker    Packs/day: 10.00    Types: Cigarettes  . Smokeless tobacco: Never Used  Substance Use Topics  . Alcohol use: Yes    Alcohol/week: 0.0 standard drinks    Comment: Smells of ETOH  St's just stopped drinking  . Drug use: Yes    Types: Marijuana, Cocaine     Allergies   Patient has no known allergies.   Review of Systems Review of Systems  Constitutional: Positive for fatigue. Negative for chills and fever.  HENT: Negative for congestion.   Eyes: Negative for redness.  Respiratory: Negative for cough.   Cardiovascular: Negative for chest pain.  Gastrointestinal: Positive for abdominal pain and nausea. Negative for blood in stool, constipation and diarrhea.  Genitourinary: Negative for difficulty urinating.  Musculoskeletal: Positive for myalgias.  Skin: Negative for rash.  Neurological: Negative for headaches.     Physical Exam Updated Vital Signs BP (!) 175/130 (BP Location: Right Arm)   Pulse 92   Temp 98.3 F (36.8 C) (Oral)   Resp 18   SpO2 100%   Physical Exam Vitals signs and nursing note reviewed.  Constitutional:      Appearance: He is not ill-appearing.     Comments: Patient appears intoxicated.  HENT:     Head: Normocephalic and atraumatic.  Eyes:     Conjunctiva/sclera: Conjunctivae normal.  Neck:     Musculoskeletal: Neck supple.  Cardiovascular:      Rate and Rhythm: Normal rate and regular rhythm.     Heart sounds: Normal heart sounds.  Pulmonary:     Effort: Pulmonary effort is normal.     Breath sounds: Normal breath sounds. No wheezing, rhonchi or rales.  Abdominal:     Palpations: Abdomen is soft. There is no shifting dullness.     Tenderness: There is generalized abdominal tenderness. There is no guarding or rebound.  Skin:    General: Skin is warm and dry.  Neurological:     Mental Status: He is alert.      ED Treatments / Results  Labs (all labs ordered are listed, but only abnormal results are displayed) Labs Reviewed  COMPREHENSIVE METABOLIC PANEL - Abnormal; Notable for the following components:  Result Value   Potassium 3.0 (*)    Total Protein 8.6 (*)    Albumin 3.1 (*)    AST 110 (*)    ALT 81 (*)    All other components within normal limits  CBC - Abnormal; Notable for the following components:   WBC 3.8 (*)    RDW 15.9 (*)    All other components within normal limits  URINALYSIS, ROUTINE W REFLEX MICROSCOPIC - Abnormal; Notable for the following components:   Protein, ur 100 (*)    Bacteria, UA RARE (*)    All other components within normal limits  LIPASE, BLOOD  ETHANOL  RAPID URINE DRUG SCREEN, HOSP PERFORMED    EKG None  Radiology No results found.  Procedures Procedures (including critical care time)  Medications Ordered in ED Medications  LORazepam (ATIVAN) injection 0-4 mg (has no administration in time range)    Or  LORazepam (ATIVAN) tablet 0-4 mg (has no administration in time range)  LORazepam (ATIVAN) injection 0-4 mg (has no administration in time range)    Or  LORazepam (ATIVAN) tablet 0-4 mg (has no administration in time range)  thiamine (VITAMIN B-1) tablet 100 mg ( Oral See Alternative 12/02/18 1050)    Or  thiamine (B-1) injection 100 mg (100 mg Intravenous Given 12/02/18 1050)  sodium chloride flush (NS) 0.9 % injection 3 mL (3 mLs Intravenous Given 12/02/18  1055)  potassium chloride 10 mEq in 100 mL IVPB (0 mEq Intravenous Stopped 12/02/18 1055)  sodium chloride 0.9 % bolus 1,000 mL (0 mLs Intravenous Stopped 12/02/18 1221)  hydrALAZINE (APRESOLINE) injection 10 mg (10 mg Intravenous Given 12/02/18 1052)     Initial Impression / Assessment and Plan / ED Course  I have reviewed the triage vital signs and the nursing notes.  Pertinent labs & imaging results that were available during my care of the patient were reviewed by me and considered in my medical decision making (see chart for details).    58 year old male who presents to the ED complaining of generalized abdominal pain and nausea.  He does have a significant history for alcohol abuse but states he has not had any alcohol in 3 to 4 days.  Question if this is accurate as patient appears intoxicated on my exam.  Concern for withdrawal if patient has not had any alcohol in 3 to 4 days.  CIWA protocol initiated.  he has mild diffuse abdominal tenderness on exam without peritoneal signs.  Doubt acute abdomen at this time.  He is afebrile without tachycardia or tachypnea.  He is mildly hypertensive on exam.  Difficult to understand if patient is taking his medications as he is intoxicated and slurring his speech.  Pressure in the ED 175/130.  Will give hydralazine. Blood work obtained prior to being seen.  Patient does have hypokalemia today at 3.0.  Will replete.  AST and ALT mildly elevated but this appears to be around patient's baseline.  Lipase negative.  We will continue to monitor.  ETOH negative.  Patient CIWA score of 4.  Staff informed that patient requesting something to eat at this time.  Will oblige.  If able to keep food down he can be discharged home.  Suspect gastritis today.   Pt able to tolerate food without issue. Urine without infection and UDS negative. PT given prescriptions for protonix as well as KDUR. He has blood pressure medication at home although suspect noncompliance. Pt  advised to follow up with his PCP. Strict return precautions  discussed. He is in agreement with plan and stable for discharge at this time.          Final Clinical Impressions(s) / ED Diagnoses   Final diagnoses:  Acute gastritis without hemorrhage, unspecified gastritis type  Hypokalemia  Essential hypertension    ED Discharge Orders         Ordered    pantoprazole (PROTONIX) 40 MG tablet  Daily     12/02/18 1203    potassium chloride (K-DUR) 10 MEQ tablet  Daily     12/02/18 [address]           Evee Liska, PA-C 12/02/18 1240    Pricilla LovelessGoldston, Scott, MD 12/03/18 [phone]

## 2018-12-02 NOTE — ED Notes (Signed)
Pt refusing discharge vital signs. Paperwork and medications reviewed with patient. Pt refused to sign for disposition. Pt discharged.

## 2018-12-02 NOTE — ED Triage Notes (Signed)
Patient reports mid abdominal pain with emesis onset yesterday , denies diarrhea /no fever , patient added generalized body aches and fatigue. Hypertensive at triage .

## 2018-12-02 NOTE — Discharge Instructions (Signed)
You were seen in the ED today for abdominal pain and nausea Your pain may be related to irritation from drinking alcohol  Your labwork was reassuring today besides your potassium which was mildly decreased  I have prescribed potassium for you to take as well as medication to help coat your stomach. Please pick up at your pharmacy and take as prescribed.  Follow up with your PCP.

## 2019-02-14 ENCOUNTER — Emergency Department (HOSPITAL_COMMUNITY): Payer: Medicaid Other

## 2019-02-14 ENCOUNTER — Other Ambulatory Visit: Payer: Self-pay

## 2019-02-14 ENCOUNTER — Inpatient Hospital Stay (HOSPITAL_COMMUNITY)
Admission: EM | Admit: 2019-02-14 | Discharge: 2019-03-25 | DRG: 957 | Disposition: E | Payer: Medicaid Other | Attending: Surgery | Admitting: Surgery

## 2019-02-14 ENCOUNTER — Encounter (HOSPITAL_COMMUNITY): Payer: Self-pay | Admitting: Emergency Medicine

## 2019-02-14 DIAGNOSIS — S82831A Other fracture of upper and lower end of right fibula, initial encounter for closed fracture: Secondary | ICD-10-CM | POA: Diagnosis present

## 2019-02-14 DIAGNOSIS — M7989 Other specified soft tissue disorders: Secondary | ICD-10-CM | POA: Diagnosis not present

## 2019-02-14 DIAGNOSIS — Z7409 Other reduced mobility: Secondary | ICD-10-CM | POA: Diagnosis not present

## 2019-02-14 DIAGNOSIS — F17200 Nicotine dependence, unspecified, uncomplicated: Secondary | ICD-10-CM | POA: Diagnosis present

## 2019-02-14 DIAGNOSIS — E8889 Other specified metabolic disorders: Secondary | ICD-10-CM | POA: Diagnosis present

## 2019-02-14 DIAGNOSIS — S8254XA Nondisplaced fracture of medial malleolus of right tibia, initial encounter for closed fracture: Secondary | ICD-10-CM | POA: Diagnosis not present

## 2019-02-14 DIAGNOSIS — Z23 Encounter for immunization: Secondary | ICD-10-CM

## 2019-02-14 DIAGNOSIS — Z7189 Other specified counseling: Secondary | ICD-10-CM

## 2019-02-14 DIAGNOSIS — J9601 Acute respiratory failure with hypoxia: Secondary | ICD-10-CM | POA: Diagnosis not present

## 2019-02-14 DIAGNOSIS — S0003XA Contusion of scalp, initial encounter: Secondary | ICD-10-CM | POA: Diagnosis present

## 2019-02-14 DIAGNOSIS — S2241XA Multiple fractures of ribs, right side, initial encounter for closed fracture: Secondary | ICD-10-CM | POA: Diagnosis present

## 2019-02-14 DIAGNOSIS — N189 Chronic kidney disease, unspecified: Secondary | ICD-10-CM | POA: Diagnosis present

## 2019-02-14 DIAGNOSIS — J9690 Respiratory failure, unspecified, unspecified whether with hypoxia or hypercapnia: Secondary | ICD-10-CM

## 2019-02-14 DIAGNOSIS — S42201A Unspecified fracture of upper end of right humerus, initial encounter for closed fracture: Secondary | ICD-10-CM

## 2019-02-14 DIAGNOSIS — Z515 Encounter for palliative care: Secondary | ICD-10-CM | POA: Diagnosis not present

## 2019-02-14 DIAGNOSIS — R0603 Acute respiratory distress: Secondary | ICD-10-CM | POA: Diagnosis not present

## 2019-02-14 DIAGNOSIS — R000 Tachycardia, unspecified: Secondary | ICD-10-CM | POA: Diagnosis not present

## 2019-02-14 DIAGNOSIS — S32059A Unspecified fracture of fifth lumbar vertebra, initial encounter for closed fracture: Secondary | ICD-10-CM | POA: Diagnosis present

## 2019-02-14 DIAGNOSIS — F10239 Alcohol dependence with withdrawal, unspecified: Secondary | ICD-10-CM | POA: Diagnosis not present

## 2019-02-14 DIAGNOSIS — E876 Hypokalemia: Secondary | ICD-10-CM | POA: Diagnosis present

## 2019-02-14 DIAGNOSIS — F10229 Alcohol dependence with intoxication, unspecified: Secondary | ICD-10-CM | POA: Diagnosis present

## 2019-02-14 DIAGNOSIS — J449 Chronic obstructive pulmonary disease, unspecified: Secondary | ICD-10-CM | POA: Diagnosis present

## 2019-02-14 DIAGNOSIS — S36892A Contusion of other intra-abdominal organs, initial encounter: Secondary | ICD-10-CM | POA: Diagnosis present

## 2019-02-14 DIAGNOSIS — G92 Toxic encephalopathy: Secondary | ICD-10-CM | POA: Diagnosis not present

## 2019-02-14 DIAGNOSIS — E870 Hyperosmolality and hypernatremia: Secondary | ICD-10-CM | POA: Diagnosis not present

## 2019-02-14 DIAGNOSIS — D62 Acute posthemorrhagic anemia: Secondary | ICD-10-CM | POA: Diagnosis not present

## 2019-02-14 DIAGNOSIS — N179 Acute kidney failure, unspecified: Secondary | ICD-10-CM | POA: Diagnosis not present

## 2019-02-14 DIAGNOSIS — R0602 Shortness of breath: Secondary | ICD-10-CM

## 2019-02-14 DIAGNOSIS — S0031XA Abrasion of nose, initial encounter: Secondary | ICD-10-CM | POA: Diagnosis present

## 2019-02-14 DIAGNOSIS — Z789 Other specified health status: Secondary | ICD-10-CM | POA: Diagnosis not present

## 2019-02-14 DIAGNOSIS — T148XXA Other injury of unspecified body region, initial encounter: Secondary | ICD-10-CM

## 2019-02-14 DIAGNOSIS — G319 Degenerative disease of nervous system, unspecified: Secondary | ICD-10-CM | POA: Diagnosis present

## 2019-02-14 DIAGNOSIS — Y92410 Unspecified street and highway as the place of occurrence of the external cause: Secondary | ICD-10-CM

## 2019-02-14 DIAGNOSIS — E512 Wernicke's encephalopathy: Secondary | ICD-10-CM | POA: Diagnosis not present

## 2019-02-14 DIAGNOSIS — I129 Hypertensive chronic kidney disease with stage 1 through stage 4 chronic kidney disease, or unspecified chronic kidney disease: Secondary | ICD-10-CM | POA: Diagnosis present

## 2019-02-14 DIAGNOSIS — Z66 Do not resuscitate: Secondary | ICD-10-CM

## 2019-02-14 DIAGNOSIS — R4182 Altered mental status, unspecified: Secondary | ICD-10-CM | POA: Diagnosis not present

## 2019-02-14 DIAGNOSIS — Z20828 Contact with and (suspected) exposure to other viral communicable diseases: Secondary | ICD-10-CM | POA: Diagnosis present

## 2019-02-14 DIAGNOSIS — S060X0A Concussion without loss of consciousness, initial encounter: Secondary | ICD-10-CM | POA: Diagnosis present

## 2019-02-14 DIAGNOSIS — S32591A Other specified fracture of right pubis, initial encounter for closed fracture: Secondary | ICD-10-CM | POA: Diagnosis present

## 2019-02-14 DIAGNOSIS — Z01818 Encounter for other preprocedural examination: Secondary | ICD-10-CM

## 2019-02-14 DIAGNOSIS — Z419 Encounter for procedure for purposes other than remedying health state, unspecified: Secondary | ICD-10-CM

## 2019-02-14 DIAGNOSIS — S8251XA Displaced fracture of medial malleolus of right tibia, initial encounter for closed fracture: Secondary | ICD-10-CM | POA: Diagnosis present

## 2019-02-14 DIAGNOSIS — Z4659 Encounter for fitting and adjustment of other gastrointestinal appliance and device: Secondary | ICD-10-CM

## 2019-02-14 DIAGNOSIS — S2231XA Fracture of one rib, right side, initial encounter for closed fracture: Secondary | ICD-10-CM

## 2019-02-14 DIAGNOSIS — J9600 Acute respiratory failure, unspecified whether with hypoxia or hypercapnia: Secondary | ICD-10-CM | POA: Diagnosis not present

## 2019-02-14 DIAGNOSIS — L299 Pruritus, unspecified: Secondary | ICD-10-CM | POA: Diagnosis present

## 2019-02-14 DIAGNOSIS — S32599A Other specified fracture of unspecified pubis, initial encounter for closed fracture: Secondary | ICD-10-CM

## 2019-02-14 HISTORY — DX: Unspecified fracture of upper end of right humerus, initial encounter for closed fracture: S42.201A

## 2019-02-14 LAB — COMPREHENSIVE METABOLIC PANEL
ALT: 78 U/L — ABNORMAL HIGH (ref 0–44)
AST: 119 U/L — ABNORMAL HIGH (ref 15–41)
Albumin: 2.8 g/dL — ABNORMAL LOW (ref 3.5–5.0)
Alkaline Phosphatase: 81 U/L (ref 38–126)
Anion gap: 12 (ref 5–15)
BUN: 7 mg/dL (ref 6–20)
CO2: 25 mmol/L (ref 22–32)
Calcium: 8.4 mg/dL — ABNORMAL LOW (ref 8.9–10.3)
Chloride: 97 mmol/L — ABNORMAL LOW (ref 98–111)
Creatinine, Ser: 1.03 mg/dL (ref 0.61–1.24)
GFR calc Af Amer: >60 mL/min (ref >60)
GFR calc non Af Amer: >60 mL/min (ref >60)
Glucose, Bld: 94 mg/dL (ref 70–99)
Potassium: 2.8 mmol/L — ABNORMAL LOW (ref 3.5–5.1)
Sodium: 134 mmol/L — ABNORMAL LOW (ref 135–145)
Total Bilirubin: 0.9 mg/dL (ref 0.3–1.2)
Total Protein: 7.4 g/dL (ref 6.5–8.1)

## 2019-02-14 LAB — CBC WITH DIFFERENTIAL/PLATELET
Abs Immature Granulocytes: 0.03 10*3/uL (ref 0.00–0.07)
Basophils Absolute: 0.1 10*3/uL (ref 0.0–0.1)
Basophils Relative: 1 %
Eosinophils Absolute: 0.1 10*3/uL (ref 0.0–0.5)
Eosinophils Relative: 2 %
HCT: 37.5 % — ABNORMAL LOW (ref 39.0–52.0)
Hemoglobin: 12.4 g/dL — ABNORMAL LOW (ref 13.0–17.0)
Immature Granulocytes: 1 %
Lymphocytes Relative: 43 %
Lymphs Abs: 2.6 10*3/uL (ref 0.7–4.0)
MCH: 28.8 pg (ref 26.0–34.0)
MCHC: 33.1 g/dL (ref 30.0–36.0)
MCV: 87.2 fL (ref 80.0–100.0)
Monocytes Absolute: 0.7 10*3/uL (ref 0.1–1.0)
Monocytes Relative: 12 %
Neutro Abs: 2.4 10*3/uL (ref 1.7–7.7)
Neutrophils Relative %: 41 %
Platelets: 257 10*3/uL (ref 150–400)
RBC: 4.3 MIL/uL (ref 4.22–5.81)
RDW: 17.1 % — ABNORMAL HIGH (ref 11.5–15.5)
WBC: 5.9 10*3/uL (ref 4.0–10.5)
nRBC: 0 % (ref 0.0–0.2)

## 2019-02-14 LAB — ABO/RH: ABO/RH(D): A POS

## 2019-02-14 LAB — LACTIC ACID, PLASMA: Lactic Acid, Venous: 2.9 mmol/L (ref 0.5–1.9)

## 2019-02-14 LAB — TYPE AND SCREEN
ABO/RH(D): A POS
Antibody Screen: NEGATIVE

## 2019-02-14 LAB — SARS CORONAVIRUS 2 BY RT PCR (HOSPITAL ORDER, PERFORMED IN ~~LOC~~ HOSPITAL LAB): SARS Coronavirus 2: NEGATIVE

## 2019-02-14 LAB — ETHANOL: Alcohol, Ethyl (B): 140 mg/dL — ABNORMAL HIGH (ref <10)

## 2019-02-14 MED ORDER — FENTANYL CITRATE (PF) 100 MCG/2ML IJ SOLN
INTRAMUSCULAR | Status: AC
  Filled 2019-02-14: qty 2

## 2019-02-14 MED ORDER — IOHEXOL 300 MG/ML  SOLN
100.0000 mL | Freq: Once | INTRAMUSCULAR | Status: AC | PRN
  Administered 2019-02-14: 100 mL via INTRAVENOUS

## 2019-02-14 MED ORDER — POTASSIUM CHLORIDE 10 MEQ/100ML IV SOLN
10.0000 meq | INTRAVENOUS | Status: AC
  Administered 2019-02-14 – 2019-02-15 (×3): 10 meq via INTRAVENOUS
  Filled 2019-02-14 (×3): qty 100

## 2019-02-14 MED ORDER — FENTANYL CITRATE (PF) 100 MCG/2ML IJ SOLN
100.0000 ug | Freq: Once | INTRAMUSCULAR | Status: AC
  Administered 2019-02-14: 100 ug via INTRAVENOUS

## 2019-02-14 MED ORDER — SODIUM CHLORIDE 0.9 % IV SOLN
Freq: Once | INTRAVENOUS | Status: AC
  Administered 2019-02-14: 21:00:00 via INTRAVENOUS

## 2019-02-14 MED ORDER — TETANUS-DIPHTH-ACELL PERTUSSIS 5-2.5-18.5 LF-MCG/0.5 IM SUSP
0.5000 mL | Freq: Once | INTRAMUSCULAR | Status: AC
  Administered 2019-02-14: 0.5 mL via INTRAMUSCULAR
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
## 2019-02-14 NOTE — Progress Notes (Signed)
Orthopedic Tech Progress Note Patient Details:  Austin Mendez [birth year] [id number]  Ortho Devices Type of Ortho Device: Ace wrap, Coapt, Shoulder immobilizer Ortho Device/Splint Location: RUE Ortho Device/Splint Interventions: Ordered, Application   Post Interventions Patient Tolerated: Fair Instructions Provided: Care of device   Austin Mendez N Austin Mendez 01/28/2019, 11:01 PM
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
## 2019-02-14 NOTE — Progress Notes (Signed)
Orthopedic Tech Progress Note Patient Details:  Austin Mendez 03-08-1961 [id number] ED Trauma Level 2 Patient ID: Austin Mendez, male   DOB: Dec 03, 1960, 58 y.o.   MRN: [id number]   Austin Mendez 02/20/2019, 9:04 PM

## 2019-02-15 ENCOUNTER — Inpatient Hospital Stay (HOSPITAL_COMMUNITY): Payer: Medicaid Other

## 2019-02-15 ENCOUNTER — Inpatient Hospital Stay (HOSPITAL_COMMUNITY): Payer: Medicaid Other | Admitting: Anesthesiology

## 2019-02-15 ENCOUNTER — Encounter (HOSPITAL_COMMUNITY): Admission: EM | Disposition: E | Payer: Self-pay | Source: Home / Self Care

## 2019-02-15 HISTORY — PX: ORIF HUMERUS FRACTURE: SHX2126

## 2019-02-15 HISTORY — PX: ORIF TIBIA FRACTURE: SHX5416

## 2019-02-15 LAB — COMPREHENSIVE METABOLIC PANEL
ALT: 72 U/L — ABNORMAL HIGH (ref 0–44)
AST: 111 U/L — ABNORMAL HIGH (ref 15–41)
Albumin: 2.7 g/dL — ABNORMAL LOW (ref 3.5–5.0)
Alkaline Phosphatase: 73 U/L (ref 38–126)
Anion gap: 12 (ref 5–15)
BUN: 8 mg/dL (ref 6–20)
CO2: 21 mmol/L — ABNORMAL LOW (ref 22–32)
Calcium: 7.8 mg/dL — ABNORMAL LOW (ref 8.9–10.3)
Chloride: 100 mmol/L (ref 98–111)
Creatinine, Ser: 1.02 mg/dL (ref 0.61–1.24)
GFR calc Af Amer: >60 mL/min (ref >60)
GFR calc non Af Amer: >60 mL/min (ref >60)
Glucose, Bld: 110 mg/dL — ABNORMAL HIGH (ref 70–99)
Potassium: 3.1 mmol/L — ABNORMAL LOW (ref 3.5–5.1)
Sodium: 133 mmol/L — ABNORMAL LOW (ref 135–145)
Total Bilirubin: 1.1 mg/dL (ref 0.3–1.2)
Total Protein: 7.2 g/dL (ref 6.5–8.1)

## 2019-02-15 LAB — HIV ANTIBODY (ROUTINE TESTING W REFLEX): HIV Screen 4th Generation wRfx: NONREACTIVE

## 2019-02-15 LAB — MAGNESIUM: Magnesium: 1.4 mg/dL — ABNORMAL LOW (ref 1.7–2.4)

## 2019-02-15 LAB — PHOSPHORUS: Phosphorus: 2.9 mg/dL (ref 2.5–4.6)

## 2019-02-15 LAB — SURGICAL PCR SCREEN
MRSA, PCR: NEGATIVE
Staphylococcus aureus: NEGATIVE

## 2019-02-15 LAB — CBC
HCT: 34.8 % — ABNORMAL LOW (ref 39.0–52.0)
Hemoglobin: 11.6 g/dL — ABNORMAL LOW (ref 13.0–17.0)
MCH: 28.6 pg (ref 26.0–34.0)
MCHC: 33.3 g/dL (ref 30.0–36.0)
MCV: 85.7 fL (ref 80.0–100.0)
Platelets: 234 10*3/uL (ref 150–400)
RBC: 4.06 MIL/uL — ABNORMAL LOW (ref 4.22–5.81)
RDW: 17 % — ABNORMAL HIGH (ref 11.5–15.5)
WBC: 7.9 10*3/uL (ref 4.0–10.5)
nRBC: 0 % (ref 0.0–0.2)

## 2019-02-15 LAB — LACTIC ACID, PLASMA: Lactic Acid, Venous: 2.7 mmol/L (ref 0.5–1.9)

## 2019-02-15 SURGERY — OPEN REDUCTION INTERNAL FIXATION (ORIF) PROXIMAL HUMERUS FRACTURE
Anesthesia: General | Site: Shoulder | Laterality: Right

## 2019-02-15 MED ORDER — OXYCODONE HCL 5 MG PO TABS
10.0000 mg | ORAL_TABLET | ORAL | Status: DC | PRN
  Administered 2019-02-16 – 2019-02-17 (×3): 10 mg via ORAL
  Filled 2019-02-15 (×5): qty 2

## 2019-02-15 MED ORDER — SUCCINYLCHOLINE CHLORIDE 20 MG/ML IJ SOLN
INTRAMUSCULAR | Status: DC | PRN
  Administered 2019-02-15: 120 mg via INTRAVENOUS

## 2019-02-15 MED ORDER — POVIDONE-IODINE 10 % EX SWAB: 2.0000 "application " | Freq: Once | CUTANEOUS | Status: DC

## 2019-02-15 MED ORDER — LABETALOL HCL 5 MG/ML IV SOLN
INTRAVENOUS | Status: AC
  Filled 2019-02-15: qty 4

## 2019-02-15 MED ORDER — MIDAZOLAM HCL 2 MG/2ML IJ SOLN
INTRAMUSCULAR | Status: AC
  Filled 2019-02-15: qty 2

## 2019-02-15 MED ORDER — FENTANYL CITRATE (PF) 250 MCG/5ML IJ SOLN
INTRAMUSCULAR | Status: AC
  Filled 2019-02-15: qty 5

## 2019-02-15 MED ORDER — PNEUMOCOCCAL VAC POLYVALENT 25 MCG/0.5ML IJ INJ
0.5000 mL | INJECTION | INTRAMUSCULAR | Status: AC
  Administered 2019-02-16: 0.5 mL via INTRAMUSCULAR
  Filled 2019-02-15: qty 0.5

## 2019-02-15 MED ORDER — 0.9 % SODIUM CHLORIDE (POUR BTL) OPTIME
TOPICAL | Status: DC | PRN
  Administered 2019-02-15: 1000 mL

## 2019-02-15 MED ORDER — LORAZEPAM 2 MG/ML IJ SOLN
1.0000 mg | INTRAMUSCULAR | Status: AC | PRN
  Administered 2019-02-15 – 2019-02-17 (×3): 2 mg via INTRAVENOUS
  Administered 2019-02-18: 1 mg via INTRAVENOUS
  Filled 2019-02-15: qty 1

## 2019-02-15 MED ORDER — THIAMINE HCL 100 MG/ML IJ SOLN
100.0000 mg | Freq: Every day | INTRAMUSCULAR | Status: DC
  Administered 2019-02-19 – 2019-02-23 (×4): 100 mg via INTRAVENOUS
  Filled 2019-02-15 (×5): qty 2

## 2019-02-15 MED ORDER — PROPOFOL 10 MG/ML IV BOLUS
INTRAVENOUS | Status: AC
  Filled 2019-02-15: qty 20

## 2019-02-15 MED ORDER — VITAMIN B-1 100 MG PO TABS
100.0000 mg | ORAL_TABLET | Freq: Every day | ORAL | Status: DC
  Administered 2019-02-16 – 2019-02-21 (×4): 100 mg via ORAL
  Filled 2019-02-15 (×4): qty 1

## 2019-02-15 MED ORDER — PANTOPRAZOLE SODIUM 40 MG PO TBEC: 40.0000 mg | DELAYED_RELEASE_TABLET | Freq: Every day | ORAL | Status: DC

## 2019-02-15 MED ORDER — ENSURE PRE-SURGERY PO LIQD
296.0000 mL | Freq: Once | ORAL | Status: AC
  Administered 2019-02-15: 296 mL via ORAL
  Filled 2019-02-15: qty 296

## 2019-02-15 MED ORDER — HYDROMORPHONE HCL 1 MG/ML IJ SOLN
1.0000 mg | Freq: Once | INTRAMUSCULAR | Status: AC
  Administered 2019-02-15: 1 mg via INTRAVENOUS
  Filled 2019-02-15: qty 1

## 2019-02-15 MED ORDER — HYDROMORPHONE HCL 1 MG/ML IJ SOLN
1.0000 mg | INTRAMUSCULAR | Status: DC | PRN
  Administered 2019-02-15 – 2019-02-27 (×15): 1 mg via INTRAVENOUS
  Filled 2019-02-15 (×16): qty 1

## 2019-02-15 MED ORDER — ALBUTEROL SULFATE (2.5 MG/3ML) 0.083% IN NEBU
INHALATION_SOLUTION | RESPIRATORY_TRACT | Status: AC
  Administered 2019-02-15: 2.5 mg via RESPIRATORY_TRACT
  Filled 2019-02-15: qty 3

## 2019-02-15 MED ORDER — FENTANYL CITRATE (PF) 250 MCG/5ML IJ SOLN
INTRAMUSCULAR | Status: DC | PRN
  Administered 2019-02-15: 100 ug via INTRAVENOUS
  Administered 2019-02-15 (×2): 50 ug via INTRAVENOUS

## 2019-02-15 MED ORDER — CHLORHEXIDINE GLUCONATE 4 % EX LIQD
60.0000 mL | Freq: Once | CUTANEOUS | Status: AC
  Administered 2019-02-15: 4 via TOPICAL
  Filled 2019-02-15: qty 60

## 2019-02-15 MED ORDER — OXYCODONE HCL 5 MG PO TABS: 5.0000 mg | ORAL_TABLET | ORAL | Status: DC | PRN

## 2019-02-15 MED ORDER — METOPROLOL TARTRATE 5 MG/5ML IV SOLN
5.0000 mg | Freq: Four times a day (QID) | INTRAVENOUS | Status: AC | PRN
  Administered 2019-02-15 – 2019-02-16 (×4): 5 mg via INTRAVENOUS
  Filled 2019-02-15 (×4): qty 5

## 2019-02-15 MED ORDER — DEXMEDETOMIDINE HCL 200 MCG/2ML IV SOLN
INTRAVENOUS | Status: DC | PRN
  Administered 2019-02-15 (×3): 8 ug via INTRAVENOUS

## 2019-02-15 MED ORDER — POTASSIUM CHLORIDE IN NACL 20-0.9 MEQ/L-% IV SOLN
INTRAVENOUS | Status: DC
  Administered 2019-02-15 – 2019-02-21 (×8): via INTRAVENOUS
  Filled 2019-02-15 (×12): qty 1000

## 2019-02-15 MED ORDER — LABETALOL HCL 5 MG/ML IV SOLN
20.0000 mg | INTRAVENOUS | Status: DC | PRN
  Administered 2019-02-15: 15 mg via INTRAVENOUS

## 2019-02-15 MED ORDER — ONDANSETRON HCL 4 MG/2ML IJ SOLN
4.0000 mg | Freq: Four times a day (QID) | INTRAMUSCULAR | Status: DC | PRN
  Administered 2019-02-15: 4 mg via INTRAVENOUS

## 2019-02-15 MED ORDER — ADULT MULTIVITAMIN W/MINERALS CH
1.0000 | ORAL_TABLET | Freq: Every day | ORAL | Status: DC
  Administered 2019-02-16 – 2019-02-19 (×3): 1 via ORAL
  Filled 2019-02-15 (×4): qty 1

## 2019-02-15 MED ORDER — LORAZEPAM 2 MG/ML IJ SOLN
0.0000 mg | Freq: Two times a day (BID) | INTRAMUSCULAR | Status: AC
  Filled 2019-02-15: qty 1

## 2019-02-15 MED ORDER — FOLIC ACID 1 MG PO TABS
1.0000 mg | ORAL_TABLET | Freq: Every day | ORAL | Status: DC
  Administered 2019-02-16 – 2019-02-19 (×4): 1 mg via ORAL
  Filled 2019-02-15 (×4): qty 1

## 2019-02-15 MED ORDER — PROPOFOL 10 MG/ML IV BOLUS
INTRAVENOUS | Status: DC | PRN
  Administered 2019-02-15: 200 mg via INTRAVENOUS

## 2019-02-15 MED ORDER — PHENYLEPHRINE HCL-NACL 10-0.9 MG/250ML-% IV SOLN
INTRAVENOUS | Status: DC | PRN
  Administered 2019-02-15: 25 ug/min via INTRAVENOUS

## 2019-02-15 MED ORDER — NICOTINE 14 MG/24HR TD PT24
14.0000 mg | MEDICATED_PATCH | Freq: Every day | TRANSDERMAL | Status: DC
  Administered 2019-02-16: 14 mg via TRANSDERMAL
  Filled 2019-02-15: qty 1

## 2019-02-15 MED ORDER — POTASSIUM CHLORIDE 20 MEQ/15ML (10%) PO SOLN: 40.0000 meq | Freq: Once | ORAL | Status: DC

## 2019-02-15 MED ORDER — LACTATED RINGERS IV SOLN
INTRAVENOUS | Status: DC
  Administered 2019-02-15 (×2): via INTRAVENOUS

## 2019-02-15 MED ORDER — CEFAZOLIN SODIUM-DEXTROSE 2-4 GM/100ML-% IV SOLN
2.0000 g | INTRAVENOUS | Status: AC
  Administered 2019-02-15: 12:00:00 2 g via INTRAVENOUS
  Filled 2019-02-15: qty 100

## 2019-02-15 MED ORDER — LORAZEPAM 2 MG/ML IJ SOLN
0.0000 mg | Freq: Four times a day (QID) | INTRAMUSCULAR | Status: AC
  Administered 2019-02-15: 1 mg via INTRAVENOUS
  Administered 2019-02-17 (×2): 2 mg via INTRAVENOUS
  Filled 2019-02-15 (×5): qty 1

## 2019-02-15 MED ORDER — MIDAZOLAM HCL 5 MG/5ML IJ SOLN
INTRAMUSCULAR | Status: DC | PRN
  Administered 2019-02-15: 2 mg via INTRAVENOUS

## 2019-02-15 MED ORDER — ACETAMINOPHEN 325 MG PO TABS: 650.0000 mg | ORAL_TABLET | ORAL | Status: DC | PRN

## 2019-02-15 MED ORDER — INFLUENZA VAC SPLIT QUAD 0.5 ML IM SUSY
0.5000 mL | PREFILLED_SYRINGE | INTRAMUSCULAR | Status: AC
  Administered 2019-02-16: 0.5 mL via INTRAMUSCULAR
  Filled 2019-02-15: qty 0.5

## 2019-02-15 MED ORDER — POTASSIUM PHOSPHATES 15 MMOLE/5ML IV SOLN
10.0000 mmol | Freq: Once | INTRAVENOUS | Status: DC
  Filled 2019-02-15: qty 3.33

## 2019-02-15 MED ORDER — CEFAZOLIN SODIUM-DEXTROSE 2-4 GM/100ML-% IV SOLN
2.0000 g | Freq: Three times a day (TID) | INTRAVENOUS | Status: AC
  Administered 2019-02-15 – 2019-02-16 (×3): 2 g via INTRAVENOUS
  Filled 2019-02-15 (×3): qty 100

## 2019-02-15 MED ORDER — AMLODIPINE BESYLATE 10 MG PO TABS
10.0000 mg | ORAL_TABLET | Freq: Every day | ORAL | Status: DC
  Administered 2019-02-16 – 2019-02-19 (×3): 10 mg via ORAL
  Filled 2019-02-15 (×4): qty 1

## 2019-02-15 MED ORDER — LORAZEPAM 1 MG PO TABS: 1.0000 mg | ORAL_TABLET | ORAL | Status: AC | PRN

## 2019-02-15 MED ORDER — ALBUTEROL SULFATE (2.5 MG/3ML) 0.083% IN NEBU
2.5000 mg | INHALATION_SOLUTION | Freq: Once | RESPIRATORY_TRACT | Status: AC
  Administered 2019-02-15: 10:00:00 2.5 mg via RESPIRATORY_TRACT

## 2019-02-15 MED ORDER — ROCURONIUM BROMIDE 10 MG/ML (PF) SYRINGE
PREFILLED_SYRINGE | INTRAVENOUS | Status: DC | PRN
  Administered 2019-02-15: 60 mg via INTRAVENOUS
  Administered 2019-02-15: 20 mg via INTRAVENOUS

## 2019-02-15 MED ORDER — ENOXAPARIN SODIUM 30 MG/0.3ML ~~LOC~~ SOLN
30.0000 mg | Freq: Two times a day (BID) | SUBCUTANEOUS | Status: DC
  Administered 2019-02-15 – 2019-02-27 (×22): 30 mg via SUBCUTANEOUS
  Filled 2019-02-15 (×23): qty 0.3

## 2019-02-15 MED ORDER — LIDOCAINE 2% (20 MG/ML) 5 ML SYRINGE
INTRAMUSCULAR | Status: DC | PRN
  Administered 2019-02-15: 60 mg via INTRAVENOUS

## 2019-02-15 MED ORDER — ONDANSETRON 4 MG PO TBDP: 4.0000 mg | ORAL_TABLET | Freq: Four times a day (QID) | ORAL | Status: DC | PRN

## 2019-02-15 MED ORDER — MAGNESIUM SULFATE 2 GM/50ML IV SOLN: 2.0000 g | Freq: Once | INTRAVENOUS | Status: DC

## 2019-02-15 MED ORDER — SUGAMMADEX SODIUM 200 MG/2ML IV SOLN
INTRAVENOUS | Status: DC | PRN
  Administered 2019-02-15: 200 mg via INTRAVENOUS

## 2019-02-15 MED ORDER — PANTOPRAZOLE SODIUM 40 MG IV SOLR: 40.0000 mg | Freq: Every day | INTRAVENOUS | Status: DC

## 2019-02-15 MED ORDER — HALOPERIDOL LACTATE 5 MG/ML IJ SOLN
5.0000 mg | Freq: Four times a day (QID) | INTRAMUSCULAR | Status: DC | PRN
  Administered 2019-02-17 – 2019-02-18 (×2): 5 mg via INTRAVENOUS
  Filled 2019-02-15 (×2): qty 1

## 2019-02-15 MED ORDER — DEXAMETHASONE SODIUM PHOSPHATE 10 MG/ML IJ SOLN
INTRAMUSCULAR | Status: DC | PRN
  Administered 2019-02-15: 10 mg via INTRAVENOUS

## 2019-02-15 SURGICAL SUPPLY — 87 items
APL SKNCLS STERI-STRIP NONHPOA (GAUZE/BANDAGES/DRESSINGS) ×4
BENZOIN TINCTURE PRP APPL 2/3 (GAUZE/BANDAGES/DRESSINGS) ×8 IMPLANT
BIT DRILL 2.4 AO COUPLING CANN (BIT) ×2 IMPLANT
BIT DRILL 3.2 (BIT) ×4
BIT DRILL 3.2XCALB NS DISP (BIT) IMPLANT
BIT DRILL CALIBRATED 2.7 (BIT) ×1 IMPLANT
BIT DRILL CALIBRATED 2.7MM (BIT) ×1
BIT DRL 3.2XCALB NS DISP (BIT) ×2
BNDG ELASTIC 4X5.8 VLCR STR LF (GAUZE/BANDAGES/DRESSINGS) ×2 IMPLANT
BNDG GAUZE ELAST 4 BULKY (GAUZE/BANDAGES/DRESSINGS) ×8 IMPLANT
BRUSH SCRUB EZ PLAIN DRY (MISCELLANEOUS) ×8 IMPLANT
COVER SURGICAL LIGHT HANDLE (MISCELLANEOUS) ×8 IMPLANT
COVER WAND RF STERILE (DRAPES) ×4 IMPLANT
DRAPE C-ARM 42X72 X-RAY (DRAPES) ×4 IMPLANT
DRAPE C-ARMOR (DRAPES) ×4 IMPLANT
DRAPE HALF SHEET 40X57 (DRAPES) ×2 IMPLANT
DRAPE IMP U-DRAPE 54X76 (DRAPES) ×4 IMPLANT
DRAPE INCISE IOBAN 66X45 STRL (DRAPES) IMPLANT
DRAPE ORTHO SPLIT 77X108 STRL (DRAPES) ×8
DRAPE SURG 17X11 SM STRL (DRAPES) ×8 IMPLANT
DRAPE SURG ORHT 6 SPLT 77X108 (DRAPES) ×4 IMPLANT
DRAPE U-SHAPE 47X51 STRL (DRAPES) ×8 IMPLANT
DRSG ADAPTIC 3X8 NADH LF (GAUZE/BANDAGES/DRESSINGS) ×4 IMPLANT
DRSG CURAD 3X16 NADH (PACKING) ×2 IMPLANT
DRSG MEPILEX BORDER 4X8 (GAUZE/BANDAGES/DRESSINGS) ×2 IMPLANT
DRSG PAD ABDOMINAL 8X10 ST (GAUZE/BANDAGES/DRESSINGS) ×4 IMPLANT
ELECT REM PT RETURN 9FT ADLT (ELECTROSURGICAL) ×4
ELECTRODE REM PT RTRN 9FT ADLT (ELECTROSURGICAL) ×2 IMPLANT
EVACUATOR 1/8 PVC DRAIN (DRAIN) IMPLANT
GAUZE SPONGE 4X4 12PLY STRL (GAUZE/BANDAGES/DRESSINGS) ×6 IMPLANT
GLOVE BIO SURGEON STRL SZ7.5 (GLOVE) ×6 IMPLANT
GLOVE BIO SURGEON STRL SZ8 (GLOVE) ×4 IMPLANT
GLOVE BIOGEL PI IND STRL 7.5 (GLOVE) ×2 IMPLANT
GLOVE BIOGEL PI IND STRL 8 (GLOVE) ×2 IMPLANT
GLOVE BIOGEL PI INDICATOR 7.5 (GLOVE) ×2
GLOVE BIOGEL PI INDICATOR 8 (GLOVE) ×2
GLOVE SURG SS PI 8.0 STRL IVOR (GLOVE) ×4 IMPLANT
GOWN STRL REUS W/ TWL XL LVL3 (GOWN DISPOSABLE) ×2 IMPLANT
GOWN STRL REUS W/TWL XL LVL3 (GOWN DISPOSABLE) ×24
K-WIRE 2X5 SS THRDED S3 (WIRE) ×8
K-WIRE TROC 1.25X150 (WIRE) ×4
KIT BASIN OR (CUSTOM PROCEDURE TRAY) ×4 IMPLANT
KIT TURNOVER KIT B (KITS) ×4 IMPLANT
KWIRE 2X5 SS THRDED S3 (WIRE) IMPLANT
KWIRE TROC 1.25X150 (WIRE) IMPLANT
LOOP VESSEL MAXI BLUE (MISCELLANEOUS) IMPLANT
MANIFOLD NEPTUNE II (INSTRUMENTS) ×4 IMPLANT
NS IRRIG 1000ML POUR BTL (IV SOLUTION) ×4 IMPLANT
PACK TOTAL JOINT (CUSTOM PROCEDURE TRAY) ×4 IMPLANT
PACK UNIVERSAL I (CUSTOM PROCEDURE TRAY) ×4 IMPLANT
PAD ARMBOARD 7.5X6 YLW CONV (MISCELLANEOUS) ×8 IMPLANT
PEG LOCKING 3.2X36 (Screw) ×2 IMPLANT
PEG LOCKING 3.2X40 (Peg) ×6 IMPLANT
PEG LOCKING 3.2X42 (Screw) ×2 IMPLANT
PEG LOCKING 3.2X50 (Screw) ×2 IMPLANT
PLATE PROX HUM HI R 4H 90 (Plate) ×2 IMPLANT
SCREW CANN PT 4X32 NS (Screw) IMPLANT
SCREW CANNULATED 4.0X32MM (Screw) ×8 IMPLANT
SCREW LOCK CORT STAR 3.5X28 (Screw) ×2 IMPLANT
SCREW LOCK CORT STAR 3.5X30 (Screw) ×2 IMPLANT
SCREW LOW PROF TIS 3.5X28MM (Screw) ×2 IMPLANT
SCREW LOW PROFILE 3.5X30MM TIS (Screw) ×2 IMPLANT
SCREW T15 LP CORT 3.5X40MM NS (Screw) ×2 IMPLANT
SPONGE LAP 18X18 RF (DISPOSABLE) IMPLANT
STAPLER VISISTAT 35W (STAPLE) ×4 IMPLANT
STOCKINETTE IMPERVIOUS 9X36 MD (GAUZE/BANDAGES/DRESSINGS) ×2 IMPLANT
STOCKINETTE IMPERVIOUS LG (DRAPES) ×2 IMPLANT
SUCTION FRAZIER HANDLE 10FR (MISCELLANEOUS) ×2
SUCTION TUBE FRAZIER 10FR DISP (MISCELLANEOUS) ×2 IMPLANT
SUT ETHIBOND 5 LR DA (SUTURE) ×4 IMPLANT
SUT ETHILON 2 0 PSLX (SUTURE) ×2 IMPLANT
SUT FIBERWIRE #2 38 T-5 BLUE (SUTURE)
SUT PDS AB 2-0 CT1 27 (SUTURE) IMPLANT
SUT VIC AB 0 CT1 27 (SUTURE) ×12
SUT VIC AB 0 CT1 27XBRD ANBCTR (SUTURE) ×4 IMPLANT
SUT VIC AB 2-0 CT1 (SUTURE) ×2 IMPLANT
SUT VIC AB 2-0 CT1 27 (SUTURE) ×8
SUT VIC AB 2-0 CT1 TAPERPNT 27 (SUTURE) ×4 IMPLANT
SUT VIC AB 2-0 CT3 27 (SUTURE) ×2 IMPLANT
SUT VIC AB 2-0 FS1 27 (SUTURE) ×2 IMPLANT
SUTURE FIBERWR #2 38 T-5 BLUE (SUTURE) IMPLANT
SYR 5ML LL (SYRINGE) IMPLANT
TOWEL GREEN STERILE (TOWEL DISPOSABLE) ×8 IMPLANT
TOWEL GREEN STERILE FF (TOWEL DISPOSABLE) ×4 IMPLANT
TRAY FOLEY MTR SLVR 16FR STAT (SET/KITS/TRAYS/PACK) IMPLANT
WATER STERILE IRR 1000ML POUR (IV SOLUTION) ×4 IMPLANT
YANKAUER SUCT BULB TIP NO VENT (SUCTIONS) IMPLANT

## 2019-02-15 NOTE — Progress Notes (Addendum)
SLP Cancellation Note  Patient Details Name: SHERWIN HOLLINGSHED MRN: [id number] DOB: November 23, 1960   Cancelled treatment:       Reason Eval/Treat Not Completed: Patient at procedure or test/unavailable; Orders received and acknowledged for SLE.  Pt is currently OTF for surgery.  SLP will f/u as appropriate and as schedule allows.     Elvia Collum Zander Ingham 03-07-19, 9:25 AM

## 2019-02-15 NOTE — Progress Notes (Signed)
Orthopedic Tech Progress Note Patient Details:  Austin Mendez 01-Aug-1960 [id number]  Ortho Devices Type of Ortho Device: CAM walker, Shoulder immobilizer Ortho Device/Splint Location: right Ortho Device/Splint Interventions: Application   Post Interventions Patient Tolerated: Well Instructions Provided: Care of device   [name] 02/08/2019, 6:53 PM

## 2019-02-15 NOTE — Anesthesia Preprocedure Evaluation (Addendum)
Anesthesia Evaluation  Patient identified by MRN, date of birth, ID band Patient awake    Reviewed: Allergy & Precautions, H&P , NPO status , Patient's Chart, lab work & pertinent test results, reviewed documented beta blocker date and time   Airway Mallampati: II  TM Distance: >3 FB Neck ROM: full    Dental no notable dental hx.    Pulmonary neg pulmonary ROS, Current Smoker,    Pulmonary exam normal breath sounds clear to auscultation       Cardiovascular Exercise Tolerance: Good hypertension,  Rhythm:regular Rate:Normal     Neuro/Psych negative neurological ROS  negative psych ROS   GI/Hepatic negative GI ROS, Neg liver ROS,   Endo/Other  negative endocrine ROS  Renal/GU negative Renal ROS  negative genitourinary   Musculoskeletal   Abdominal   Peds  Hematology  (+) Blood dyscrasia, anemia ,   Anesthesia Other Findings   Reproductive/Obstetrics negative OB ROS                             Anesthesia Physical Anesthesia Plan  ASA: III  Anesthesia Plan: General   Post-op Pain Management:    Induction:   PONV Risk Score and Plan: 2 and Ondansetron, Dexamethasone and Treatment may vary due to age or medical condition  Airway Management Planned: Oral ETT and LMA  Additional Equipment:   Intra-op Plan:   Post-operative Plan: Extubation in OR  Informed Consent: I have reviewed the patients History and Physical, chart, labs and discussed the procedure including the risks, benefits and alternatives for the proposed anesthesia with the patient or authorized representative who has indicated his/her understanding and acceptance.     Dental Advisory Given  Plan Discussed with: CRNA, Anesthesiologist and Surgeon  Anesthesia Plan Comments: (Discussed both nerve block for pain relief post-op and GA; including NV, sore throat, dental injury, and pulmonary complications)        Anesthesia Quick Evaluation

## 2019-02-15 NOTE — Anesthesia Procedure Notes (Signed)
Procedure Name: Intubation Date/Time: 02/10/2019 12:12 PM Performed by: Mariea Clonts, CRNA Pre-anesthesia Checklist: Patient identified, Emergency Drugs available, Suction available and Patient being monitored Patient Re-evaluated:Patient Re-evaluated prior to induction Oxygen Delivery Method: Circle System Utilized Preoxygenation: Pre-oxygenation with 100% oxygen Induction Type: IV induction, Cricoid Pressure applied and Rapid sequence Laryngoscope Size: Mac and 4 Grade View: Grade II Tube type: Oral Tube size: 7.5 mm Number of attempts: 1 Airway Equipment and Method: Stylet and Oral airway Placement Confirmation: ETT inserted through vocal cords under direct vision,  positive ETCO2 and breath sounds checked- equal and bilateral Tube secured with: Tape Dental Injury: Teeth and Oropharynx as per pre-operative assessment

## 2019-02-15 NOTE — Progress Notes (Signed)
PT Cancellation Note  Patient Details Name: Austin Mendez MRN: [id number] DOB: 27-Aug-1960   Cancelled Treatment:    Reason Eval/Treat Not Completed: Patient not medically ready;Patient at procedure or test/unavailable.  Pt has not yet returned for surgery today. 02/19/2019  Donnella Sham, [place] [phone]  (pager) [phone]  (office)   Tessie Fass Tracy Kinner 02/17/2019, 3:14 PM

## 2019-02-15 NOTE — OR Nursing (Signed)
Tiffany RN left room at 1218.

## 2019-02-15 NOTE — Anesthesia Postprocedure Evaluation (Signed)
Anesthesia Post Note  Patient: Austin Mendez  Procedure(s) Performed: OPEN REDUCTION INTERNAL FIXATION (ORIF) PROXIMAL HUMERUS FRACTURE (Right Shoulder) Open Reduction Internal Fixation (Orif) Tibia Fracture (Right Ankle)     Patient location during evaluation: PACU Anesthesia Type: General Level of consciousness: awake and alert Pain management: pain level controlled Vital Signs Assessment: post-procedure vital signs reviewed and stable Respiratory status: spontaneous breathing, nonlabored ventilation, respiratory function stable and patient connected to nasal cannula oxygen Cardiovascular status: blood pressure returned to baseline and stable Postop Assessment: no apparent nausea or vomiting Anesthetic complications: no    Last Vitals:  Vitals:   02/09/2019 1602 02/10/2019 1640  BP:    Pulse:    Resp:    Temp: 37.1 C 36.9 C  SpO2:      Last Pain:  Vitals:   02/05/2019 1640  TempSrc: Oral  PainSc:                  [name]
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
## 2019-02-15 NOTE — Progress Notes (Signed)
  I have personally reviewed and concur with evaluation and plan for management by Dr. Carter Kitten, and specifically have confirmed the patient's presentation and examination findings; I have reviewed and interpreted the x-rays and laboratory studies; and plan for ORIF of the right proximal humerus.  I also discussed with the patient the risks and benefits of surgery, including the possibility of infection, nerve injury, vessel injury, wound breakdown, arthritis, symptomatic hardware, DVT/ PE, loss of motion, malunion, nonunion, and need for further surgery among others. He acknowledged these risks and wished to proceed.   Austin Piedmont, MD [facility] [phone] (p)
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
## 2019-02-16 ENCOUNTER — Encounter (HOSPITAL_COMMUNITY): Payer: Self-pay | Admitting: Orthopedic Surgery

## 2019-02-16 DIAGNOSIS — S8251XA Displaced fracture of medial malleolus of right tibia, initial encounter for closed fracture: Secondary | ICD-10-CM | POA: Diagnosis present

## 2019-02-16 DIAGNOSIS — S82831A Other fracture of upper and lower end of right fibula, initial encounter for closed fracture: Secondary | ICD-10-CM | POA: Diagnosis present

## 2019-02-16 HISTORY — DX: Other fracture of upper and lower end of right fibula, initial encounter for closed fracture: S82.831A

## 2019-02-16 HISTORY — DX: Displaced fracture of medial malleolus of right tibia, initial encounter for closed fracture: S82.51XA

## 2019-02-16 LAB — CBC
HCT: 23.8 % — ABNORMAL LOW (ref 39.0–52.0)
HCT: 26.7 % — ABNORMAL LOW (ref 39.0–52.0)
Hemoglobin: 7.9 g/dL — ABNORMAL LOW (ref 13.0–17.0)
Hemoglobin: 9 g/dL — ABNORMAL LOW (ref 13.0–17.0)
MCH: 28.9 pg (ref 26.0–34.0)
MCH: 29 pg (ref 26.0–34.0)
MCHC: 33.2 g/dL (ref 30.0–36.0)
MCHC: 33.7 g/dL (ref 30.0–36.0)
MCV: 87.2 fL (ref 80.0–100.0)
MCV: 86.1 fL (ref 80.0–100.0)
Platelets: 184 10*3/uL (ref 150–400)
Platelets: 210 10*3/uL (ref 150–400)
RBC: 2.73 MIL/uL — ABNORMAL LOW (ref 4.22–5.81)
RBC: 3.1 MIL/uL — ABNORMAL LOW (ref 4.22–5.81)
RDW: 17.2 % — ABNORMAL HIGH (ref 11.5–15.5)
RDW: 17.4 % — ABNORMAL HIGH (ref 11.5–15.5)
WBC: 9.3 10*3/uL (ref 4.0–10.5)
WBC: 15.6 10*3/uL — ABNORMAL HIGH (ref 4.0–10.5)
nRBC: 0.2 % (ref 0.0–0.2)
nRBC: 0 % (ref 0.0–0.2)

## 2019-02-16 LAB — BASIC METABOLIC PANEL
Anion gap: 9 (ref 5–15)
BUN: 15 mg/dL (ref 6–20)
CO2: 23 mmol/L (ref 22–32)
Calcium: 7.3 mg/dL — ABNORMAL LOW (ref 8.9–10.3)
Chloride: 103 mmol/L (ref 98–111)
Creatinine, Ser: 1.41 mg/dL — ABNORMAL HIGH (ref 0.61–1.24)
GFR calc Af Amer: >60 mL/min (ref >60)
GFR calc non Af Amer: 55 mL/min — ABNORMAL LOW (ref >60)
Glucose, Bld: 111 mg/dL — ABNORMAL HIGH (ref 70–99)
Potassium: 4 mmol/L (ref 3.5–5.1)
Sodium: 135 mmol/L (ref 135–145)

## 2019-02-16 LAB — MAGNESIUM: Magnesium: 1.2 mg/dL — ABNORMAL LOW (ref 1.7–2.4)

## 2019-02-16 LAB — PHOSPHORUS: Phosphorus: 3.8 mg/dL (ref 2.5–4.6)

## 2019-02-16 MED ORDER — MENTHOL 3 MG MT LOZG: 1.0000 | LOZENGE | OROMUCOSAL | Status: DC | PRN

## 2019-02-16 MED ORDER — CHLORHEXIDINE GLUCONATE 0.12 % MT SOLN
15.0000 mL | Freq: Two times a day (BID) | OROMUCOSAL | Status: DC
  Administered 2019-02-16 – 2019-02-18 (×4): 15 mL via OROMUCOSAL
  Filled 2019-02-16 (×2): qty 15

## 2019-02-16 MED ORDER — ORAL CARE MOUTH RINSE
15.0000 mL | Freq: Two times a day (BID) | OROMUCOSAL | Status: DC
  Administered 2019-02-18 (×2): 15 mL via OROMUCOSAL

## 2019-02-16 MED ORDER — SODIUM CHLORIDE 0.9 % IV BOLUS
500.0000 mL | Freq: Once | INTRAVENOUS | Status: AC
  Administered 2019-02-16: 500 mL via INTRAVENOUS

## 2019-02-16 MED ORDER — PHENOL 1.4 % MT LIQD: 1.0000 | OROMUCOSAL | Status: DC | PRN

## 2019-02-16 MED ORDER — METOPROLOL TARTRATE 5 MG/5ML IV SOLN
5.0000 mg | Freq: Four times a day (QID) | INTRAVENOUS | Status: AC | PRN
  Administered 2019-02-17 (×4): 5 mg via INTRAVENOUS
  Filled 2019-02-16 (×4): qty 5

## 2019-02-16 NOTE — Progress Notes (Signed)
Rehab Admissions Coordinator Note:  Per PT recommendation, patient was screened by Michel Santee for appropriateness for an Inpatient Acute Rehab Consult.  At this time, we are recommending Inpatient Rehab consult.  I will page Trauma PA for an order.   Michel Santee 02/16/2019, 11:39 AM  I can be reached at [phone].
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
## 2019-02-16 NOTE — Progress Notes (Signed)
Inpatient Rehab Admissions:  Inpatient Rehab Consult received.  I met with patient at the bedside for rehabilitation assessment and to discuss goals and expectations of an inpatient rehab admission.  He was in pain, and said he couldn't think about rehab right now.  Asked that I follow up with his wife.  I left her a message to call me back.  Will follow up on Friday.   Signed: Shann Medal, PT, DPT Admissions Coordinator [phone] 02/16/19  5:17 PM

## 2019-02-16 NOTE — Progress Notes (Signed)
SLP Cancellation Note  Patient Details Name: Austin Mendez MRN: [id number] DOB: 1960/07/20   Cancelled treatment:        Attempted SLE. Pt is in significant pain- moaning and unable to participate presently. RN present to give pain meds. Possible transfer to [facility] Fri.   Houston Siren 02/16/2019, 2:47 PM  Orbie Pyo Colvin Caroli.Ed Risk analyst [phone] Office [phone]
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
## 2019-02-17 LAB — CBC
HCT: 24.7 % — ABNORMAL LOW (ref 39.0–52.0)
Hemoglobin: 8.2 g/dL — ABNORMAL LOW (ref 13.0–17.0)
MCH: 29.1 pg (ref 26.0–34.0)
MCHC: 33.2 g/dL (ref 30.0–36.0)
MCV: 87.6 fL (ref 80.0–100.0)
Platelets: 211 10*3/uL (ref 150–400)
RBC: 2.82 MIL/uL — ABNORMAL LOW (ref 4.22–5.81)
RDW: 17.7 % — ABNORMAL HIGH (ref 11.5–15.5)
WBC: 9.8 10*3/uL (ref 4.0–10.5)
nRBC: 0 % (ref 0.0–0.2)

## 2019-02-17 LAB — VITAMIN D 25 HYDROXY (VIT D DEFICIENCY, FRACTURES): Vit D, 25-Hydroxy: 15.08 ng/mL — ABNORMAL LOW (ref 30–100)

## 2019-02-17 LAB — BASIC METABOLIC PANEL
Anion gap: 9 (ref 5–15)
BUN: 18 mg/dL (ref 6–20)
CO2: 22 mmol/L (ref 22–32)
Calcium: 7.7 mg/dL — ABNORMAL LOW (ref 8.9–10.3)
Chloride: 103 mmol/L (ref 98–111)
Creatinine, Ser: 1.28 mg/dL — ABNORMAL HIGH (ref 0.61–1.24)
GFR calc Af Amer: >60 mL/min (ref >60)
GFR calc non Af Amer: >60 mL/min (ref >60)
Glucose, Bld: 85 mg/dL (ref 70–99)
Potassium: 4.2 mmol/L (ref 3.5–5.1)
Sodium: 134 mmol/L — ABNORMAL LOW (ref 135–145)

## 2019-02-17 MED ORDER — ALBUTEROL SULFATE (2.5 MG/3ML) 0.083% IN NEBU
INHALATION_SOLUTION | RESPIRATORY_TRACT | Status: AC
  Filled 2019-02-17: qty 3

## 2019-02-17 MED ORDER — ALBUTEROL SULFATE (2.5 MG/3ML) 0.083% IN NEBU
2.5000 mg | INHALATION_SOLUTION | Freq: Once | RESPIRATORY_TRACT | Status: AC | PRN
  Administered 2019-02-18: 2.5 mg via RESPIRATORY_TRACT
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
## 2019-02-17 NOTE — Significant Event (Addendum)
Rapid Response Event Note  Overview: Called d/t SOB, HR-140s, RR-27, lungs wheezy Time Called: 2216 Arrival Time: 2224 Event Type: Respiratory, Cardiac  Initial Focused Assessment: On my arrival, pt was asleep in bed, snoring/upper airway rhonchi. HR-106(ST), BP-180/89, RR-19, SpO2-97% on 2L [place]. Rhonchi heard in upper lobes but sounds like its coming from upper airway. Pt not woken up at this time d/t h/o combativeness when awake. Asked RN to call when he is awake/appears to be in distress again.  2346-RN called because pt awake again, tachycardic, and SOB. On my arrival, pt awake, attempting to get OOB, will not follow commands(baseline per RN). Pt in respiratory distress with pursed lip breathing and audible wheezes. HR-130-150s, RR-30s, SpO2-97% on 2L [place]. Albuterol tx ordered and given. Near the end up the breathing tx, pt began to drop SpO2-60s. Pt placed on NRB with SpO2 increasing to 95%. Crackles now heard in lungs  Bilaterally and frothy sputum coming from pts mouth. PCXR and ABG ordered. Dr. Georgette Dover notified of situation and ordered 20mg  IV lasix  Interventions: Albuterol tx NRB PCXR ABG 20mg  lasix IV  Plan of Care (if not transferred): Closely monitor pts respiratory status and response to lasix. Attempt to wean off NRB mask when SpO2/respiratory status allow. Notify MD of PCXR/ABG results, if abnormal. Call RRT if further assistance needed.  0121-wheezing again. Xopenex tx given. ABG drawn was a VENOUS sample! PO2-39-This isn't a true PO2.  0150- Pt appears more comfortable, resting, no wheezes SpO2-100% on NRB, RR-30 Event Summary: Name of Physician Notified: Ts[place]i at 2315    at          [place], Carren Rang

## 2019-02-18 ENCOUNTER — Inpatient Hospital Stay (HOSPITAL_COMMUNITY): Payer: Medicaid Other

## 2019-02-18 ENCOUNTER — Inpatient Hospital Stay (HOSPITAL_COMMUNITY): Payer: Medicaid Other | Admitting: Certified Registered Nurse Anesthetist

## 2019-02-18 DIAGNOSIS — M7989 Other specified soft tissue disorders: Secondary | ICD-10-CM

## 2019-02-18 LAB — POCT I-STAT 7, (LYTES, BLD GAS, ICA,H+H)
Acid-Base Excess: 2 mmol/L (ref 0.0–2.0)
Acid-base deficit: 3 mmol/L — ABNORMAL HIGH (ref 0.0–2.0)
Bicarbonate: 20.2 mmol/L (ref 20.0–28.0)
Bicarbonate: 26.1 mmol/L (ref 20.0–28.0)
Calcium, Ion: 1.08 mmol/L — ABNORMAL LOW (ref 1.15–1.40)
Calcium, Ion: 1.06 mmol/L — ABNORMAL LOW (ref 1.15–1.40)
HCT: 26 % — ABNORMAL LOW (ref 39.0–52.0)
HCT: 21 % — ABNORMAL LOW (ref 39.0–52.0)
Hemoglobin: 8.8 g/dL — ABNORMAL LOW (ref 13.0–17.0)
Hemoglobin: 7.1 g/dL — ABNORMAL LOW (ref 13.0–17.0)
O2 Saturation: 76 %
O2 Saturation: 100 %
Patient temperature: 99
Potassium: 4.2 mmol/L (ref 3.5–5.1)
Potassium: 3.5 mmol/L (ref 3.5–5.1)
Sodium: 137 mmol/L (ref 135–145)
Sodium: 142 mmol/L (ref 135–145)
TCO2: 21 mmol/L — ABNORMAL LOW (ref 22–32)
TCO2: 27 mmol/L (ref 22–32)
pCO2 arterial: 29.9 mmHg — ABNORMAL LOW (ref 32.0–48.0)
pCO2 arterial: 37.3 mmHg (ref 32.0–48.0)
pH, Arterial: 7.438 (ref 7.350–7.450)
pH, Arterial: 7.454 — ABNORMAL HIGH (ref 7.350–7.450)
pO2, Arterial: 39 mmHg — CL (ref 83.0–108.0)
pO2, Arterial: 309 mmHg — ABNORMAL HIGH (ref 83.0–108.0)

## 2019-02-18 LAB — BASIC METABOLIC PANEL
Anion gap: 13 (ref 5–15)
BUN: 18 mg/dL (ref 6–20)
CO2: 22 mmol/L (ref 22–32)
Calcium: 8.1 mg/dL — ABNORMAL LOW (ref 8.9–10.3)
Chloride: 105 mmol/L (ref 98–111)
Creatinine, Ser: 1.29 mg/dL — ABNORMAL HIGH (ref 0.61–1.24)
GFR calc Af Amer: >60 mL/min (ref >60)
GFR calc non Af Amer: >60 mL/min (ref >60)
Glucose, Bld: 80 mg/dL (ref 70–99)
Potassium: 3.8 mmol/L (ref 3.5–5.1)
Sodium: 140 mmol/L (ref 135–145)

## 2019-02-18 LAB — CBC
HCT: 25.5 % — ABNORMAL LOW (ref 39.0–52.0)
Hemoglobin: 8.3 g/dL — ABNORMAL LOW (ref 13.0–17.0)
MCH: 28.6 pg (ref 26.0–34.0)
MCHC: 32.5 g/dL (ref 30.0–36.0)
MCV: 87.9 fL (ref 80.0–100.0)
Platelets: 267 10*3/uL (ref 150–400)
RBC: 2.9 MIL/uL — ABNORMAL LOW (ref 4.22–5.81)
RDW: 17.5 % — ABNORMAL HIGH (ref 11.5–15.5)
WBC: 9.9 10*3/uL (ref 4.0–10.5)
nRBC: 0.2 % (ref 0.0–0.2)

## 2019-02-18 LAB — GLUCOSE, CAPILLARY
Glucose-Capillary: 71 mg/dL (ref 70–99)
Glucose-Capillary: 74 mg/dL (ref 70–99)

## 2019-02-18 MED ORDER — PROPOFOL 1000 MG/100ML IV EMUL
0.0000 ug/kg/min | INTRAVENOUS | Status: DC
  Administered 2019-02-18: 25 ug/kg/min via INTRAVENOUS
  Administered 2019-02-18: 30 ug/kg/min via INTRAVENOUS
  Administered 2019-02-19: 30.005 ug/kg/min via INTRAVENOUS
  Administered 2019-02-19: 30 ug/kg/min via INTRAVENOUS
  Administered 2019-02-19: 30.005 ug/kg/min via INTRAVENOUS
  Administered 2019-02-20: 40 ug/kg/min via INTRAVENOUS
  Administered 2019-02-20 (×2): 30.005 ug/kg/min via INTRAVENOUS
  Administered 2019-02-21 (×2): 30 ug/kg/min via INTRAVENOUS
  Administered 2019-02-22 (×4): 50 ug/kg/min via INTRAVENOUS
  Administered 2019-02-22: 30 ug/kg/min via INTRAVENOUS
  Administered 2019-02-23 (×2): 40 ug/kg/min via INTRAVENOUS
  Filled 2019-02-18 (×21): qty 100

## 2019-02-18 MED ORDER — FUROSEMIDE 10 MG/ML IJ SOLN
INTRAMUSCULAR | Status: AC
  Filled 2019-02-18: qty 2

## 2019-02-18 MED ORDER — FUROSEMIDE 10 MG/ML IJ SOLN
20.0000 mg | Freq: Once | INTRAMUSCULAR | Status: AC
  Administered 2019-02-18: 20 mg via INTRAVENOUS
  Filled 2019-02-18: qty 2

## 2019-02-18 MED ORDER — CHLORHEXIDINE GLUCONATE CLOTH 2 % EX PADS
6.0000 | MEDICATED_PAD | Freq: Every day | CUTANEOUS | Status: DC
  Administered 2019-02-18 – 2019-02-22 (×5): 6 via TOPICAL

## 2019-02-18 MED ORDER — LIDOCAINE HCL (CARDIAC) PF 100 MG/5ML IV SOSY
PREFILLED_SYRINGE | INTRAVENOUS | Status: DC | PRN
  Administered 2019-02-18: 50 mg via INTRAVENOUS

## 2019-02-18 MED ORDER — PROPOFOL 10 MG/ML IV BOLUS
INTRAVENOUS | Status: DC | PRN
  Administered 2019-02-18: 90 mg via INTRAVENOUS

## 2019-02-18 MED ORDER — LEVALBUTEROL HCL 0.63 MG/3ML IN NEBU: 0.6300 mg | INHALATION_SOLUTION | Freq: Four times a day (QID) | RESPIRATORY_TRACT | Status: DC | PRN

## 2019-02-18 MED ORDER — METOPROLOL TARTRATE 5 MG/5ML IV SOLN
5.0000 mg | Freq: Four times a day (QID) | INTRAVENOUS | Status: AC | PRN
  Administered 2019-02-19 – 2019-02-21 (×4): 5 mg via INTRAVENOUS
  Filled 2019-02-18 (×4): qty 5

## 2019-02-18 MED ORDER — LEVALBUTEROL HCL 0.63 MG/3ML IN NEBU
INHALATION_SOLUTION | RESPIRATORY_TRACT | Status: AC
  Administered 2019-02-18: 01:00:00 1.26 mg
  Filled 2019-02-18: qty 6

## 2019-02-18 MED ORDER — ORAL CARE MOUTH RINSE
15.0000 mL | OROMUCOSAL | Status: DC
  Administered 2019-02-18 – 2019-02-27 (×90): 15 mL via OROMUCOSAL

## 2019-02-18 MED ORDER — FUROSEMIDE 10 MG/ML IJ SOLN
20.0000 mg | Freq: Once | INTRAMUSCULAR | Status: AC
  Administered 2019-02-18: 01:00:00 20 mg via INTRAVENOUS

## 2019-02-18 MED ORDER — CHLORHEXIDINE GLUCONATE 0.12% ORAL RINSE (MEDLINE KIT)
15.0000 mL | Freq: Two times a day (BID) | OROMUCOSAL | Status: DC
  Administered 2019-02-18 – 2019-02-27 (×18): 15 mL via OROMUCOSAL

## 2019-02-18 MED ORDER — FENTANYL CITRATE (PF) 100 MCG/2ML IJ SOLN
50.0000 ug | INTRAMUSCULAR | Status: DC | PRN
  Administered 2019-02-19 (×4): 100 ug via INTRAVENOUS
  Administered 2019-02-20: 18:00:00 200 ug via INTRAVENOUS
  Administered 2019-02-20 (×4): 100 ug via INTRAVENOUS
  Administered 2019-02-20: 200 ug via INTRAVENOUS
  Administered 2019-02-21: 100 ug via INTRAVENOUS
  Administered 2019-02-21 (×2): 200 ug via INTRAVENOUS
  Administered 2019-02-21 – 2019-02-22 (×3): 100 ug via INTRAVENOUS
  Filled 2019-02-18: qty 4
  Filled 2019-02-18: qty 2
  Filled 2019-02-18: qty 4
  Filled 2019-02-18 (×6): qty 2
  Filled 2019-02-18: qty 4
  Filled 2019-02-18 (×6): qty 2
  Filled 2019-02-18: qty 4

## 2019-02-18 MED ORDER — FENTANYL CITRATE (PF) 100 MCG/2ML IJ SOLN
50.0000 ug | INTRAMUSCULAR | Status: AC | PRN
  Administered 2019-02-18 – 2019-02-19 (×3): 50 ug via INTRAVENOUS
  Filled 2019-02-18: qty 2

## 2019-02-18 MED ORDER — SUCCINYLCHOLINE CHLORIDE 20 MG/ML IJ SOLN
INTRAMUSCULAR | Status: DC | PRN
  Administered 2019-02-18: 140 mg via INTRAVENOUS

## 2019-02-18 MED ORDER — FENTANYL CITRATE (PF) 100 MCG/2ML IJ SOLN
INTRAMUSCULAR | Status: AC
  Administered 2019-02-18: 50 ug via INTRAVENOUS
  Filled 2019-02-18: qty 2
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
## 2019-02-18 NOTE — Progress Notes (Signed)
Pt intubated by CRNA in [phone]. Pt placed on full vent support & transported to 4N20. RT will continue to monitor.

## 2019-02-18 NOTE — Progress Notes (Signed)
2216: pt HR in 140's and demonstrating increased work of breathing on 2L [place]. RRT notified.   2224: RRT at bedside. Pt asleep with stable  VS. RRT asked RN to notify if when pt appears in distress again.   2346: pt awake, combative, tachycardic, and demonstrating increased work of breathing, including accessory muscle use, pursed lip breathing and audible wheezes. Albuterol treatment ordered and completed. Pt continued to have increased work of breathing and O2 sat dropped into 60's. Pt placed on NRB with O2sat improving to 95%. Crackles heard in lungs and on-call MD notified. Order for lasix received. Additional orders for CXR and ABG received.   0121: pt continues to experience SOB and wheezing, additional breathing treatment performed by RT.   0150: pt resting with O2sat 100% on NRB.   Pt transitioned to 8L on venturi mask and maintaining. Will continue to monitor.  Gailen Shelter RN

## 2019-02-18 NOTE — Progress Notes (Signed)
SLP Cancellation Note  Patient Details Name: Austin Mendez MRN: [id number] DOB: 10-Jan-1961   Cancelled treatment:       Reason Eval/Treat Not Completed: Medical issues which prohibited therapy   Pt with respiratory complications, currently intubated. ST will follow for appropriateness.    Nellie Pester 02/18/2019, 11:45 AM
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
## 2019-02-18 NOTE — Anesthesia Postprocedure Evaluation (Signed)
Anesthesia Post Note  Patient: Austin Mendez  Procedure(s) Performed: AN AD [place]     Patient location during evaluation: ICU Level of consciousness: patient remains intubated per anesthesia plan Vital Signs Assessment: post-procedure vital signs reviewed and stable Respiratory status: patient remains intubated per anesthesia plan and patient on ventilator - see flowsheet for VS Cardiovascular status: stable Anesthetic complications: no    Last Vitals:  Vitals:   02/18/19 0359 02/18/19 0819  BP:  (!) 154/108  Pulse:  (!) 125  Resp:  (!) 24  Temp: 37 C 37.7 C  SpO2:  98%    Last Pain:  Vitals:   02/18/19 0819  TempSrc: Axillary  PainSc:                  Austin Mendez

## 2019-02-18 NOTE — Progress Notes (Signed)
Anesthesia at bedside intubating patient.  Assisted with transport to 4N20.  ICU staff at bedside upon arrival.

## 2019-02-18 NOTE — Transfer of Care (Signed)
Immediate Anesthesia Transfer of Care Note  Patient: Austin Mendez  Procedure(s) Performed: AN AD [place]  Patient Location: ICU  Anesthesia Type:Induction for Intubation per request of 1* team  Level of Consciousness: Patient remains intubated per anesthesia plan  Airway & Oxygen Therapy: Patient placed on Ventilator (see vital sign flow sheet for setting)  Post-op Assessment: Report given to RN  Post vital signs: Reviewed and stable  Last Vitals:  Vitals Value Taken Time  BP 101/32 02/18/19 1022  Temp    Pulse 120 02/18/19 1032  Resp 19 02/18/19 1032  SpO2 100 % 02/18/19 1032  Vitals shown include unvalidated device data.  Last Pain:  Vitals:   02/18/19 0819  TempSrc: Axillary  PainSc:          Complications: No apparent anesthesia complications

## 2019-02-18 NOTE — Progress Notes (Signed)
Right upper ext venous duplex  has been completed. Refer to [facility] under chart review to view preliminary results.   02/18/2019  1:07 PM Kaushal Vannice, Bonnye Fava

## 2019-02-18 NOTE — Progress Notes (Signed)
Patient in respiratory distress with increased accessory muscle usage,audible wheezes, prn order of Xopenex added to [facility] due to increased HR of 140's.ABG redrawn due to clotted specimen and ran on I-stat.

## 2019-02-18 NOTE — Progress Notes (Signed)
PT Cancellation Note  Patient Details Name: Austin Mendez MRN: [id number] DOB: [birth year]   Cancelled Treatment:    Reason Eval/Treat Not Completed: Patient not medically ready.  Pt with respiratory complications, transferring to neuro ICU. 02/18/2019  Donnella Sham, PT Acute Rehabilitation Services [phone]  (pager) [phone]  (office)   Tessie Fass Lizandra Zakrzewski 02/18/2019, 11:24 AM

## 2019-02-18 NOTE — Evaluation (Signed)
SLP Cancellation Note  Patient Details Name: Austin Mendez MRN: [id number] DOB: [birth year]   Cancelled treatment:       Reason Eval/Treat Not Completed: Other (comment)(pt remains on vent, will continue for appropriateness)   Macario Golds 02/18/2019, 6:17 PM   Luanna Salk, [place] [facility] SLP Acute Rehab Services Pager [phone] Office [phone]

## 2019-02-18 NOTE — Progress Notes (Addendum)
Received call from patient's nurse that he was having difficulty breathing.  Patient currently on a Ventimask at 8 L.  She reports that his O2 is dipped into the 80s several times over the last hour.  She reports that he is more agitated despite Haldol and has tried pulling off his mask several times.  He has become tachycardic in the 140s. ABG this am with P02 of 39. Seen and examined.   Patient with accessory muscle usage, audible wheezing, and tachypnea.  Discussed with Dr. Brantley Stage.  I have called anesthesia for intubation.  Propofol for sedation.  Will transfer back to ICU after intubation. Attempted to call family, went straight to voicemail.   Addendum: Patient intubated by anesthesia and being transferred to the unit. Attempted to call family, went straight to voicemail.
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
## 2019-02-18 NOTE — Progress Notes (Signed)
CSW acknowledges consult for substance abuse. Patient is currently intubated. CSW will continue to follow for anticipated needs and intervene as appropriate.

## 2019-02-18 NOTE — Plan of Care (Signed)
  Problem: Clinical Measurements: Goal: Ability to maintain clinical measurements within normal limits will improve Outcome: Progressing   

## 2019-02-18 NOTE — Progress Notes (Signed)
ABG collected at 0040 and sent down to lab at 0049. Called lab to inform them that its a stat ABG at 0051.

## 2019-02-19 ENCOUNTER — Inpatient Hospital Stay (HOSPITAL_COMMUNITY): Payer: Medicaid Other

## 2019-02-19 LAB — COMPREHENSIVE METABOLIC PANEL
ALT: 43 U/L (ref 0–44)
AST: 99 U/L — ABNORMAL HIGH (ref 15–41)
Albumin: 2.2 g/dL — ABNORMAL LOW (ref 3.5–5.0)
Alkaline Phosphatase: 48 U/L (ref 38–126)
Anion gap: 13 (ref 5–15)
BUN: 26 mg/dL — ABNORMAL HIGH (ref 6–20)
CO2: 21 mmol/L — ABNORMAL LOW (ref 22–32)
Calcium: 8.2 mg/dL — ABNORMAL LOW (ref 8.9–10.3)
Chloride: 109 mmol/L (ref 98–111)
Creatinine, Ser: 1.46 mg/dL — ABNORMAL HIGH (ref 0.61–1.24)
GFR calc Af Amer: >60 mL/min (ref >60)
GFR calc non Af Amer: 52 mL/min — ABNORMAL LOW (ref >60)
Glucose, Bld: 71 mg/dL (ref 70–99)
Potassium: 4.1 mmol/L (ref 3.5–5.1)
Sodium: 143 mmol/L (ref 135–145)
Total Bilirubin: 2.5 mg/dL — ABNORMAL HIGH (ref 0.3–1.2)
Total Protein: 6 g/dL — ABNORMAL LOW (ref 6.5–8.1)

## 2019-02-19 LAB — CBC
HCT: 25.8 % — ABNORMAL LOW (ref 39.0–52.0)
Hemoglobin: 8.5 g/dL — ABNORMAL LOW (ref 13.0–17.0)
MCH: 29.4 pg (ref 26.0–34.0)
MCHC: 32.9 g/dL (ref 30.0–36.0)
MCV: 89.3 fL (ref 80.0–100.0)
Platelets: 233 10*3/uL (ref 150–400)
RBC: 2.89 MIL/uL — ABNORMAL LOW (ref 4.22–5.81)
RDW: 18.2 % — ABNORMAL HIGH (ref 11.5–15.5)
WBC: 5.4 10*3/uL (ref 4.0–10.5)
nRBC: 0 % (ref 0.0–0.2)

## 2019-02-19 LAB — GLUCOSE, CAPILLARY
Glucose-Capillary: 71 mg/dL (ref 70–99)
Glucose-Capillary: 63 mg/dL — ABNORMAL LOW (ref 70–99)

## 2019-02-19 LAB — TRIGLYCERIDES: Triglycerides: 116 mg/dL (ref <150)

## 2019-02-19 MED ORDER — DEXTROSE 50 % IV SOLN
25.0000 mL | Freq: Once | INTRAVENOUS | Status: AC
  Administered 2019-02-19: 25 mL via INTRAVENOUS

## 2019-02-19 MED ORDER — OXYCODONE HCL 5 MG PO TABS
10.0000 mg | ORAL_TABLET | ORAL | Status: DC | PRN
  Administered 2019-02-23: 10 mg
  Filled 2019-02-19: qty 2

## 2019-02-19 MED ORDER — PIVOT 1.5 CAL PO LIQD
1000.0000 mL | ORAL | Status: DC
  Administered 2019-02-20 – 2019-02-23 (×4): 1000 mL
  Filled 2019-02-19 (×6): qty 1000

## 2019-02-19 MED ORDER — OXYCODONE HCL 5 MG PO TABS: 5.0000 mg | ORAL_TABLET | ORAL | Status: DC | PRN

## 2019-02-19 MED ORDER — DEXTROSE 50 % IV SOLN
INTRAVENOUS | Status: AC
  Filled 2019-02-19: qty 50

## 2019-02-19 MED ORDER — ADULT MULTIVITAMIN W/MINERALS CH
1.0000 | ORAL_TABLET | Freq: Every day | ORAL | Status: DC
  Administered 2019-02-20 – 2019-02-27 (×8): 1
  Filled 2019-02-19 (×8): qty 1

## 2019-02-19 MED ORDER — AMLODIPINE BESYLATE 10 MG PO TABS
10.0000 mg | ORAL_TABLET | Freq: Every day | ORAL | Status: DC
  Administered 2019-02-20 – 2019-02-27 (×8): 10 mg
  Filled 2019-02-19 (×8): qty 1

## 2019-02-19 MED ORDER — FOLIC ACID 1 MG PO TABS
1.0000 mg | ORAL_TABLET | Freq: Every day | ORAL | Status: DC
  Administered 2019-02-20 – 2019-02-27 (×8): 1 mg
  Filled 2019-02-19 (×8): qty 1

## 2019-02-19 MED ORDER — ACETAMINOPHEN 325 MG PO TABS
650.0000 mg | ORAL_TABLET | ORAL | Status: DC | PRN
  Administered 2019-02-25 – 2019-02-26 (×3): 650 mg
  Filled 2019-02-19 (×3): qty 2

## 2019-02-19 NOTE — Evaluation (Signed)
SLP Cancellation Note  Patient Details Name: Austin Mendez MRN: [id number] DOB: 06-06-1960   Cancelled treatment:       Reason Eval/Treat Not Completed: Other (comment)(pt remains on vent Saturday pm)   Macario Golds 02/19/2019, 5:16 PM   Luanna Salk, [facility] SLP Acute Rehab Services Pager [phone] Office [phone]
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
## 2019-02-19 NOTE — Plan of Care (Signed)
°  Problem: Coping: °Goal: Level of anxiety will decrease °Outcome: Progressing °  °
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
## 2019-02-20 ENCOUNTER — Inpatient Hospital Stay (HOSPITAL_COMMUNITY): Payer: Medicaid Other

## 2019-02-20 ENCOUNTER — Encounter (HOSPITAL_COMMUNITY): Payer: Self-pay

## 2019-02-20 LAB — GLUCOSE, CAPILLARY
Glucose-Capillary: 104 mg/dL — ABNORMAL HIGH (ref 70–99)
Glucose-Capillary: 104 mg/dL — ABNORMAL HIGH (ref 70–99)
Glucose-Capillary: 121 mg/dL — ABNORMAL HIGH (ref 70–99)
Glucose-Capillary: 75 mg/dL (ref 70–99)
Glucose-Capillary: 76 mg/dL (ref 70–99)
Glucose-Capillary: 80 mg/dL (ref 70–99)
Glucose-Capillary: 91 mg/dL (ref 70–99)

## 2019-02-20 LAB — TRIGLYCERIDES: Triglycerides: 170 mg/dL — ABNORMAL HIGH (ref <150)

## 2019-02-20 MED ORDER — POLYETHYLENE GLYCOL 3350 17 G PO PACK
17.0000 g | PACK | Freq: Every day | ORAL | Status: DC
  Administered 2019-02-20: 17 g via ORAL
  Filled 2019-02-20: qty 1

## 2019-02-20 MED ORDER — POLYETHYLENE GLYCOL 3350 17 G PO PACK
17.0000 g | PACK | Freq: Every day | ORAL | Status: DC
  Administered 2019-02-21 – 2019-02-27 (×6): 17 g
  Filled 2019-02-20 (×6): qty 1

## 2019-02-20 MED ORDER — PANTOPRAZOLE SODIUM 40 MG IV SOLR
40.0000 mg | INTRAVENOUS | Status: DC
  Administered 2019-02-20 – 2019-02-21 (×2): 40 mg via INTRAVENOUS
  Filled 2019-02-20 (×3): qty 40

## 2019-02-20 MED ORDER — LABETALOL HCL 5 MG/ML IV SOLN
10.0000 mg | INTRAVENOUS | Status: DC | PRN
  Administered 2019-02-20 – 2019-02-21 (×3): 10 mg via INTRAVENOUS
  Filled 2019-02-20 (×3): qty 4

## 2019-02-20 MED ORDER — HYDRALAZINE HCL 20 MG/ML IJ SOLN: 20.0000 mg | INTRAMUSCULAR | Status: DC | PRN
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
## 2019-02-21 ENCOUNTER — Other Ambulatory Visit: Payer: Self-pay

## 2019-02-21 ENCOUNTER — Inpatient Hospital Stay (HOSPITAL_COMMUNITY): Payer: Medicaid Other

## 2019-02-21 LAB — CBC
HCT: 28.5 % — ABNORMAL LOW (ref 39.0–52.0)
Hemoglobin: 8.9 g/dL — ABNORMAL LOW (ref 13.0–17.0)
MCH: 28.9 pg (ref 26.0–34.0)
MCHC: 31.2 g/dL (ref 30.0–36.0)
MCV: 92.5 fL (ref 80.0–100.0)
Platelets: 275 10*3/uL (ref 150–400)
RBC: 3.08 MIL/uL — ABNORMAL LOW (ref 4.22–5.81)
RDW: 19.4 % — ABNORMAL HIGH (ref 11.5–15.5)
WBC: 6.8 10*3/uL (ref 4.0–10.5)
nRBC: 0.4 % — ABNORMAL HIGH (ref 0.0–0.2)

## 2019-02-21 LAB — GLUCOSE, CAPILLARY
Glucose-Capillary: 79 mg/dL (ref 70–99)
Glucose-Capillary: 91 mg/dL (ref 70–99)
Glucose-Capillary: 101 mg/dL — ABNORMAL HIGH (ref 70–99)
Glucose-Capillary: 107 mg/dL — ABNORMAL HIGH (ref 70–99)
Glucose-Capillary: 84 mg/dL (ref 70–99)
Glucose-Capillary: 75 mg/dL (ref 70–99)

## 2019-02-21 LAB — BASIC METABOLIC PANEL
Anion gap: 5 (ref 5–15)
BUN: 17 mg/dL (ref 6–20)
CO2: 20 mmol/L — ABNORMAL LOW (ref 22–32)
Calcium: 8 mg/dL — ABNORMAL LOW (ref 8.9–10.3)
Chloride: 120 mmol/L — ABNORMAL HIGH (ref 98–111)
Creatinine, Ser: 1.11 mg/dL (ref 0.61–1.24)
GFR calc Af Amer: >60 mL/min (ref >60)
GFR calc non Af Amer: >60 mL/min (ref >60)
Glucose, Bld: 94 mg/dL (ref 70–99)
Potassium: 3.7 mmol/L (ref 3.5–5.1)
Sodium: 145 mmol/L (ref 135–145)

## 2019-02-21 LAB — MAGNESIUM: Magnesium: 1.7 mg/dL (ref 1.7–2.4)

## 2019-02-21 LAB — PHOSPHORUS: Phosphorus: 2.4 mg/dL — ABNORMAL LOW (ref 2.5–4.6)

## 2019-02-21 LAB — TRIGLYCERIDES: Triglycerides: 149 mg/dL

## 2019-02-21 MED ORDER — MAGNESIUM SULFATE 2 GM/50ML IV SOLN
2.0000 g | Freq: Once | INTRAVENOUS | Status: AC
  Administered 2019-02-21: 2 g via INTRAVENOUS
  Filled 2019-02-21: qty 50

## 2019-02-21 MED ORDER — NICARDIPINE HCL IN NACL 20-0.86 MG/200ML-% IV SOLN
3.0000 mg/h | INTRAVENOUS | Status: DC
  Administered 2019-02-21: 22:00:00 4 mg/h via INTRAVENOUS
  Administered 2019-02-21: 3 mg/h via INTRAVENOUS
  Administered 2019-02-22: 4 mg/h via INTRAVENOUS
  Filled 2019-02-21 (×4): qty 200

## 2019-02-21 MED ORDER — METOPROLOL TARTRATE 25 MG PO TABS
12.5000 mg | ORAL_TABLET | Freq: Two times a day (BID) | ORAL | Status: DC
  Administered 2019-02-21 (×2): 12.5 mg via ORAL
  Filled 2019-02-21 (×2): qty 1

## 2019-02-21 MED ORDER — POTASSIUM PHOSPHATES 15 MMOLE/5ML IV SOLN
20.0000 mmol | Freq: Once | INTRAVENOUS | Status: AC
  Administered 2019-02-21: 20 mmol via INTRAVENOUS
  Filled 2019-02-21: qty 6.67

## 2019-02-21 MED ORDER — METOPROLOL TARTRATE 5 MG/5ML IV SOLN
5.0000 mg | Freq: Four times a day (QID) | INTRAVENOUS | Status: DC | PRN
  Administered 2019-02-26: 5 mg via INTRAVENOUS
  Filled 2019-02-21 (×2): qty 5
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
## 2019-02-21 NOTE — Plan of Care (Signed)
  Problem: Clinical Measurements: Goal: Cardiovascular complication will be avoided Outcome: Progressing   
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
## 2019-02-21 NOTE — Progress Notes (Signed)
Inpatient Rehab Admissions Coordinator:   Note pt intubated over the weekend.  Will follow from a distance.   Shann Medal, PT, DPT Admissions Coordinator [phone] 02/21/19  10:16 AM
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
## 2019-02-22 ENCOUNTER — Inpatient Hospital Stay (HOSPITAL_COMMUNITY): Payer: Medicaid Other

## 2019-02-22 LAB — CBC
HCT: 24.7 % — ABNORMAL LOW (ref 39.0–52.0)
Hemoglobin: 7.9 g/dL — ABNORMAL LOW (ref 13.0–17.0)
MCH: 28.7 pg (ref 26.0–34.0)
MCHC: 32 g/dL (ref 30.0–36.0)
MCV: 89.8 fL (ref 80.0–100.0)
Platelets: 291 10*3/uL (ref 150–400)
RBC: 2.75 MIL/uL — ABNORMAL LOW (ref 4.22–5.81)
RDW: 19.4 % — ABNORMAL HIGH (ref 11.5–15.5)
WBC: 7.3 10*3/uL (ref 4.0–10.5)
nRBC: 0.3 % — ABNORMAL HIGH (ref 0.0–0.2)

## 2019-02-22 LAB — BASIC METABOLIC PANEL
Anion gap: 16 — ABNORMAL HIGH (ref 5–15)
BUN: 15 mg/dL (ref 6–20)
CO2: 13 mmol/L — ABNORMAL LOW (ref 22–32)
Calcium: 8.3 mg/dL — ABNORMAL LOW (ref 8.9–10.3)
Chloride: 116 mmol/L — ABNORMAL HIGH (ref 98–111)
Creatinine, Ser: 1.07 mg/dL (ref 0.61–1.24)
GFR calc Af Amer: >60 mL/min (ref >60)
GFR calc non Af Amer: >60 mL/min (ref >60)
Glucose, Bld: 96 mg/dL (ref 70–99)
Potassium: 3.7 mmol/L (ref 3.5–5.1)
Sodium: 145 mmol/L (ref 135–145)

## 2019-02-22 LAB — GLUCOSE, CAPILLARY
Glucose-Capillary: 91 mg/dL (ref 70–99)
Glucose-Capillary: 77 mg/dL (ref 70–99)
Glucose-Capillary: 90 mg/dL (ref 70–99)
Glucose-Capillary: 80 mg/dL (ref 70–99)
Glucose-Capillary: 94 mg/dL (ref 70–99)
Glucose-Capillary: 81 mg/dL (ref 70–99)

## 2019-02-22 LAB — TRIGLYCERIDES: Triglycerides: 144 mg/dL (ref <150)

## 2019-02-22 LAB — PHOSPHORUS: Phosphorus: 3.8 mg/dL (ref 2.5–4.6)

## 2019-02-22 LAB — MAGNESIUM: Magnesium: 1.9 mg/dL (ref 1.7–2.4)

## 2019-02-22 MED ORDER — FENTANYL CITRATE (PF) 100 MCG/2ML IJ SOLN: 50.0000 ug | INTRAMUSCULAR | Status: DC | PRN

## 2019-02-22 MED ORDER — MAGNESIUM SULFATE IN D5W 1-5 GM/100ML-% IV SOLN
1.0000 g | Freq: Once | INTRAVENOUS | Status: AC
  Administered 2019-02-22: 1 g via INTRAVENOUS
  Filled 2019-02-22 (×2): qty 100

## 2019-02-22 MED ORDER — FENTANYL 2500MCG IN NS 250ML (10MCG/ML) PREMIX INFUSION
50.0000 ug/h | INTRAVENOUS | Status: DC
  Administered 2019-02-22: 50 ug/h via INTRAVENOUS
  Administered 2019-02-23: 100 ug/h via INTRAVENOUS
  Filled 2019-02-22 (×2): qty 250

## 2019-02-22 MED ORDER — METOPROLOL TARTRATE 25 MG PO TABS
25.0000 mg | ORAL_TABLET | Freq: Two times a day (BID) | ORAL | Status: DC
  Administered 2019-02-22 – 2019-02-23 (×3): 25 mg via ORAL
  Filled 2019-02-22 (×4): qty 1

## 2019-02-22 MED ORDER — SODIUM CHLORIDE 0.9 % IV SOLN
INTRAVENOUS | Status: DC | PRN
  Administered 2019-02-22 – 2019-02-25 (×3): 250 mL via INTRAVENOUS

## 2019-02-22 MED ORDER — PANTOPRAZOLE SODIUM 40 MG PO PACK
40.0000 mg | PACK | Freq: Every day | ORAL | Status: DC
  Administered 2019-02-22 – 2019-02-27 (×6): 40 mg
  Filled 2019-02-22 (×6): qty 20
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
## 2019-02-22 NOTE — Progress Notes (Signed)
SLP Cancellation Note  Patient Details Name: Austin Mendez MRN: [id number] DOB: 29-Jun-1960   Cancelled treatment:       Reason Eval/Treat Not Completed: Patient not medically ready   Venita Sheffield Rashaad Hallstrom 02/22/2019, 7:18 AM  Pollyann Glen, M.A. [place] Acute Environmental education officer [phone] Office [phone]

## 2019-02-22 NOTE — Progress Notes (Signed)
Orthopedic Tech Progress Note Patient Details:  Austin Mendez 12/25/1960 [id number]  Ortho Devices Type of Ortho Device: Prafo boot/shoe Ortho Device/Splint Location: RLE Ortho Device/Splint Interventions: Application, Ordered   Post Interventions Patient Tolerated: Fair Instructions Provided: Care of device, Adjustment of device   [name] 02/22/2019, 12:12 PM

## 2019-02-22 NOTE — Progress Notes (Signed)
Pt transported to CT and back. No complications noted. Vitals stable.

## 2019-02-22 NOTE — Progress Notes (Signed)
Patient was transported to MRI & back to 4N20 without any complications.  
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
## 2019-02-22 DEATH — deceased

## 2019-02-23 ENCOUNTER — Inpatient Hospital Stay: Payer: Self-pay

## 2019-02-23 ENCOUNTER — Inpatient Hospital Stay (HOSPITAL_COMMUNITY): Payer: Medicaid Other

## 2019-02-23 DIAGNOSIS — G92 Toxic encephalopathy: Secondary | ICD-10-CM | POA: Diagnosis not present

## 2019-02-23 DIAGNOSIS — R4182 Altered mental status, unspecified: Secondary | ICD-10-CM

## 2019-02-23 LAB — CBC
HCT: 27.2 % — ABNORMAL LOW (ref 39.0–52.0)
Hemoglobin: 8.6 g/dL — ABNORMAL LOW (ref 13.0–17.0)
MCH: 28.8 pg (ref 26.0–34.0)
MCHC: 31.6 g/dL (ref 30.0–36.0)
MCV: 91 fL (ref 80.0–100.0)
Platelets: 287 10*3/uL (ref 150–400)
RBC: 2.99 MIL/uL — ABNORMAL LOW (ref 4.22–5.81)
RDW: 19.8 % — ABNORMAL HIGH (ref 11.5–15.5)
WBC: 7.1 10*3/uL (ref 4.0–10.5)
nRBC: 0.4 % — ABNORMAL HIGH (ref 0.0–0.2)

## 2019-02-23 LAB — MAGNESIUM: Magnesium: 2 mg/dL (ref 1.7–2.4)

## 2019-02-23 LAB — BASIC METABOLIC PANEL
Anion gap: 9 (ref 5–15)
BUN: 21 mg/dL — ABNORMAL HIGH (ref 6–20)
CO2: 21 mmol/L — ABNORMAL LOW (ref 22–32)
Calcium: 8.5 mg/dL — ABNORMAL LOW (ref 8.9–10.3)
Chloride: 115 mmol/L — ABNORMAL HIGH (ref 98–111)
Creatinine, Ser: 1.2 mg/dL (ref 0.61–1.24)
GFR calc Af Amer: >60 mL/min (ref >60)
GFR calc non Af Amer: >60 mL/min (ref >60)
Glucose, Bld: 89 mg/dL (ref 70–99)
Potassium: 3.8 mmol/L (ref 3.5–5.1)
Sodium: 145 mmol/L (ref 135–145)

## 2019-02-23 LAB — GLUCOSE, CAPILLARY
Glucose-Capillary: 77 mg/dL (ref 70–99)
Glucose-Capillary: 68 mg/dL — ABNORMAL LOW (ref 70–99)
Glucose-Capillary: 114 mg/dL — ABNORMAL HIGH (ref 70–99)
Glucose-Capillary: 79 mg/dL (ref 70–99)
Glucose-Capillary: 83 mg/dL (ref 70–99)
Glucose-Capillary: 74 mg/dL (ref 70–99)
Glucose-Capillary: 90 mg/dL (ref 70–99)

## 2019-02-23 LAB — HEPATIC FUNCTION PANEL
ALT: 43 U/L (ref 0–44)
AST: 63 U/L — ABNORMAL HIGH (ref 15–41)
Albumin: 1.8 g/dL — ABNORMAL LOW (ref 3.5–5.0)
Alkaline Phosphatase: 82 U/L (ref 38–126)
Bilirubin, Direct: 1.3 mg/dL — ABNORMAL HIGH (ref 0.0–0.2)
Indirect Bilirubin: 0.7 mg/dL (ref 0.3–0.9)
Total Bilirubin: 2 mg/dL — ABNORMAL HIGH (ref 0.3–1.2)
Total Protein: 6.9 g/dL (ref 6.5–8.1)

## 2019-02-23 LAB — T4, FREE: Free T4: 0.75 ng/dL (ref 0.61–1.12)

## 2019-02-23 LAB — PHOSPHORUS: Phosphorus: 3.9 mg/dL (ref 2.5–4.6)

## 2019-02-23 LAB — TSH: TSH: 2.127 u[IU]/mL (ref 0.350–4.500)

## 2019-02-23 LAB — AMMONIA: Ammonia: 28 umol/L (ref 9–35)

## 2019-02-23 LAB — TRIGLYCERIDES: Triglycerides: 186 mg/dL — ABNORMAL HIGH (ref <150)

## 2019-02-23 MED ORDER — METOPROLOL TARTRATE 25 MG PO TABS
25.0000 mg | ORAL_TABLET | Freq: Two times a day (BID) | ORAL | Status: DC
  Administered 2019-02-23 – 2019-02-27 (×8): 25 mg
  Filled 2019-02-23 (×7): qty 1

## 2019-02-23 MED ORDER — CLONAZEPAM 0.5 MG PO TBDP
1.0000 mg | ORAL_TABLET | Freq: Three times a day (TID) | ORAL | Status: DC
  Administered 2019-02-23 – 2019-02-27 (×11): 1 mg
  Filled 2019-02-23 (×10): qty 2

## 2019-02-23 MED ORDER — THIAMINE HCL 100 MG/ML IJ SOLN
500.0000 mg | Freq: Three times a day (TID) | INTRAVENOUS | Status: AC
  Administered 2019-02-23 – 2019-02-26 (×9): 500 mg via INTRAVENOUS
  Filled 2019-02-23 (×10): qty 5

## 2019-02-23 MED ORDER — SODIUM CHLORIDE 0.9% FLUSH
10.0000 mL | Freq: Two times a day (BID) | INTRAVENOUS | Status: DC
  Administered 2019-02-24 – 2019-02-27 (×7): 10 mL

## 2019-02-23 MED ORDER — CHLORHEXIDINE GLUCONATE CLOTH 2 % EX PADS
6.0000 | MEDICATED_PAD | Freq: Every day | CUTANEOUS | Status: DC
  Administered 2019-02-24 – 2019-02-26 (×3): 6 via TOPICAL

## 2019-02-23 MED ORDER — DEXTROSE 50 % IV SOLN
INTRAVENOUS | Status: AC
  Administered 2019-02-23: 25 mL
  Filled 2019-02-23: qty 50

## 2019-02-23 MED ORDER — CLONAZEPAM 0.5 MG PO TBDP
1.0000 mg | ORAL_TABLET | Freq: Three times a day (TID) | ORAL | Status: DC
  Administered 2019-02-23 (×2): 1 mg via ORAL
  Filled 2019-02-23 (×3): qty 2

## 2019-02-23 MED ORDER — DEXMEDETOMIDINE HCL IN NACL 400 MCG/100ML IV SOLN
0.1000 ug/kg/h | INTRAVENOUS | Status: DC
  Administered 2019-02-23: 0.6 ug/kg/h via INTRAVENOUS
  Administered 2019-02-23: 0.4 ug/kg/h via INTRAVENOUS
  Administered 2019-02-24: 0.7 ug/kg/h via INTRAVENOUS
  Administered 2019-02-24 (×2): 0.6 ug/kg/h via INTRAVENOUS
  Administered 2019-02-25: 0.7 ug/kg/h via INTRAVENOUS
  Administered 2019-02-25: 0.4 ug/kg/h via INTRAVENOUS
  Administered 2019-02-26 – 2019-02-27 (×4): 0.6 ug/kg/h via INTRAVENOUS
  Filled 2019-02-23 (×13): qty 100

## 2019-02-23 MED ORDER — SODIUM CHLORIDE 0.9% FLUSH: 10.0000 mL | INTRAVENOUS | Status: DC | PRN

## 2019-02-23 NOTE — Progress Notes (Signed)
Peripherally Inserted Central Catheter/Midline Placement  The IV Nurse has discussed with the patient and/or persons authorized to consent for the patient, the purpose of this procedure and the potential benefits and risks involved with this procedure.  The benefits include less needle sticks, lab draws from the catheter, and the patient may be discharged home with the catheter. Risks include, but not limited to, infection, bleeding, blood clot (thrombus formation), and puncture of an artery; nerve damage and irregular heartbeat and possibility to perform a PICC exchange if needed/ordered by physician.  Alternatives to this procedure were also discussed.  Bard Power PICC patient education guide, fact sheet on infection prevention and patient information card has been provided to patient /or left at bedside.    PICC/Midline Placement Documentation  PICC Triple Lumen 02/23/19 PICC Left Brachial 48 cm 0 cm (Active)  Indication for Insertion or Continuance of Line Poor Vasculature-patient has had multiple peripheral attempts or PIVs lasting less than 24 hours 02/23/19 2047  Exposed Catheter (cm) 0 cm 02/23/19 2047  Site Assessment Clean;Dry;Intact 02/23/19 2047  Lumen #1 Status Blood return noted;Flushed;Saline locked 02/23/19 2047  Lumen #2 Status Blood return noted;Flushed;Saline locked 02/23/19 2047  Lumen #3 Status Blood return noted;Flushed;Saline locked 02/23/19 2047  Dressing Type Transparent;Occlusive;Securing device 02/23/19 2047  Dressing Status Clean;Dry;Intact;Antimicrobial disc in place;Other (Comment) 02/23/19 2047  Line Adjustment (NICU/IV Team Only) No 02/23/19 2047  Dressing Intervention New dressing 02/23/19 2047  Dressing Change Due 03/02/19 02/23/19 2047       Edson Snowball 02/23/2019, 9:13 PM

## 2019-02-23 NOTE — Progress Notes (Signed)
Lab attempted to draw blood twice for ordered labs and were unsuccessful. MD made aware once out of OR. Fayelynn Distel, Rande Brunt, RN
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
## 2019-02-23 NOTE — Procedures (Signed)
Patient Name: DERICO MITTON  MRN: [id number]  Epilepsy Attending: Lora Havens  Referring Physician/Provider: Dr. Reather Laurence  Date: 02/23/2019 Duration: 25.09 minutes  Patient history: 58 year old male with altered mental status.  EEG to evaluate for seizures.  Level of alertness: Awake  AEDs during EEG study: Klonopin  Technical aspects: This EEG study was done with scalp electrodes positioned according to the 10-20 International system of electrode placement. Electrical activity was acquired at a sampling rate of 500Hz  and reviewed with a high frequency filter of 70Hz  and a low frequency filter of 1Hz . EEG data were recorded continuously and digitally stored.   Description: During awake state, no clear posterior dominant rhythm was seen.  EEG showed continuous generalized 6 to 7 Hz theta slowing admixed with 15 to 18 Hz beta activity,  distributed symmetrically and diffusely.  Photic stimulation and hyperventilation was not performed.  Abnormality -Continuous slow, generalized     IMPRESSION: This study is suggestive of moderate diffuse encephalopathy, nonspecific to etiology but could be secondary to sedation. No seizures or epileptiform discharges were seen throughout the recording.  Lindsee Labarre Barbra Sarks

## 2019-02-23 NOTE — Progress Notes (Signed)
EEG complete - results pending 

## 2019-02-24 DIAGNOSIS — G92 Toxic encephalopathy: Secondary | ICD-10-CM | POA: Diagnosis not present

## 2019-02-24 DIAGNOSIS — Z789 Other specified health status: Secondary | ICD-10-CM

## 2019-02-24 DIAGNOSIS — Z7409 Other reduced mobility: Secondary | ICD-10-CM

## 2019-02-24 DIAGNOSIS — S8254XA Nondisplaced fracture of medial malleolus of right tibia, initial encounter for closed fracture: Secondary | ICD-10-CM

## 2019-02-24 LAB — GLUCOSE, CAPILLARY
Glucose-Capillary: 94 mg/dL (ref 70–99)
Glucose-Capillary: 82 mg/dL (ref 70–99)
Glucose-Capillary: 105 mg/dL — ABNORMAL HIGH (ref 70–99)
Glucose-Capillary: 120 mg/dL — ABNORMAL HIGH (ref 70–99)
Glucose-Capillary: 123 mg/dL — ABNORMAL HIGH (ref 70–99)
Glucose-Capillary: 121 mg/dL — ABNORMAL HIGH (ref 70–99)

## 2019-02-24 LAB — CBC
HCT: 27.4 % — ABNORMAL LOW (ref 39.0–52.0)
Hemoglobin: 8.5 g/dL — ABNORMAL LOW (ref 13.0–17.0)
MCH: 28.4 pg (ref 26.0–34.0)
MCHC: 31 g/dL (ref 30.0–36.0)
MCV: 91.6 fL (ref 80.0–100.0)
Platelets: 299 10*3/uL (ref 150–400)
RBC: 2.99 MIL/uL — ABNORMAL LOW (ref 4.22–5.81)
RDW: 19.6 % — ABNORMAL HIGH (ref 11.5–15.5)
WBC: 9.2 10*3/uL (ref 4.0–10.5)
nRBC: 0.3 % — ABNORMAL HIGH (ref 0.0–0.2)

## 2019-02-24 LAB — BASIC METABOLIC PANEL
Anion gap: 9 (ref 5–15)
BUN: 27 mg/dL — ABNORMAL HIGH (ref 6–20)
CO2: 19 mmol/L — ABNORMAL LOW (ref 22–32)
Calcium: 8.5 mg/dL — ABNORMAL LOW (ref 8.9–10.3)
Chloride: 116 mmol/L — ABNORMAL HIGH (ref 98–111)
Creatinine, Ser: 1.14 mg/dL (ref 0.61–1.24)
GFR calc Af Amer: >60 mL/min (ref >60)
GFR calc non Af Amer: >60 mL/min (ref >60)
Glucose, Bld: 126 mg/dL — ABNORMAL HIGH (ref 70–99)
Potassium: 3.8 mmol/L (ref 3.5–5.1)
Sodium: 144 mmol/L (ref 135–145)

## 2019-02-24 LAB — PHOSPHORUS: Phosphorus: 3.8 mg/dL (ref 2.5–4.6)

## 2019-02-24 LAB — VITAMIN B12: Vitamin B-12: 224 pg/mL (ref 180–914)

## 2019-02-24 LAB — MAGNESIUM: Magnesium: 1.9 mg/dL (ref 1.7–2.4)

## 2019-02-24 MED ORDER — PIVOT 1.5 CAL PO LIQD
1000.0000 mL | ORAL | Status: DC
  Administered 2019-02-24 – 2019-02-27 (×3): 1000 mL
  Filled 2019-02-24 (×2): qty 1000

## 2019-02-24 MED ORDER — CLONIDINE HCL 0.1 MG PO TABS: 0.1000 mg | ORAL_TABLET | Freq: Three times a day (TID) | ORAL | Status: DC

## 2019-02-24 MED ORDER — CLONIDINE HCL 0.1 MG PO TABS
0.1000 mg | ORAL_TABLET | Freq: Three times a day (TID) | ORAL | Status: DC
  Administered 2019-02-24 – 2019-02-25 (×3): 0.1 mg via ORAL
  Filled 2019-02-24 (×3): qty 1
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
## 2019-02-24 NOTE — Progress Notes (Addendum)
SLP Cancellation Note  Patient Details Name: Austin Mendez MRN: [id number] DOB: 02-Jun-1960   Cancelled treatment:        Remains intubated. ST will discharge- please reorder when/if pt becomes appropriate.   Houston Siren 02/24/2019, 10:30 AM  Orbie Pyo Colvin Caroli.Ed Risk analyst [phone] Office [phone]

## 2019-02-24 NOTE — Progress Notes (Signed)
Inpatient Rehab Admissions Coordinator:   Discussed in trauma rounds.  Poor prognosis for vent wean, palliative care consult.  Will sign off at this time.   Shann Medal, PT, DPT Admissions Coordinator [phone] 02/24/19  3:34 PM
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
## 2019-02-25 DIAGNOSIS — J9690 Respiratory failure, unspecified, unspecified whether with hypoxia or hypercapnia: Secondary | ICD-10-CM

## 2019-02-25 DIAGNOSIS — Z515 Encounter for palliative care: Secondary | ICD-10-CM | POA: Diagnosis not present

## 2019-02-25 DIAGNOSIS — Z7189 Other specified counseling: Secondary | ICD-10-CM | POA: Diagnosis not present

## 2019-02-25 DIAGNOSIS — J9600 Acute respiratory failure, unspecified whether with hypoxia or hypercapnia: Secondary | ICD-10-CM | POA: Diagnosis not present

## 2019-02-25 DIAGNOSIS — G92 Toxic encephalopathy: Secondary | ICD-10-CM | POA: Diagnosis not present

## 2019-02-25 LAB — GLUCOSE, CAPILLARY
Glucose-Capillary: 107 mg/dL — ABNORMAL HIGH (ref 70–99)
Glucose-Capillary: 106 mg/dL — ABNORMAL HIGH (ref 70–99)
Glucose-Capillary: 121 mg/dL — ABNORMAL HIGH (ref 70–99)
Glucose-Capillary: 94 mg/dL (ref 70–99)
Glucose-Capillary: 94 mg/dL (ref 70–99)
Glucose-Capillary: 107 mg/dL — ABNORMAL HIGH (ref 70–99)

## 2019-02-25 LAB — CBC
HCT: 26.8 % — ABNORMAL LOW (ref 39.0–52.0)
Hemoglobin: 8.3 g/dL — ABNORMAL LOW (ref 13.0–17.0)
MCH: 28.5 pg (ref 26.0–34.0)
MCHC: 31 g/dL (ref 30.0–36.0)
MCV: 92.1 fL (ref 80.0–100.0)
Platelets: 309 10*3/uL (ref 150–400)
RBC: 2.91 MIL/uL — ABNORMAL LOW (ref 4.22–5.81)
RDW: 20 % — ABNORMAL HIGH (ref 11.5–15.5)
WBC: 11.4 10*3/uL — ABNORMAL HIGH (ref 4.0–10.5)
nRBC: 0.4 % — ABNORMAL HIGH (ref 0.0–0.2)

## 2019-02-25 LAB — BASIC METABOLIC PANEL
Anion gap: 7 (ref 5–15)
BUN: 30 mg/dL — ABNORMAL HIGH (ref 6–20)
CO2: 22 mmol/L (ref 22–32)
Calcium: 8.5 mg/dL — ABNORMAL LOW (ref 8.9–10.3)
Chloride: 117 mmol/L — ABNORMAL HIGH (ref 98–111)
Creatinine, Ser: 1.12 mg/dL (ref 0.61–1.24)
GFR calc Af Amer: >60 mL/min (ref >60)
GFR calc non Af Amer: >60 mL/min (ref >60)
Glucose, Bld: 141 mg/dL — ABNORMAL HIGH (ref 70–99)
Potassium: 3.4 mmol/L — ABNORMAL LOW (ref 3.5–5.1)
Sodium: 146 mmol/L — ABNORMAL HIGH (ref 135–145)

## 2019-02-25 LAB — T3, FREE: T3, Free: 2.8 pg/mL (ref 2.0–4.4)

## 2019-02-25 LAB — MAGNESIUM: Magnesium: 1.9 mg/dL (ref 1.7–2.4)

## 2019-02-25 LAB — PHOSPHORUS: Phosphorus: 2.3 mg/dL — ABNORMAL LOW (ref 2.5–4.6)

## 2019-02-25 MED ORDER — SODIUM CHLORIDE 0.9 % IV SOLN
INTRAVENOUS | Status: DC | PRN
  Administered 2019-02-25: 250 mL via INTRAVENOUS

## 2019-02-25 MED ORDER — POTASSIUM PHOSPHATES 15 MMOLE/5ML IV SOLN
20.0000 mmol | Freq: Once | INTRAVENOUS | Status: AC
  Administered 2019-02-25: 20 mmol via INTRAVENOUS
  Filled 2019-02-25: qty 6.67

## 2019-02-25 MED ORDER — MAGNESIUM SULFATE 2 GM/50ML IV SOLN
2.0000 g | Freq: Once | INTRAVENOUS | Status: AC
  Administered 2019-02-25: 2 g via INTRAVENOUS
  Filled 2019-02-25: qty 50

## 2019-02-25 MED ORDER — CLONIDINE HCL 0.2 MG PO TABS
0.2000 mg | ORAL_TABLET | Freq: Three times a day (TID) | ORAL | Status: DC
  Administered 2019-02-25 – 2019-02-26 (×3): 0.2 mg via ORAL
  Filled 2019-02-25 (×3): qty 1

## 2019-02-25 MED ORDER — QUETIAPINE FUMARATE 25 MG PO TABS
50.0000 mg | ORAL_TABLET | Freq: Two times a day (BID) | ORAL | Status: DC
  Administered 2019-02-25 (×2): 50 mg via ORAL
  Filled 2019-02-25 (×2): qty 2
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
## 2019-02-26 ENCOUNTER — Inpatient Hospital Stay (HOSPITAL_COMMUNITY): Payer: Medicaid Other

## 2019-02-26 DIAGNOSIS — Z66 Do not resuscitate: Secondary | ICD-10-CM

## 2019-02-26 DIAGNOSIS — J9600 Acute respiratory failure, unspecified whether with hypoxia or hypercapnia: Secondary | ICD-10-CM | POA: Diagnosis not present

## 2019-02-26 DIAGNOSIS — R0602 Shortness of breath: Secondary | ICD-10-CM

## 2019-02-26 LAB — BASIC METABOLIC PANEL
Anion gap: 7 (ref 5–15)
BUN: 31 mg/dL — ABNORMAL HIGH (ref 6–20)
CO2: 22 mmol/L (ref 22–32)
Calcium: 8.3 mg/dL — ABNORMAL LOW (ref 8.9–10.3)
Chloride: 119 mmol/L — ABNORMAL HIGH (ref 98–111)
Creatinine, Ser: 1.12 mg/dL (ref 0.61–1.24)
GFR calc Af Amer: >60 mL/min (ref >60)
GFR calc non Af Amer: >60 mL/min (ref >60)
Glucose, Bld: 121 mg/dL — ABNORMAL HIGH (ref 70–99)
Potassium: 3.4 mmol/L — ABNORMAL LOW (ref 3.5–5.1)
Sodium: 148 mmol/L — ABNORMAL HIGH (ref 135–145)

## 2019-02-26 LAB — GLUCOSE, CAPILLARY
Glucose-Capillary: 104 mg/dL — ABNORMAL HIGH (ref 70–99)
Glucose-Capillary: 89 mg/dL (ref 70–99)
Glucose-Capillary: 91 mg/dL (ref 70–99)
Glucose-Capillary: 121 mg/dL — ABNORMAL HIGH (ref 70–99)
Glucose-Capillary: 114 mg/dL — ABNORMAL HIGH (ref 70–99)
Glucose-Capillary: 123 mg/dL — ABNORMAL HIGH (ref 70–99)

## 2019-02-26 LAB — CBC
HCT: 25.6 % — ABNORMAL LOW (ref 39.0–52.0)
Hemoglobin: 8.2 g/dL — ABNORMAL LOW (ref 13.0–17.0)
MCH: 29.4 pg (ref 26.0–34.0)
MCHC: 32 g/dL (ref 30.0–36.0)
MCV: 91.8 fL (ref 80.0–100.0)
Platelets: 270 10*3/uL (ref 150–400)
RBC: 2.79 MIL/uL — ABNORMAL LOW (ref 4.22–5.81)
RDW: 20.6 % — ABNORMAL HIGH (ref 11.5–15.5)
WBC: 10.9 10*3/uL — ABNORMAL HIGH (ref 4.0–10.5)
nRBC: 0.5 % — ABNORMAL HIGH (ref 0.0–0.2)

## 2019-02-26 LAB — PHOSPHORUS: Phosphorus: 3.8 mg/dL (ref 2.5–4.6)

## 2019-02-26 LAB — MAGNESIUM: Magnesium: 2 mg/dL (ref 1.7–2.4)

## 2019-02-26 MED ORDER — CLONIDINE HCL 0.2 MG PO TABS
0.2000 mg | ORAL_TABLET | Freq: Three times a day (TID) | ORAL | Status: DC
  Administered 2019-02-26 – 2019-02-27 (×3): 0.2 mg
  Filled 2019-02-26 (×3): qty 1

## 2019-02-26 MED ORDER — QUETIAPINE FUMARATE 25 MG PO TABS
50.0000 mg | ORAL_TABLET | Freq: Two times a day (BID) | ORAL | Status: DC
  Administered 2019-02-26 – 2019-02-27 (×3): 50 mg
  Filled 2019-02-26 (×3): qty 2

## 2019-02-26 MED ORDER — POTASSIUM CHLORIDE 20 MEQ/15ML (10%) PO SOLN
40.0000 meq | Freq: Once | ORAL | Status: AC
  Administered 2019-02-26: 40 meq
  Filled 2019-02-26: qty 30

## 2019-02-26 NOTE — Plan of Care (Signed)
C spine MRI with no significant traumatic concern. Multilevel DJD, somewhat advanced for age but not concerning for cord injury. Continue supportive care and medical management per primary team Recs as in progress note from yesterday. Will need time, as he has in the past due to protracted EtOH withdrawal and optimizing of pain/sedating meds along with treatment for Etoh withdrawal.  Neurology will be available as needed. Please call with questions.  -- Amie [place], MD [facility] Pager: [phone] If 7pm to 7am, please call on call as listed on AMION.
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
## 2019-02-26 NOTE — Progress Notes (Signed)
Patient transported to and from MRI on 388% w/o complications.
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
## 2019-02-26 NOTE — Progress Notes (Signed)
Patient's wife called for update. She plans to come in this afternoon with patient's sister and make decision regarding goals of care. This nurse made CDS referral ([phone]). This nurse contacted trauma MD to inform.

## 2019-02-27 DIAGNOSIS — Z515 Encounter for palliative care: Secondary | ICD-10-CM | POA: Diagnosis not present

## 2019-02-27 DIAGNOSIS — Z66 Do not resuscitate: Secondary | ICD-10-CM | POA: Diagnosis not present

## 2019-02-27 DIAGNOSIS — R0603 Acute respiratory distress: Secondary | ICD-10-CM

## 2019-02-27 LAB — BASIC METABOLIC PANEL
Anion gap: 6 (ref 5–15)
BUN: 35 mg/dL — ABNORMAL HIGH (ref 6–20)
CO2: 22 mmol/L (ref 22–32)
Calcium: 8.3 mg/dL — ABNORMAL LOW (ref 8.9–10.3)
Chloride: 121 mmol/L — ABNORMAL HIGH (ref 98–111)
Creatinine, Ser: 1.14 mg/dL (ref 0.61–1.24)
GFR calc Af Amer: >60 mL/min (ref >60)
GFR calc non Af Amer: >60 mL/min (ref >60)
Glucose, Bld: 143 mg/dL — ABNORMAL HIGH (ref 70–99)
Potassium: 3.6 mmol/L (ref 3.5–5.1)
Sodium: 149 mmol/L — ABNORMAL HIGH (ref 135–145)

## 2019-02-27 LAB — CBC
HCT: 24.2 % — ABNORMAL LOW (ref 39.0–52.0)
Hemoglobin: 7.7 g/dL — ABNORMAL LOW (ref 13.0–17.0)
MCH: 28.8 pg (ref 26.0–34.0)
MCHC: 31.8 g/dL (ref 30.0–36.0)
MCV: 90.6 fL (ref 80.0–100.0)
Platelets: 273 10*3/uL (ref 150–400)
RBC: 2.67 MIL/uL — ABNORMAL LOW (ref 4.22–5.81)
RDW: 21.1 % — ABNORMAL HIGH (ref 11.5–15.5)
WBC: 11.6 10*3/uL — ABNORMAL HIGH (ref 4.0–10.5)
nRBC: 0.4 % — ABNORMAL HIGH (ref 0.0–0.2)

## 2019-02-27 LAB — GLUCOSE, CAPILLARY
Glucose-Capillary: 108 mg/dL — ABNORMAL HIGH (ref 70–99)
Glucose-Capillary: 101 mg/dL — ABNORMAL HIGH (ref 70–99)

## 2019-02-27 LAB — PHOSPHORUS: Phosphorus: 3.2 mg/dL (ref 2.5–4.6)

## 2019-02-27 LAB — MAGNESIUM: Magnesium: 2 mg/dL (ref 1.7–2.4)

## 2019-02-27 MED ORDER — HALOPERIDOL 0.5 MG PO TABS
0.5000 mg | ORAL_TABLET | ORAL | Status: DC | PRN
  Filled 2019-02-27: qty 1

## 2019-02-27 MED ORDER — GLYCOPYRROLATE 1 MG PO TABS: 1.0000 mg | ORAL_TABLET | ORAL | Status: DC | PRN

## 2019-02-27 MED ORDER — GLYCOPYRROLATE 0.2 MG/ML IJ SOLN: 0.2000 mg | INTRAMUSCULAR | Status: DC | PRN

## 2019-02-27 MED ORDER — DIPHENHYDRAMINE HCL 50 MG/ML IJ SOLN: 25.0000 mg | INTRAMUSCULAR | Status: DC | PRN

## 2019-02-27 MED ORDER — GLYCOPYRROLATE 0.2 MG/ML IJ SOLN
0.4000 mg | Freq: Three times a day (TID) | INTRAMUSCULAR | Status: DC
  Administered 2019-02-27 (×2): 0.4 mg via INTRAVENOUS
  Filled 2019-02-27: qty 2

## 2019-02-27 MED ORDER — SODIUM CHLORIDE 0.9% FLUSH
3.0000 mL | Freq: Two times a day (BID) | INTRAVENOUS | Status: DC
  Administered 2019-02-27: 3 mL via INTRAVENOUS

## 2019-02-27 MED ORDER — HYDROMORPHONE BOLUS VIA INFUSION: 1.0000 mg | INTRAVENOUS | Status: DC | PRN

## 2019-02-27 MED ORDER — POLYVINYL ALCOHOL 1.4 % OP SOLN
1.0000 [drp] | Freq: Four times a day (QID) | OPHTHALMIC | Status: DC | PRN
  Filled 2019-02-27: qty 15

## 2019-02-27 MED ORDER — SODIUM CHLORIDE 0.9 % IV SOLN
1.0000 mg/h | INTRAVENOUS | Status: DC
  Administered 2019-02-27 – 2019-02-28 (×2): 4 mg/h via INTRAVENOUS
  Filled 2019-02-27 (×2): qty 5

## 2019-02-27 MED ORDER — SODIUM CHLORIDE 0.9 % IV SOLN: 250.0000 mL | INTRAVENOUS | Status: DC | PRN

## 2019-02-27 MED ORDER — SODIUM CHLORIDE 0.9% FLUSH: 3.0000 mL | INTRAVENOUS | Status: DC | PRN

## 2019-02-27 MED ORDER — ONDANSETRON HCL 4 MG/2ML IJ SOLN: 4.0000 mg | Freq: Four times a day (QID) | INTRAMUSCULAR | Status: DC | PRN

## 2019-02-27 MED ORDER — ONDANSETRON 4 MG PO TBDP: 4.0000 mg | ORAL_TABLET | Freq: Four times a day (QID) | ORAL | Status: DC | PRN

## 2019-02-27 MED ORDER — HALOPERIDOL LACTATE 5 MG/ML IJ SOLN: 0.5000 mg | INTRAMUSCULAR | Status: DC | PRN

## 2019-02-27 MED ORDER — FREE WATER
200.0000 mL | Freq: Three times a day (TID) | Status: DC
  Administered 2019-02-27: 200 mL

## 2019-02-27 MED ORDER — DEXTROSE 5 % IV SOLN
INTRAVENOUS | Status: DC
  Administered 2019-02-27: 11:00:00 via INTRAVENOUS

## 2019-02-27 MED ORDER — HALOPERIDOL LACTATE 2 MG/ML PO CONC
0.5000 mg | ORAL | Status: DC | PRN
  Administered 2019-02-27: 0.5 mg via SUBLINGUAL
  Filled 2019-02-27 (×2): qty 0.3

## 2019-02-27 MED ORDER — HYDROMORPHONE BOLUS VIA INFUSION
1.0000 mg | INTRAVENOUS | Status: DC | PRN
  Filled 2019-02-27: qty 2

## 2019-02-27 MED ORDER — BIOTENE DRY MOUTH MT LIQD: 15.0000 mL | OROMUCOSAL | Status: DC | PRN

## 2019-02-27 MED ORDER — GLYCOPYRROLATE 0.2 MG/ML IJ SOLN
0.2000 mg | INTRAMUSCULAR | Status: DC | PRN
  Administered 2019-02-27: 0.2 mg via INTRAVENOUS
  Filled 2019-02-27: qty 1

## 2019-02-27 MED ORDER — LORAZEPAM 2 MG/ML IJ SOLN
1.0000 mg | INTRAMUSCULAR | Status: DC | PRN
  Administered 2019-02-27 (×2): 2 mg via INTRAVENOUS
  Filled 2019-02-27 (×2): qty 1

## 2019-02-27 NOTE — Progress Notes (Signed)
Palliative care NP updated me regarding ongoing family discussions. Wife indicated to palliative NP that pt would not want to be on vent this long nor a tracheostomy. They had had those discussions amongst themselves. Wife is requesting extubation and switch to comfort care.   I do no think that is unreasonable.   Austin Mendez. Redmond Pulling, MD, FACS General, Bariatric, & Minimally Invasive Surgery [facility], [place]
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
## 2019-02-27 NOTE — Procedures (Signed)
Extubation Procedure Note  Patient Details:   Name: Austin Mendez DOB: Aug 29, 1960 MRN: [id number]   Airway Documentation:    Vent end date: 02/27/19 Vent end time: 1131   Evaluation  O2 sats: stable throughout Complications: No apparent complications Patient did tolerate procedure well. Bilateral Breath Sounds: Rhonchi   No   Terminal extubation done at this time. RN at bedside.   Saunders Glance 02/27/2019, 11:31 AM

## 2019-03-01 ENCOUNTER — Encounter (HOSPITAL_COMMUNITY): Payer: Self-pay | Admitting: Emergency Medicine

## 2019-03-02 NOTE — Op Note (Signed)
Dictation [phone]
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
## 2019-03-25 NOTE — Progress Notes (Signed)
Pt expired at 0101 and declared dead by both myself and Sydnee Levans. Medical exaimer Izora Ribas notified. Patient will be a medical examiner case.

## 2019-03-25 NOTE — Progress Notes (Signed)
8ml of Dilaudid 50mg  in 169ml of NS wasted in sink. Witness by Sylvan Cheese RN.

## 2019-03-25 DEATH — deceased
